# Patient Record
Sex: Male | Born: 1955 | Race: White | Hispanic: No | Marital: Married | Smoking: Former smoker
Health system: Southern US, Community
[De-identification: ages and names within clinical notes are randomized; demographics above are authoritative.]

## PROBLEM LIST (undated history)

## (undated) DIAGNOSIS — Z978 Presence of other specified devices: Secondary | ICD-10-CM

## (undated) DIAGNOSIS — G4700 Insomnia, unspecified: Secondary | ICD-10-CM

## (undated) DIAGNOSIS — K219 Gastro-esophageal reflux disease without esophagitis: Secondary | ICD-10-CM

## (undated) DIAGNOSIS — E785 Hyperlipidemia, unspecified: Secondary | ICD-10-CM

## (undated) DIAGNOSIS — N401 Enlarged prostate with lower urinary tract symptoms: Secondary | ICD-10-CM

## (undated) DIAGNOSIS — R338 Other retention of urine: Secondary | ICD-10-CM

## (undated) DIAGNOSIS — I2510 Atherosclerotic heart disease of native coronary artery without angina pectoris: Secondary | ICD-10-CM

## (undated) DIAGNOSIS — I10 Essential (primary) hypertension: Secondary | ICD-10-CM

## (undated) DIAGNOSIS — C679 Malignant neoplasm of bladder, unspecified: Secondary | ICD-10-CM

## (undated) DIAGNOSIS — Z960 Presence of urogenital implants: Secondary | ICD-10-CM

## (undated) DIAGNOSIS — E119 Type 2 diabetes mellitus without complications: Secondary | ICD-10-CM

## (undated) DIAGNOSIS — Z951 Presence of aortocoronary bypass graft: Secondary | ICD-10-CM

## (undated) HISTORY — DX: Insomnia, unspecified: G47.00

## (undated) HISTORY — PX: CORONARY ARTERY BYPASS GRAFT: SHX141

## (undated) HISTORY — DX: Atherosclerotic heart disease of native coronary artery without angina pectoris: I25.10

## (undated) HISTORY — PX: CARDIAC CATHETERIZATION: SHX172

## (undated) HISTORY — DX: Essential (primary) hypertension: I10

## (undated) HISTORY — DX: Hyperlipidemia, unspecified: E78.5

## (undated) HISTORY — DX: Gastro-esophageal reflux disease without esophagitis: K21.9

## (undated) HISTORY — PX: TONSILLECTOMY: SUR1361

---

## 2002-02-10 ENCOUNTER — Encounter: Admission: RE | Admit: 2002-02-10 | Discharge: 2002-02-10 | Payer: Self-pay | Admitting: Internal Medicine

## 2002-02-10 ENCOUNTER — Encounter: Payer: Self-pay | Admitting: Internal Medicine

## 2003-10-26 ENCOUNTER — Inpatient Hospital Stay (HOSPITAL_COMMUNITY): Admission: AD | Admit: 2003-10-26 | Discharge: 2003-11-02 | Payer: Self-pay | Admitting: Cardiology

## 2003-11-22 ENCOUNTER — Encounter
Admission: RE | Admit: 2003-11-22 | Discharge: 2003-11-22 | Payer: Self-pay | Admitting: Thoracic Surgery (Cardiothoracic Vascular Surgery)

## 2003-11-29 ENCOUNTER — Encounter (HOSPITAL_COMMUNITY): Admission: RE | Admit: 2003-11-29 | Discharge: 2004-02-27 | Payer: Self-pay | Admitting: Cardiology

## 2006-02-27 ENCOUNTER — Emergency Department (HOSPITAL_COMMUNITY): Admission: EM | Admit: 2006-02-27 | Discharge: 2006-02-27 | Payer: Self-pay | Admitting: Emergency Medicine

## 2006-08-07 ENCOUNTER — Encounter: Admission: RE | Admit: 2006-08-07 | Discharge: 2006-08-07 | Payer: Self-pay | Admitting: *Deleted

## 2006-09-10 ENCOUNTER — Encounter: Admission: RE | Admit: 2006-09-10 | Discharge: 2006-09-10 | Payer: Self-pay | Admitting: *Deleted

## 2010-08-11 ENCOUNTER — Ambulatory Visit: Payer: Self-pay | Admitting: Vascular Surgery

## 2010-08-18 ENCOUNTER — Ambulatory Visit: Admit: 2010-08-18 | Discharge: 2010-08-18 | Payer: Self-pay | Attending: Vascular Surgery | Admitting: Vascular Surgery

## 2010-08-18 ENCOUNTER — Ambulatory Visit: Admit: 2010-08-18 | Payer: Self-pay | Admitting: Vascular Surgery

## 2010-09-03 ENCOUNTER — Encounter: Payer: Self-pay | Admitting: *Deleted

## 2010-12-29 NOTE — Op Note (Signed)
NAME:  Alexander Wolf, Alexander Wolf NO.:  [id number]   MEDICAL RECORD NO.:  [id number]                   PATIENT TYPE:  INP   LOCATION:  2305                                 FACILITY:  [facility]   PHYSICIAN:  Salvatore Decent. Cornelius Moras, M.D.              DATE OF BIRTH:  1955-12-20   DATE OF PROCEDURE:  10/28/2003  DATE OF DISCHARGE:                                 OPERATIVE REPORT   PREOPERATIVE DIAGNOSIS:  Severe three-vessel coronary artery disease with  class IV angina.   POSTOPERATIVE DIAGNOSIS:  Severe three-vessel coronary artery disease with  class IV angina.   OPERATION PERFORMED:  Median sternotomy for coronary artery bypass grafting  times four (left internal mammary artery to distal left anterior descending  coronary artery, right internal mammary artery to first circumflex marginal  branch, saphenous vein graft to second circumflex marginal branch, saphenous  vein graft to right posterolateral branch, endoscopic saphenous vein harvest  from right thigh).   SURGEON:  Salvatore Decent. Cornelius Moras, M.D.   ASSISTANT:  Coral Ceo, P.A.   ANESTHESIA:  General.   INDICATIONS FOR PROCEDURE:  The patient is a 55 year old male with history  of hypertension and longstanding tobacco use as well as  hypercholesterolemia.  The patient presents with symptoms of chest pain  consistent with angina.  It occurred both with rest and with strenuous  physical activity.  The patient underwent a stress Cardiolite exam which was  markedly abnormal, prompting cardiac catheterization which was performed by  Dr. Corliss Marcus.  This demonstrates critical three-vessel coronary artery  disease with preserved left ventricular function.  A full consultation note  has been dictated previously.   OPERATIVE CONSENT:  The patient and his wife have been counseled at length  regarding the indications and potential benefits of coronary artery bypass  grafting.  Alternative treatment strategies have been  discussed.  They  understand and accept all associated risks of surgery including but not  limited to risks of death, stroke, myocardial infarction, congestive heart  failure, respiratory failure, pneumonia, bleeding requiring blood  transfusion, arrhythmia, infection, and recurrent coronary artery disease.  All of their questions have been addressed.   DESCRIPTION OF PROCEDURE:  The patient was brought to the operating room on  the above mentioned date and central monitoring was established by the  anesthesia service under the care and direction of Sheldon Silvan, M.D.  Specifically, a Swann-Ganz catheter was placed in the right internal jugular  approach.  A radial arterial line was placed.  Intravenous antibiotics were  administered.  Following induction with general endotracheal anesthesia, a  Foley catheter was placed.   Baseline transesophageal echocardiogram was performed by Dr. Ivin Booty.  This  demonstrated the presence of essentially normal left ventricular function  with very mild inferior wall hypokinesis.  Of note, the mitral valve appears  normal with only trace mitral regurgitation.  There is no evidence  for  mitral valve prolapse.   The patient's chest, abdomen, both groins and both lower extremities were  prepped and draped in the usual sterile manner.  A median sternotomy  incision was performed and the left internal mammary artery was dissected  from the chest wall and prepared for bypass grafting.  The lima was good  quality conduit.  Following this, the right internal mammary artery was also  dissected from the chest wall and prepared for bypass grafting.  This  specimen was also a good quality conduit.  Simultaneously, a saphenous vein  was obtained from the patient's right thigh using endoscopic vein harvest  technique. The saphenous vein was good quality conduit.  The patient was  heparinized systemically.   The pericardium was opened.  The ascending aorta was normal  in appearance.  The ascending aorta and right atrium were cannulated for cardiopulmonary  bypass.  Adequate heparinization was verified.  Cardiopulmonary bypass was  begun and the surface of the heart was inspected.  There was mild to  moderate left ventricular hypertrophy. There was diffuse coronary artery  disease with visible and palpable plaque throughout the majority of the  epicardial coronary arteries.  Portions of saphenous vein and the left  internal mammary artery and right internal mammary artery were all trimmed  to appropriate length.  A temperature probe was placed in the left  ventricular septum.  A cardioplegia catheter was placed in the ascending  aorta.  The patient was cooled to 32 degrees systemic temperature.  The  aortic cross-clamp was applied and cardioplegia was delivered initially in  an antegrade fashion through the aortic root.  Iced saline slush was applied  for topical hypothermia.  The initial cardioplegic arrest and myocardial  cooling were somewhat slow.  Subsequently, a retrograde cardioplegia  catheter was placed through the right atrium into the coronary sinus and  additional cardioplegia was administered retrograde.  With this  satisfactory, myocardial cooling was ascertained.  Repeat doses of  cardioplegia were administered intermittently throughout the cross-clamp  portion of the operation both through the aortic root, down subsequently  placed vein grafts, and retrograde through the coronary sinus catheter to  maintain septal temperature below 15 degrees centigrade.  The following  distal coronary anastomoses were performed:  (1)  The posterolateral branch  off the distal right coronary artery was grafted with a saphenous vein graft  in end-to-side fashion.  This coronary artery measures 1.1 mm in diameter at  the site of distal bypass and is of fair to poor quality.  Prior to  grafting, the posterior descending coronary artery was opened but  this vessel was notably diffusely diseased and essentially completely occluded  and there was no suitable site for grafting and carotid endarterectomy was  felt not to be feasible or appropriate under the circumstances.  The  posterior descending coronary artery was therefore not grafted.  (2)  The  second circumflex marginal branch was grafted with saphenous vein graft in  end-to-side fashion.  This coronary measured 1.4 mm in diameter and was of  fair quality.  (3)  The first circumflex marginal branch was grafted with  the right internal mammary artery in end-to-side fashion.  The right  internal mammary artery was utilized as an in situ graft with the pedicle  placed posterior through the aorta through the transverse sinus.  The first  circumflex marginal branch measured 1.7 mm in diameter and was of fair  quality at the site of distal bypass.  There was diffuse plaque within this  vessel.  On the patient's original cardiac catheterization films, this  vessel essentially arises from the bifurcation of the left main coronary  artery and could conceivably be termed a ramus intermediate branch.  However, there was another large vessel somewhat more medially that arises  at the same location.  Therefore, this vessel was called the first  circumflex marginal branch or the lateral most of the intermediate branches.  (4)  The distal left anterior descending coronary artery was grafted with  left internal mammary artery in end-to-side fashion.  This coronary measured  1.7 mm in diameter and was of fair quality at the site of distal bypass.  There was diffuse plaque within this vessel also.  Both proximal saphenous  vein anastomoses were performed directly to the ascending aorta prior to  removal of the aortic cross-clamp.  All air was evacuated from the aortic  root.  The septal temperature was noted to rise appropriately following  reperfusion of the internal mammary artery graft.  The aortic  cross-clamp  was removed after a total cross-clamp time of 111 minutes.  The heart began  to beat spontaneously without need for cardioversion.  All proximal and  distal anastomoses were inspected for hemostasis and appropriate graft  orientation.  Epicardial pacing wires were fixed to the right ventricular  outflow tract and to the right atrial appendage.  The patient was rewarmed  to 37 degrees centigrade temperature.  The patient was weaned from  cardiopulmonary bypass without difficulty.  The patient's rhythm at  separation from bypass is normal sinus rhythm.  Atrial pacing was employed  to increase the heart rate.  The inferior wall looked a little bit sluggish  on transesophageal echocardiogram after initial separation from bypass, and  low dose dopamine was begun.  The patient remained hemodynamically stable  throughout the remainder of the postbypass portion of the operation. Total  cardiopulmonary bypass time for the operation was 140 minutes.  Follow-up transesophageal echocardiogram confirmed essentially no significant changes  from preoperatively and left ventricular function continued to improve  throughout the remainder of the post bypass portion of the operation.   The venous and arterial cannulae were removed uneventfully.  Protamine was  administered to reverse the anticoagulation. The mediastinum and both left  and right pleural spaces were irrigated with saline solution containing  vancomycin.  Meticulous surgical hemostasis was ascertained.  The  mediastinum and both left and right pleural spaces were drained with four  chest tubes placed through separate stab incisions inferiorly.  The median  sternotomy was closed in routine fashion. The right lower extremity incision  was closed in multiple layers in routine fashion.  The soft tissues anterior  to the sternum were closed in multiple layers and all skin incisions were  closed with subcuticular skin closures.   The  patient tolerated the procedure well and was transported to the surgical  intensive care unit in stable condition.  There were no intraoperative  complications.  All sponge, needle and instrument counts were verified  correct at completion of the operation.  No blood products were  administered.                                               Salvatore Decent. Cornelius Moras, M.D.    CHO/MEDQ  D:  10/28/2003  T:  11/01/2003  Job:  [id number]   cc:   Francisca December, M.D.  [address]  [address]  [place] [address]  Fax: [phone]   Lavonda Jumbo, M.D.  Fax: [phone]

## 2010-12-29 NOTE — Op Note (Signed)
NAME:  JIBRI, SCHRIEFER NO.:  [id number]   MEDICAL RECORD NO.:  [id number]                   PATIENT TYPE:  INP   LOCATION:  2030                                 FACILITY:  [facility]   PHYSICIAN:  Sheldon Silvan, M.D.                   DATE OF BIRTH:  Dec 24, 1955   DATE OF PROCEDURE:  10/29/2003  DATE OF DISCHARGE:                                 OPERATIVE REPORT   PROCEDURE:  Interoperative transesophageal echocardiogram (TEE).   Mr. Slabach was brought to the operating room today by Dr. Cornelius Moras for coronary  artery bypass grafting.  It was felt that use of TEE interoperatively would  be appropriate for both diagnostic and therapeutic purposes.  There was some  question of mitral valve prolapse in a history context.  The patient was  brought to the operating room after appropriate invasive monitoring had been  obtained.  General anesthesia was induced and he was intubated  endotracheally.  The mouth was examined at that time and found to be free of  injury.  The [employer] TEE probe was lubricated and inserted  into a covering sheath and the outside of the sheath was also lubricated.  The scope was passed on one attempt easily through the oropharynx.  There  was no trauma noted.  The heart was seen immediately at 0 degrees in a four  chamber view in a long axis.  The mitral valve was carefully seen and  interrogated and the leaflets opposed each other normally.  There was no  prolapse noted.  There was trace mitral regurgitation noted on color flow  exam in the central area of the valve.  The left ventricle was examined at  both the basilar and mid portion and it was felt that there was moderate  hypertrophy concentrically.  The inferior wall was somewhat sluggish  compared to the other three segments.  The aortic valve was examined and  found to be tricuspid in nature.  The cusps were thin with only minimal  sclerotic edges noted.  The valve showed no  aortic regurgitation in the long  axis view on color flow exam.  The intra-atrial septum was examined and  there was no PFO noted.  The right atrium and right ventricle were examined  and felt to be normal.  The tricuspid valve was normal with trace to no  regurgitation noted.  The left atrial appendage showed no thrombus.   The patient was placed on cardiopulmonary bypass and coronary artery bypass  grafting was performed by Dr. Cornelius Moras.   On weaning the patient from the bypass machine, the heart was filled and  under TEE imaging, the fluid level in the LV cavity was monitored closely.  In addition, the sluggishness of the inferior and possibly inferolateral  walls were noted.  After several minutes and after beginning Dopamine at a  low  dose of 5 mcg per kg per minute, the contractility of the aforementioned  areas improved quite a bit and the blood pressure was stable.  The  examination of the valves was repeated and found to be unchanged.  The  patient continued to do well and was transferred to the SICU in good  condition.  The probe was removed without difficulty.                                               Sheldon Silvan, M.D.    DC/MEDQ  D:  10/29/2003  T:  11/01/2003  Job:  [id number]

## 2010-12-29 NOTE — Consult Note (Signed)
NAME:  SHEP, PORTER                              ACCOUNT NO.:  [id number]   MEDICAL RECORD NO.:  [id number]                   PATIENT TYPE:  OIB   LOCATION:  4743                                 FACILITY:  [facility]   PHYSICIAN:  Salvatore Decent. Cornelius Moras, M.D.              DATE OF BIRTH:  1956/06/16   DATE OF CONSULTATION:  10/26/2003  DATE OF DISCHARGE:                                   CONSULTATION   REFERRING PHYSICIAN:  Dr. Francisca December.   PRIMARY CARE PHYSICIAN:  Dr. Lavonda Jumbo.   REASON FOR CONSULTATION:  Severe three-vessel coronary artery disease with  class IV angina.   HISTORY OF PRESENT ILLNESS:  Mr. Angell is a 55 year old gentleman from  [place] with no previous history of coronary artery disease, but risk  factors including history of hypertension, hyperlipidemia, and longstanding  tobacco use.  The patient describes a several-year history of atypical chest  pain which has progressed in recent months.  He describes burning chest  pain, more so on the left than the right, but bilateral.  Initially, the  pain seemed to be related with strenuous activity or sexual activity, but  more recently has become more sporadic and occurs at rest and associated  with meals.  He has had one such episode awakening him from his sleep  recently.  He has had accelerating severity of episodes of chest pain with  more prolonged episodes recently as well.  The pain is not associated with  shortness of breath, nausea or diaphoresis, although when the pain gets  severe, it tends to radiate to both arms and shoulders.  He presented to Dr.  Lynelle Doctor and described these symptoms and was subsequently referred to Dr.  Amil Amen for further evaluation.  He underwent a stress Cardiolite exam as an  outpatient, and this test was severely abnormal with ST segment changes  during the stress and a large area of intra-apical ischemia as well as  smaller area of inferior ischemia documented on Cardiolite imaging.   The  resting ejection fraction was reportedly 30%.  Mr. Strohm was brought in for  elective cardiac catheterization today by Dr. Ty Hilts.  This demonstrates  severe three-vessel coronary artery disease with mild left ventricular  dysfunction.  Percutaneous coronary intervention was felt not a favorable  alternative and cardiac surgical consultation is requested to consider  coronary artery bypass grafting.   REVIEW OF SYSTEMS:  GENERAL:  The patient reports feeling well otherwise.  He has a good appetite and has not been gaining or losing weight.  CARDIAC:  Notable for atypical symptoms of chest pain as described.  His episodes of  pain occurred both at rest and with activity, but they have been  accelerating in frequency and severity.  He denies any exertional shortness  of breath, resting shortness of breath, PND, orthopnea, or lower extremity  edema.  He denies any palpitations  or syncope.  RESPIRATORY:  Notable for  the absence of shortness of breath, productive cough, hemoptysis, wheezing.  GASTROINTESTINAL:  Negative and notable for the absence of difficulty  swallowing.  The patient denies a history of hematochezia, hematemesis or  melena.  MUSCULOSKELETAL:  Negative.  The patient denies problems with  arthritis or arthralgias.  NEUROLOGIC:  Negative.  The patient denies  symptoms suggestive of previous TIA or stroke.  GENITOURINARY:  Negative.  The patient denies urinary urgency, frequency or difficulty urinating.  PSYCHIATRIC:  Negative.  The patient denies problems with anxiety or  depression, although initially he felt some of his symptoms of chest pain  might be related to anxiety.  HEENT:  Negative.  The patient did chip a  tooth recently, but otherwise he denies any loose teeth or painful teeth.  He reports that his eyesight has been slowly deteriorating over the last  several years, but there have been no sudden changes.  He denies any  episodes of transient monocular  blindness.  ENDOCRINE:  Negative, although  the patient does report a history of hyperlipidemia.  He denies symptoms  suggestive of diabetes.   PAST MEDICAL HISTORY:  1. Hypertension.  2. Longstanding tobacco use.  3. ? Mitral valve prolapse.   PAST SURGICAL HISTORY:  Tonsillectomy in the remote past.   FAMILY HISTORY:  Family history is notable for the absence of premature  coronary artery disease.   SOCIAL HISTORY:  The patient is married and lives with his wife.  They have  several children, although all of their children are from previous marriages  and none of their children live at home at present with them.  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  Her has a longstanding history of tobacco use, smoking greater than 1  pack of cigarettes per day for more than 15 years.  He denies a history of  excessive alcohol consumption.   CURRENT MEDICATIONS:  1. Lisinopril 10 mg daily.  2. Aspirin 325 mg daily.   DRUG ALLERGIES:  None known.   PHYSICAL EXAMINATION:  GENERAL:  Physical exam is notable for a well-  appearing male who appears his stated age in no acute distress.  VITAL SIGNS:  He is currently afebrile and normotensive, in sinus rhythm on  telemetry monitor.  HEENT:  Exam essentially within normal limits.  NECK:  The neck is supple.  There is no cervical nor supraclavicular  lymphadenopathy.  There is no jugular venous distention.  No carotid bruits  are noted.  Auscultation of the chest demonstrates clear and symmetrical  breath sounds bilaterally.  No wheezes or rhonchi demonstrated.  CARDIOVASCULAR:  Exam includes regular rate and rhythm.  I do not appreciate  any murmurs, rubs or extra heart sounds.  ABDOMEN:  The abdomen is soft and nontender.  The liver edge is not  palpable.  Bowel sounds are present. EXTREMITIES:  The extremities are warm and well-perfused.  There is no lower  extremity edema.  Distal pulses  are easily palpable in both lower legs at  the ankle.  There is no lower extremity edema.  There is no sign of venous  insufficiency.  RECTAL AND GU:  Exams are both deferred.  NEUROLOGICAL:  Examination is grossly nonfocal and symmetrical throughout.   DIAGNOSTIC TESTS:  Cardiac catheterization performed by Dr. Amil Amen today  has been reviewed.  This demonstrates critical three-vessel coronary artery  disease with mild  left ventricular dysfunction.  Specifically, there is 95%  stenosis of the mid left anterior descending coronary artery.  There are 2  ramus intermediate branches.  There is 60% to 70% proximal stenosis of the  large lateral ramus intermediate branch.  There is 40% to 50% proximal  stenosis of the more medial branch.  There is a 100% occlusion of the left  circumflex coronary artery with left-to-left collateral filling of the  circumflex marginal branch.  There is 90% proximal stenosis of the right  coronary artery and 100% occlusion of the mid right coronary artery.  There  is left-to-right collateral filling of the distal right coronary  circulation.  Left ventricular function is mildly reduced with ejection  fraction estimated 55% and mild inferior wall hypokinesis.  There is no sign  of mitral regurgitation.   IMPRESSION:  Severe three-vessel coronary artery disease with atypical chest  pain consistent with class IV angina.  There is mild left ventricular  dysfunction.  I believe that Mr. Cott would best be treated by elective  coronary artery bypass grafting.   PLAN:  I have outlined options at length with Mr. Ericsson and his wife.  Alternative treatment strategies have been discussed.  They understand and  accept all associated risks of surgery including, but not limited to, risks  of death, stroke, myocardial infarction, congestive heart failure,  respiratory failure, pneumonia, bleeding requiring blood transfusion,  arrhythmia, infection, and recurrent coronary  artery disease.  All of his  questions have been addressed.  We tentatively plan to proceed with surgery  on Thursday, October 28, 2003.                                               Salvatore Decent. Cornelius Moras, M.D.    CHO/MEDQ  D:  10/26/2003  T:  10/29/2003  Job:  [id number]   cc:   Francisca December, M.D.  [address]  [address]  [place] [address]  Fax: [phone]   Lavonda Jumbo, M.D.  Fax: [phone]

## 2010-12-29 NOTE — Op Note (Signed)
NAME:  KENGO, STURGES NO.:  [id number]   MEDICAL RECORD NO.:  [id number]                   PATIENT TYPE:  INP   LOCATION:  2030                                 FACILITY:  [facility]   PHYSICIAN:  Sheldon Silvan, M.D.                   DATE OF BIRTH:  Jun 01, 1956   DATE OF PROCEDURE:  DATE OF DISCHARGE:                                 OPERATIVE REPORT   Audio too short to transcribe (less than 5 seconds)                                               Sheldon Silvan, M.D.    DC/MEDQ  D:  10/29/2003  T:  10/29/2003  Job:  [id number]

## 2010-12-29 NOTE — Cardiovascular Report (Signed)
NAME:  Alexander, Wolf NO.:  [id number]   MEDICAL RECORD NO.:  [id number]                   PATIENT TYPE:  OIB   LOCATION:  4743                                 FACILITY:  [facility]   PHYSICIAN:  Francisca December, M.D.               DATE OF BIRTH:  [birth year]   DATE OF PROCEDURE:  10/26/2003  DATE OF DISCHARGE:                              CARDIAC CATHETERIZATION   PROCEDURES PERFORMED:  1. Left heart catheterization.  2. Coronary angiography.  3. Left ventriculogram.   CARDIOLOGIST:  Francisca December, M.D.   INDICATIONS:  Alexander Wolf is a 55 year old man who presents to our office  with episodes of rather typical angina both at rest and with exertion.  He  underwent an exercise treadmill test that was positive for reproduction of  his angina pectoris and ischemic electrocardiographic changes.  His  perfusion images revealed reversible inferior and anterior defects.  He is  brought now to the catheterization laboratory to identify the extent of his  disease and provide for further therapeutic options.   DESCRIPTION OF PROCEDURE:  The patient was brought to the cardiac  catheterization laboratory in the fasting state.  The right groin was  prepped and draped in the  usual sterile fashion.  Local anesthesia was  obtained with the infiltration of 1% lidocaine.  A 5 French catheter sheath  was inserted percutaneously into the right femoral artery utilizing an  anterior approach over a guiding J-wire.  A 110 cm pigtail catheter was used  to measure pressures in the ascending aorta and in the left ventricle both  prior to and following the ventriculogram.  A 30 degree RAO cine left  ventriculogram was performed utilizing a power injector.  Following the  sublingual administration of 0.4 mg of nitroglycerin, cine angiography of  each coronary artery was conducted in multiple LAO and RAO projections  utilizing 5 [place] #4 left and right Judkins catheters.  At the  completion  of the procedure, the catheter and catheter sheaths were removed.  Hemostasis was achieved by direct pressure.  The patient was transported to  the recovery area in stable condition with an intact distal pulse.   RESULTS:  HEMODYNAMICS:  The systemic arterial pressure was 112.75 with a  mean of 95 mmHg.  There was no systolic gradient across the aortic valve.  The left ventricular end-diastolic pressure was 12 mmHg preventriculogram.   ANGIOGRAPHY:  LEFT VENTRICULOGRAM:  The left ventriculogram demonstrated normal chamber  size and normal global systolic function with a visually estimated ejection  fraction of 55-60%.  There is inferobasal to mid severe hypokinesis.  There  is no significant mitral regurgitation.  There is right and left coronary  calcification seen.  There is a trileaflet aortic valve that is opening  normally during systole.   There was a right dominant coronary system present.   The main left  coronary artery was normal.   The left anterior descending artery and its branches were highly diseased;  the vessel contains a mid focal stenosis of 99%.  There is some diffuse  tubular stenosis of 50% proximal to this.  The lesion is after the origin of  three septal perforators.  The second of these is quite large and provides  extensive collaterals via the septal arcade to the posterior descending  artery.  The ongoing anterior descending artery reaches and traverses the  apex.  It is diffusely diseased but without significant obstruction.   The left circumflex coronary artery and its branches are highly diseased;  there is a ramus intermedius branch that is large and without significant  obstruction.  The first marginal branch is large and without significant  obstruction.  It is diffusely diseased.  There is a 30% stenosis at its  origin.  The ongoing circumflex is 100% occluded in the mid to distal  segment at the origin of the ongoing circumflex and a  bifurcating second  marginal.  The vessel does appear to trifurcate at this point.  There are  faint left-to-left collaterals filling these branches.   The right coronary artery and its branches are highly diseased; the vessels  are completely occluded in the mid portion after the origin of a large right  ventricular branch.  There is an 80% stenosis at the junction of the  proximal and mid segments.  This is a diffuse stenosis that reaches down to  the near the point of complete occlusion.   Collaterals vessels are seen as mentioned above to the distal left  circumflex and to the posterior descending as well as posterior lateral  segment.  There is retrograde filling into the distal portion of the right  coronary artery seen.   FINAL IMPRESSION:  1. Atherosclerotic cardiovascular disease, severe three vessel.  2. Intact left ventricular size and global systolic function.  Regional wall     motion abnormality is noted.  3. Normal left ventricular end-diastolic pressure.   PLAN/RECOMMENDATIONS:  The patient will be referred for coronary artery  bypass graft surgery.                                               Francisca December, M.D.    JHE/MEDQ  D:  10/26/2003  T:  10/27/2003  Job:  [id number]   cc:   Newton Pigg, M.D.

## 2010-12-29 NOTE — Discharge Summary (Signed)
NAME:  Alexander, Wolf NO.:  [id number]   MEDICAL RECORD NO.:  [id number]                   PATIENT TYPE:  INP   LOCATION:  2030                                 FACILITY:  [facility]   PHYSICIAN:  Salvatore Decent. Cornelius Moras, M.D.              DATE OF BIRTH:  1956/02/11   DATE OF ADMISSION:  10/26/2003  DATE OF DISCHARGE:  11/02/2003                                 DISCHARGE SUMMARY   HISTORY OF PRESENT ILLNESS:  Alexander Wolf is a 55 year old man who has a  history of recent atypical chest pain.  The symptoms were noted when he was  drinking or eating, especially anything that was noted to be cold.  He also  had episodes when he was startled, such as a phone ringing or the knock of a  door.  The patient does have a history of hypertension, but has not been on  his blood pressure medication for approximately a year.  He noted the  symptoms were worse after discontinuation of his lisinopril.  The patient  associated some of his symptoms with diaphoresis, as well as nausea and some  dyspnea.  These symptoms occurred also with activity.  The pain was in his  left chest and did not seem to radiate.  He was seen by Dr. Lynelle Doctor and  significant change to his EKG was noted and he was referred for further  cardiology evaluation by Dr. Amil Amen.   PAST MEDICAL HISTORY:  1. Hypertension.  2. Possible mitral valve prolapse.   PAST SURGICAL HISTORY:  Remote tonsillectomy.   SOCIAL HISTORY:  He is married with no children.  He is a Psychologist, occupational at [employer].  He uses one pack of cigarettes per day for approximately 15  years.  Alcohol use is rare.  Caffeine use is moderate.  Recreational drug  use:  None.   MEDICATIONS ON ADMISSION:  1. Lisinopril 10 mg daily.  2. Nitrostat 0.4 mg p.r.n. (which was not used).  3. Aspirin 325 mg daily.   ALLERGIES:  No known allergies.   REVIEW OF SYSTEMS AND PHYSICAL EXAM:  Please see the history and physical at  the time of  admission.  Of note, the patient had an exercise treadmill test  associated with Cardiolite images, for which he exercises for 9 minutes, 36  seconds on a Bruce to 86% of maximum predicted.  His index symptoms were  reproduced.  His ST segments dropped to about 1 to 1.5 mm.  His treadmill  score was calculated at -6.  Cardiolite images showed a significant large  reversible anterior apical defect, indicative of likely LAD stenosis.  There  was also a partially reversible inferior basal defect and the ejection  fraction was only 30%.  Due to these findings, the patient was felt to  require admission for cardiac catheterization and further treatment plans.  He was admitted  on October 26, 2003 by Dr. Amil Amen and taken to the lab where  he was found to have an ejection fraction of 55 to 60%, inferior basilar  akinesis.  The LAD had a 99% stenosis,  the left circumflex had a 100%  stenosis, the right coronary artery had a 100% stenosis.  The patient did  fill the left via collaterals.  Due to the significant findings on this  study, cardiac surgical consultation was obtained by Dr. Tressie Stalker, who  evaluated the patient and studies and it was his impression that with severe  three vessel coronary artery disease and class IV angina with mild left  ventricular dysfunction, elective coronary artery bypass grafting was  recommended.  The patient was scheduled for surgery.   PROCEDURE:  On October 28, 2003, the patient was taken to the operating room,  where he underwent the following procedure:  Coronary artery bypass grafting  x 4.  The following grafts were placed:  1. Left anterior mammary artery to the LAD.  2. Saphenous vein graft to the posterior lateral.  3. Saphenous vein graft to the obtuse marginal #2.  4. Right internal mammary artery to the obtuse marginal.   Cross clamp time was 111 minutes.  Pump time was 140 minutes.  Of note, the  posterior descending artery was not graftable,  secondary to his diffuse  disease.  A preoperative transesophageal echocardiogram was notable for the  absence of mitral valve prolapse.  The patient required no blood product.  He was taken to the Surgical Intensive Care Unit in stable condition.   POSTOPERATIVE HOSPITAL COURSE:  The patient has done well.  All routine  lines, monitors, and drainage devices were discontinued in the standard  fashion.  The patient has remained hemodynamically stable.  Laboratory  values have remained stable with only a mild anemia.  The patient has  maintained a normal sinus rhythm.  He has been restarted on an ACE  inhibitor, as well as a statin, aspirin and beta blocker.  His incisions are  healing well without signs of infection.  He is tolerating routine advance  in activities, commensurate for level of postoperative convalescence, using  cardiac rehabilitation phase I modalities.  His overall status is felt to be  quite stable to tentative discharge on the morning of November 02, 2003,  pending morning round re-evaluation.   CONDITION ON DISCHARGE:  Stable, improving.   FINAL DIAGNOSES:  Severe three vessel coronary artery disease, as described,  with diagnoses of hypertension, long standing tobacco use,  hypercholesterolemia.   MEDICATIONS ON DISCHARGE:  1. Tylox one or two q.4-6 h. p.r.n. as needed for pain.  2. Lipitor 20 mg daily.  3. Lisinopril 10 mg daily.  4. Lopressor 25 mg twice daily.  5. Aspirin 325 mg daily.   INSTRUCTIONS:  The patient received written instructions with regard to  medication, activity, diet, wound care and followup.  Followup will include  Dr. Cornelius Moras on November 22, 2003 at 2:00 p.m.  The patient is instructed to make  an appointment to see Dr. Amil Amen in two weeks and to obtain a chest x-ray  at that time.      Rowe Clack, P.A.-C.                    Salvatore Decent. Cornelius Moras, M.D.   Sherryll Burger  D:  11/01/2003  T:  11/03/2003  Job:  [id number]   cc:   Francisca December, M.D.   [phone]  [address]  [place] [address]  Fax: [phone]   Salvatore Decent. Cornelius Moras, M.D.  [address]  [place]  [place] [address]   Lavonda Jumbo, M.D.  Fax: [phone]

## 2011-11-23 ENCOUNTER — Other Ambulatory Visit: Payer: Self-pay | Admitting: Vascular Surgery

## 2012-08-13 DIAGNOSIS — C679 Malignant neoplasm of bladder, unspecified: Secondary | ICD-10-CM

## 2012-08-13 HISTORY — PX: TRANSURETHRAL RESECTION OF BLADDER TUMOR: SHX2575

## 2012-08-13 HISTORY — DX: Malignant neoplasm of bladder, unspecified: C67.9

## 2014-04-04 ENCOUNTER — Ambulatory Visit (INDEPENDENT_AMBULATORY_CARE_PROVIDER_SITE_OTHER): Payer: 59 | Admitting: Family Medicine

## 2014-04-04 VITALS — BP 122/78 | HR 61 | Temp 98.2°F | Resp 17 | Ht 69.5 in | Wt 230.0 lb

## 2014-04-04 DIAGNOSIS — Z8551 Personal history of malignant neoplasm of bladder: Secondary | ICD-10-CM | POA: Insufficient documentation

## 2014-04-04 DIAGNOSIS — I10 Essential (primary) hypertension: Secondary | ICD-10-CM | POA: Insufficient documentation

## 2014-04-04 DIAGNOSIS — IMO0002 Reserved for concepts with insufficient information to code with codable children: Secondary | ICD-10-CM

## 2014-04-04 DIAGNOSIS — K219 Gastro-esophageal reflux disease without esophagitis: Secondary | ICD-10-CM

## 2014-04-04 DIAGNOSIS — E1165 Type 2 diabetes mellitus with hyperglycemia: Secondary | ICD-10-CM

## 2014-04-04 DIAGNOSIS — E785 Hyperlipidemia, unspecified: Secondary | ICD-10-CM | POA: Insufficient documentation

## 2014-04-04 DIAGNOSIS — E118 Type 2 diabetes mellitus with unspecified complications: Secondary | ICD-10-CM

## 2014-04-04 DIAGNOSIS — E119 Type 2 diabetes mellitus without complications: Secondary | ICD-10-CM | POA: Insufficient documentation

## 2014-04-04 DIAGNOSIS — I25810 Atherosclerosis of coronary artery bypass graft(s) without angina pectoris: Secondary | ICD-10-CM | POA: Insufficient documentation

## 2014-04-04 LAB — LIPID PANEL
Cholesterol: 159 mg/dL (ref 0–200)
HDL: 39 mg/dL — ABNORMAL LOW (ref >39)
LDL Cholesterol: 81 mg/dL (ref 0–99)
Total CHOL/HDL Ratio: 4.1 Ratio
Triglycerides: 193 mg/dL — ABNORMAL HIGH (ref <150)
VLDL: 39 mg/dL (ref 0–40)

## 2014-04-04 LAB — POCT CBC
Granulocyte percent: 62 %G (ref 37–80)
HCT, POC: 48.6 % (ref 43.5–53.7)
Hemoglobin: 16 g/dL (ref 14.1–18.1)
Lymph, poc: 3.2 (ref 0.6–3.4)
MCH, POC: 28.8 pg (ref 27–31.2)
MCHC: 32.9 g/dL (ref 31.8–35.4)
MCV: 87.7 fL (ref 80–97)
MID (cbc): 0.2 (ref 0–0.9)
MPV: 8.9 fL (ref 0–99.8)
POC Granulocyte: 5.5 (ref 2–6.9)
POC LYMPH PERCENT: 35.5 % (ref 10–50)
POC MID %: 2.5 %M (ref 0–12)
Platelet Count, POC: 228 10*3/uL (ref 142–424)
RBC: 5.53 M/uL (ref 4.69–6.13)
RDW, POC: 13.7 %
WBC: 8.9 10*3/uL (ref 4.6–10.2)

## 2014-04-04 LAB — COMPLETE METABOLIC PANEL WITHOUT GFR
Albumin: 4.7 g/dL (ref 3.5–5.2)
CO2: 23 meq/L (ref 19–32)
Calcium: 9.5 mg/dL (ref 8.4–10.5)
Chloride: 101 meq/L (ref 96–112)
GFR, Est Non African American: >89 mL/min
Glucose, Bld: 286 mg/dL — ABNORMAL HIGH (ref 70–99)
Potassium: 4.5 meq/L (ref 3.5–5.3)
Sodium: 134 meq/L — ABNORMAL LOW (ref 135–145)
Total Protein: 7.1 g/dL (ref 6.0–8.3)

## 2014-04-04 LAB — POCT UA - MICROSCOPIC ONLY
Bacteria, U Microscopic: NEGATIVE
Casts, Ur, LPF, POC: NEGATIVE
Crystals, Ur, HPF, POC: NEGATIVE
Mucus, UA: POSITIVE
RBC, urine, microscopic: NEGATIVE
Yeast, UA: NEGATIVE

## 2014-04-04 LAB — POCT URINALYSIS DIPSTICK
Bilirubin, UA: NEGATIVE
Blood, UA: NEGATIVE
Glucose, UA: 500
Ketones, UA: NEGATIVE
Leukocytes, UA: NEGATIVE
Nitrite, UA: NEGATIVE
Protein, UA: 100
Spec Grav, UA: 1.025
Urobilinogen, UA: 0.2
pH, UA: 5.5

## 2014-04-04 LAB — TSH: TSH: 1.319 u[IU]/mL (ref 0.350–4.500)

## 2014-04-04 LAB — COMPLETE METABOLIC PANEL WITH GFR
ALT: 11 U/L (ref 0–53)
AST: 14 U/L (ref 0–37)
Alkaline Phosphatase: 104 U/L (ref 39–117)
BUN: 13 mg/dL (ref 6–23)
Creat: 0.65 mg/dL (ref 0.50–1.35)
GFR, Est African American: >89 mL/min
Total Bilirubin: 0.5 mg/dL (ref 0.2–1.2)

## 2014-04-04 LAB — POCT GLYCOSYLATED HEMOGLOBIN (HGB A1C): Hemoglobin A1C: 10.5

## 2014-04-04 MED ORDER — ZOLPIDEM TARTRATE 10 MG PO TABS: 10.0000 mg | ORAL_TABLET | Freq: Every evening | ORAL | Status: DC | PRN

## 2014-04-04 MED ORDER — ROSUVASTATIN CALCIUM 40 MG PO TABS: 40.0000 mg | ORAL_TABLET | Freq: Every day | ORAL | Status: DC

## 2014-04-04 MED ORDER — OMEPRAZOLE 40 MG PO CPDR: 40.0000 mg | DELAYED_RELEASE_CAPSULE | Freq: Every day | ORAL | Status: DC

## 2014-04-04 MED ORDER — METOPROLOL TARTRATE 50 MG PO TABS: ORAL_TABLET | ORAL | Status: DC

## 2014-04-04 MED ORDER — METFORMIN HCL 1000 MG PO TABS: 1000.0000 mg | ORAL_TABLET | Freq: Two times a day (BID) | ORAL | Status: DC

## 2014-04-04 MED ORDER — LISINOPRIL 20 MG PO TABS: 20.0000 mg | ORAL_TABLET | Freq: Every day | ORAL | Status: DC

## 2014-04-04 NOTE — Patient Instructions (Signed)
Diabetes and Standards of Medical Care Diabetes is complicated. You may find that your diabetes team includes a dietitian, nurse, diabetes educator, eye doctor, and more. To help everyone know what is going on and to help you get the care you deserve, the following schedule of care was developed to help keep you on track. Below are the tests, exams, vaccines, medicines, education, and plans you will need. HbA1c test This test shows how well you have controlled your glucose over the past 2-3 months. It is used to see if your diabetes management plan needs to be adjusted.   It is performed at least 2 times a year if you are meeting treatment goals.  It is performed 4 times a year if therapy has changed or if you are not meeting treatment goals. Blood pressure test  This test is performed at every routine medical visit. The goal is less than 140/90 mm Hg for most people, but 130/80 mm Hg in some cases. Ask your health care provider about your goal. Dental exam  Follow up with the dentist regularly. Eye exam  If you are diagnosed with type 1 diabetes as a child, get an exam upon reaching the age of 37 years or older and have had diabetes for 3-5 years. Yearly eye exams are recommended after that initial eye exam.  If you are diagnosed with type 1 diabetes as an adult, get an exam within 5 years of diagnosis and then yearly.  If you are diagnosed with type 2 diabetes, get an exam as soon as possible after the diagnosis and then yearly. Foot care exam  Visual foot exams are performed at every routine medical visit. The exams check for cuts, injuries, or other problems with the feet.  A comprehensive foot exam should be done yearly. This includes visual inspection as well as assessing foot pulses and testing for loss of sensation.  Check your feet nightly for cuts, injuries, or other problems with your feet. Tell your health care provider if anything is not healing. Kidney function test (urine  microalbumin)  This test is performed once a year.  Type 1 diabetes: The first test is performed 5 years after diagnosis.  Type 2 diabetes: The first test is performed at the time of diagnosis.  A serum creatinine and estimated glomerular filtration rate (eGFR) test is done once a year to assess the level of chronic kidney disease (CKD), if present. Lipid profile (cholesterol, HDL, LDL, triglycerides)  Performed every 5 years for most people.  The goal for LDL is less than 100 mg/dL. If you are at high risk, the goal is less than 70 mg/dL.  The goal for HDL is 40 mg/dL-50 mg/dL for men and 50 mg/dL-60 mg/dL for women. An HDL cholesterol of 60 mg/dL or higher gives some protection against heart disease.  The goal for triglycerides is less than 150 mg/dL. Influenza vaccine, pneumococcal vaccine, and hepatitis B vaccine  The influenza vaccine is recommended yearly.  It is recommended that people with diabetes who are over 24 years old get the pneumonia vaccine. In some cases, two separate shots may be given. Ask your health care provider if your pneumonia vaccination is up to date.  The hepatitis B vaccine is also recommended for adults with diabetes. Diabetes self-management education  Education is recommended at diagnosis and ongoing as needed. Treatment plan  Your treatment plan is reviewed at every medical visit. Document Released: 05/27/2009 Document Revised: 12/14/2013 Document Reviewed: 12/30/2012 [facility] Patient Information 2015 [place],  LL[employer]. This information is not intended to replace advice given to you by your health care provider. Make sure you discuss any questions you have with your health care provider.  

## 2014-04-04 NOTE — Progress Notes (Addendum)
Chief Complaint:  Chief Complaint  Patient presents with  . Establish Care    HPI: Alexander Wolf is a 58 y.o. male who is here for  Here to establish care DR Marry Guan at [facility], last saw him 2 months ago Last HbA1c was high < 10 He has had bladder cancer, dx in 06/2013, dx because he had blood in his urine, former smoking-followed by urologist, Jonette Eva MD , [facility], last saw him Jan-Sep 24, 2013 and had a procedure x 2, has not followed up with him, he is interested in seeing someone else. The catheter would have to stay in for a long time and a chemo wash. None of that happened, he states the catheter never stayed and he neer got a wash.  He quit smoking, his A1c worsened. He was diagnosed with DM about 4-5 years ago, no neuropathy, no hypoglycemia, does not measure sugars, when he did monitoring it was normal abut when he quit smoking and the trauma of the cancer was through eating, he used to eat honey ( but not anymore) He was dx with HTN when he had 7-8 years ago with 5 V CABG at age 53. He was folllowed by [facility] , he has not been seen by [facility] but has not been back.    FINAL IMPRESSION:  1. Atherosclerotic cardiovascular disease, severe three vessel.  2. Intact left ventricular size and global systolic function. Regional wall  motion abnormality is noted.  3. Normal left ventricular end-diastolic pressure.  PLAN/RECOMMENDATIONS: The patient will be referred for coronary artery  bypass graft surgery.  Past Medical History  Diagnosis Date  . Cancer   . Diabetes mellitus without complication   . GERD (gastroesophageal reflux disease)   . Hyperlipidemia   . Hypertension   . Insomnia    Past Surgical History  Procedure Laterality Date  . Coronary artery bypass graft    . Bladder surgery     History   Social History  . Marital Status: Single    Spouse Name: N/A    Number of Children: N/A  . Years of  Education: N/A   Social History Main Topics  . Smoking status: Never Smoker   . Smokeless tobacco: None  . Alcohol Use: No  . Drug Use: No  . Sexual Activity: No   Other Topics Concern  . None   Social History Narrative  . None   History reviewed. No pertinent family history. No Known Allergies Prior to Admission medications   Medication Sig Start Date End Date Taking? Authorizing Provider  aspirin 325 MG tablet Take 325 mg by mouth daily.   Yes Historical Provider, MD  lisinopril (PRINIVIL,ZESTRIL) 20 MG tablet Take 20 mg by mouth daily.   Yes Historical Provider, MD  metFORMIN (GLUCOPHAGE) 1000 MG tablet Take 1,000 mg by mouth 2 (two) times daily with a meal.   Yes Historical Provider, MD  metoprolol (LOPRESSOR) 50 MG tablet Take 50 mg by mouth 2 (two) times daily.   Yes Historical Provider, MD  Omega-3 Fatty Acids (FISH OIL) 300 MG CAPS Take 600 capsules by mouth.   Yes Historical Provider, MD  omeprazole (PRILOSEC) 40 MG capsule Take 40 mg by mouth daily.   Yes Historical Provider, MD  rosuvastatin (CRESTOR) 40 MG tablet Take 40 mg by mouth daily.   Yes Historical Provider, MD  zolpidem (AMBIEN) 5 MG tablet Take 10 mg by mouth at bedtime as needed for  sleep.   Yes Historical Provider, MD     ROS: The patient denies fevers, chills, night sweats, unintentional weight loss, chest pain, palpitations, wheezing, dyspnea on exertion, nausea, vomiting, abdominal pain, dysuria, hematuria, melena, numbness, weakness, or tingling.  All other systems have been reviewed and were otherwise negative with the exception of those mentioned in the HPI and as above.    PHYSICAL EXAM: Filed Vitals:   04/04/14 0830  BP: 122/78  Pulse: 61  Temp: 98.2 F (36.8 C)  Resp: 17   Filed Vitals:   04/04/14 0830  Height: 5' 9.5" (1.765 m)  Weight: 230 lb (104.327 kg)   Body mass index is 33.49 kg/(m^2).  General: Alert, no acute distress HEENT:  Normocephalic, atraumatic, oropharynx patent.  EOMI, PERRLA Cardiovascular:  Regular rate and rhythm, no rubs murmurs or gallops.  No Carotid bruits, radial pulse intact. No pedal edema.  Respiratory: Clear to auscultation bilaterally.  No wheezes, rales, or rhonchi.  No cyanosis, no use of accessory musculature GI: No organomegaly, abdomen is soft and non-tender, positive bowel sounds.  No masses. Skin: No rashes. Neurologic: Facial musculature symmetric. Psychiatric: Patient is appropriate throughout our interaction. Lymphatic: No cervical lymphadenopathy Musculoskeletal: Gait intact.   LABS: Results for orders placed in visit on 04/04/14  POCT URINALYSIS DIPSTICK      Result Value Ref Range   Color, UA yellow     Clarity, UA clear     Glucose, UA 500     Bilirubin, UA neg     Ketones, UA neg     Spec Grav, UA 1.025     Blood, UA neg     pH, UA 5.5     Protein, UA 100     Urobilinogen, UA 0.2     Nitrite, UA neg     Leukocytes, UA Negative    POCT UA - MICROSCOPIC ONLY      Result Value Ref Range   WBC, Ur, HPF, POC 0-1     RBC, urine, microscopic neg     Bacteria, U Microscopic neg     Mucus, UA pos     Epithelial cells, urine per micros 0-1     Crystals, Ur, HPF, POC neg     Casts, Ur, LPF, POC neg     Yeast, UA neg    POCT GLYCOSYLATED HEMOGLOBIN (HGB A1C)      Result Value Ref Range   Hemoglobin A1C 10.5    POCT CBC      Result Value Ref Range   WBC 8.9  4.6 - 10.2 K/uL   Lymph, poc 3.2  0.6 - 3.4   POC LYMPH PERCENT 35.5  10 - 50 %L   MID (cbc) 0.2  0 - 0.9   POC MID % 2.5  0 - 12 %M   POC Granulocyte 5.5  2 - 6.9   Granulocyte percent 62.0  37 - 80 %G   RBC 5.53  4.69 - 6.13 M/uL   Hemoglobin 16.0  14.1 - 18.1 g/dL   HCT, POC 48.6  43.5 - 53.7 %   MCV 87.7  80 - 97 fL   MCH, POC 28.8  27 - 31.2 pg   MCHC 32.9  31.8 - 35.4 g/dL   RDW, POC 13.7     Platelet Count, POC 228  142 - 424 K/uL   MPV 8.9  0 - 99.8 fL     EKG/XRAY:   Primary read interpreted by Dr. Marin Comment at  [facility].  ASSESSMENT/PLAN: Encounter Diagnoses  Name Primary?  . Coronary artery disease involving coronary bypass graft of native heart without angina pectoris Yes  . Essential hypertension   . Other and unspecified hyperlipidemia   . Type II or unspecified type diabetes mellitus with unspecified complication, uncontrolled   . Hx of bladder cancer   . Gastroesophageal reflux disease without esophagitis    Refilled meds Refer to [facility] for h.o bladder cancer Labs pending, f.u in 3 months or sooner pendingresults  Gross sideeffects, risk and benefits, and alternatives of medications d/w patient. Patient is aware that all medications have potential sideeffects and we are unable to predict every sideeffect or drug-drug interaction that may occur.  Ildefonso Keaney, [place], DO 04/04/2014 9:36 AM

## 2014-04-16 ENCOUNTER — Encounter: Payer: Self-pay | Admitting: Family Medicine

## 2014-09-20 ENCOUNTER — Other Ambulatory Visit: Payer: Self-pay | Admitting: Family Medicine

## 2014-09-21 ENCOUNTER — Other Ambulatory Visit: Payer: Self-pay | Admitting: Family Medicine

## 2014-09-22 NOTE — Telephone Encounter (Signed)
Please call to let him know I need to see him in the office, his ambien cannot be refilled until then. [facility] Dr Marin Comment

## 2014-09-24 ENCOUNTER — Ambulatory Visit (INDEPENDENT_AMBULATORY_CARE_PROVIDER_SITE_OTHER): Payer: 59 | Admitting: Physician Assistant

## 2014-09-24 VITALS — BP 120/80 | HR 60 | Temp 98.0°F | Resp 16 | Ht 69.25 in | Wt 228.0 lb

## 2014-09-24 DIAGNOSIS — G479 Sleep disorder, unspecified: Secondary | ICD-10-CM

## 2014-09-24 DIAGNOSIS — Z23 Encounter for immunization: Secondary | ICD-10-CM

## 2014-09-24 DIAGNOSIS — E119 Type 2 diabetes mellitus without complications: Secondary | ICD-10-CM

## 2014-09-24 LAB — CBC WITH DIFFERENTIAL/PLATELET
BASOS ABS: 0 10*3/uL (ref 0.0–0.1)
Basophils Relative: 0 % (ref 0–1)
EOS PCT: 2 % (ref 0–5)
Eosinophils Absolute: 0.2 10*3/uL (ref 0.0–0.7)
HCT: 48.2 % (ref 39.0–52.0)
HEMOGLOBIN: 16.3 g/dL (ref 13.0–17.0)
LYMPHS ABS: 3.9 10*3/uL (ref 0.7–4.0)
LYMPHS PCT: 40 % (ref 12–46)
MCH: 29.4 pg (ref 26.0–34.0)
MCHC: 33.8 g/dL (ref 30.0–36.0)
MCV: 86.8 fL (ref 78.0–100.0)
MPV: 10.7 fL (ref 8.6–12.4)
Monocytes Absolute: 0.9 10*3/uL (ref 0.1–1.0)
Monocytes Relative: 9 % (ref 3–12)
NEUTROS ABS: 4.8 10*3/uL (ref 1.7–7.7)
Neutrophils Relative %: 49 % (ref 43–77)
PLATELETS: 253 10*3/uL (ref 150–400)
RBC: 5.55 MIL/uL (ref 4.22–5.81)
RDW: 13.4 % (ref 11.5–15.5)
WBC: 9.7 10*3/uL (ref 4.0–10.5)

## 2014-09-24 LAB — COMPREHENSIVE METABOLIC PANEL
ALT: 11 U/L (ref 0–53)
AST: 15 U/L (ref 0–37)
Albumin: 4.6 g/dL (ref 3.5–5.2)
Alkaline Phosphatase: 111 U/L (ref 39–117)
BUN: 14 mg/dL (ref 6–23)
CALCIUM: 9.6 mg/dL (ref 8.4–10.5)
CO2: 29 mEq/L (ref 19–32)
CREATININE: 0.69 mg/dL (ref 0.50–1.35)
Chloride: 100 mEq/L (ref 96–112)
GLUCOSE: 276 mg/dL — AB (ref 70–99)
Potassium: 4.3 mEq/L (ref 3.5–5.3)
SODIUM: 136 meq/L (ref 135–145)
Total Bilirubin: 0.6 mg/dL (ref 0.2–1.2)
Total Protein: 7.6 g/dL (ref 6.0–8.3)

## 2014-09-24 LAB — POCT URINALYSIS DIPSTICK
BILIRUBIN UA: NEGATIVE
Ketones, UA: NEGATIVE
Leukocytes, UA: NEGATIVE
NITRITE UA: NEGATIVE
Protein, UA: 100
RBC UA: NEGATIVE
UROBILINOGEN UA: 0.2
pH, UA: 6

## 2014-09-24 LAB — POCT GLYCOSYLATED HEMOGLOBIN (HGB A1C): Hemoglobin A1C: 10.4

## 2014-09-24 LAB — POCT UA - MICROSCOPIC ONLY
BACTERIA, U MICROSCOPIC: NEGATIVE
CRYSTALS, UR, HPF, POC: NEGATIVE
Casts, Ur, LPF, POC: NEGATIVE
Mucus, UA: NEGATIVE
Yeast, UA: NEGATIVE

## 2014-09-24 LAB — MICROALBUMIN, URINE: Microalb, Ur: 35 mg/dL — ABNORMAL HIGH (ref <2.0)

## 2014-09-24 MED ORDER — GLIPIZIDE 5 MG PO TABS: 5.0000 mg | ORAL_TABLET | Freq: Every day | ORAL | Status: DC

## 2014-09-24 MED ORDER — PNEUMOCOCCAL VAC POLYVALENT 25 MCG/0.5ML IJ INJ: 0.5000 mL | INJECTION | INTRAMUSCULAR | Status: DC

## 2014-09-24 MED ORDER — ZOLPIDEM TARTRATE 10 MG PO TABS: 10.0000 mg | ORAL_TABLET | Freq: Every evening | ORAL | Status: DC | PRN

## 2014-09-24 MED ORDER — ROSUVASTATIN CALCIUM 40 MG PO TABS: 40.0000 mg | ORAL_TABLET | Freq: Every day | ORAL | Status: DC

## 2014-09-24 NOTE — Progress Notes (Signed)
09/24/2014 at 9:22 AM  Alexander Wolf / DOB: 1956/03/24 / MRN: [id number]  The patient has Coronary artery disease involving coronary bypass graft of native heart without angina pectoris; Essential hypertension; Other and unspecified hyperlipidemia; Type II or unspecified type diabetes mellitus with unspecified complication, uncontrolled; and Hx of bladder cancer on his problem list.  SUBJECTIVE  Chief compalaint: Medication Refill and Follow-up   History of present illness: Alexander Wolf is 59 y.o. well appearing male presenting for the follow up of diabetes and a refill of Ambien.    He is checking his BS twice a week when he comes home from work and it is averaging roughly 100.  He denies sock and glove parasthesia.  He endorses polyuria, and denies polydipsia.  He has not seen an eye doctor "in years."  He denies foot ulcers and reports his wife is a Marine scientist and checks his feet once weekly. He admits that his diet is poor, consisting of a [employer] burrito for breakfast, pudding for lunch, and a microwave meal for supper. He has been to diabetes education and feels that he know what to eat, but states "I'm relying on the medication" to bring his sugar down.  He is compliant with his medication regimen.  He has a history CAD S/P CABG for five arteries 8 years previous.    He would like a refill of Zolpidem today.  He denies any side effects of this medication.  When he is not using this he reports waking up "all the time" and fatigue throughout the day as a result.  He has tried Unisom and says it gives him a hangover.  He  has a past medical history of Cancer; Diabetes mellitus without complication; GERD (gastroesophageal reflux disease); Hyperlipidemia; Hypertension; and Insomnia.    He has a current medication list which includes the following prescription(s): aspirin, metformin, metoprolol, fish oil, omeprazole, rosuvastatin, glipizide, lisinopril, and zolpidem.  Mr. Doughtie has No Known Allergies.  He  reports that he has never smoked. He does not have any smokeless tobacco history on file. He reports that he does not drink alcohol or use illicit drugs. He  reports that he does not engage in sexual activity.  The patient  has past surgical history that includes Coronary artery bypass graft and Bladder surgery.  His family history is not on file.  Review of Systems  Constitutional: Negative for fever, chills and weight loss.  Eyes: Negative for blurred vision, double vision, photophobia, pain, discharge and redness.  Respiratory: Negative for cough and shortness of breath.   Cardiovascular: Negative for chest pain and palpitations.  Gastrointestinal: Negative for nausea, vomiting and abdominal pain.  Genitourinary: Positive for frequency. Negative for dysuria, urgency, hematuria and flank pain.  Musculoskeletal: Negative for myalgias.  Skin: Negative for itching and rash.  Neurological: Negative for dizziness, tingling, tremors, sensory change, focal weakness and headaches.    OBJECTIVE  His  height is 5' 9.25" (1.759 m) and weight is 228 lb (103.42 kg). His oral temperature is 98 F (36.7 C). His blood pressure is 120/80 and his pulse is 60. His respiration is 16 and oxygen saturation is 97%.  The patient's body mass index is 33.43 kg/(m^2).  Physical Exam  Constitutional: He is oriented to person, place, and time. He appears well-developed and well-nourished.  Cardiovascular: Normal rate, regular rhythm and normal heart sounds.   Respiratory: Effort normal and breath sounds normal.  GI: Soft. Bowel sounds are normal.  Musculoskeletal: Normal range of  motion.  Neurological: He is alert and oriented to person, place, and time.  Skin: Skin is warm and dry.  Psychiatric: He has a normal mood and affect.   Diabetic foot exam documented entirely elsewhere in the encounter and is negative.    Results for orders placed or performed in visit on 09/24/14 (from the past 24 hour(s))    POCT glycosylated hemoglobin (Hb A1C)     Status: None   Collection Time: 09/24/14  8:51 AM  Result Value Ref Range   Hemoglobin A1C 10.4   POCT urinalysis dipstick     Status: None   Collection Time: 09/24/14  8:51 AM  Result Value Ref Range   Color, UA yellow    Clarity, UA clear    Glucose, UA >=1000    Bilirubin, UA neg    Ketones, UA neg    Spec Grav, UA >=1.030    Blood, UA neg    pH, UA 6.0    Protein, UA 100    Urobilinogen, UA 0.2    Nitrite, UA neg    Leukocytes, UA Negative   POCT UA - Microscopic Only     Status: None   Collection Time: 09/24/14  8:51 AM  Result Value Ref Range   WBC, Ur, HPF, POC 0-3    RBC, urine, microscopic 0-1    Bacteria, U Microscopic neg    Mucus, UA neg    Epithelial cells, urine per micros 0-1    Crystals, Ur, HPF, POC neg    Casts, Ur, LPF, POC neg    Yeast, UA neg     ASSESSMENT & PLAN  Anthonie was seen today for medication refill and follow-up.  Diagnoses and all orders for this visit:  Type 2 diabetes mellitus without complication: Hemoglobin A1C remains elevated despite 1000 mg Metformin daily.  Will add a second agent at its lowest dose qam and patient agreed to avoid sweets.  Recheck in 2 months.   Orders: -     POCT glycosylated hemoglobin (Hb A1C) -     CBC with Differential/Platelet -     POCT urinalysis dipstick -     POCT UA - Microscopic Only -     Comprehensive metabolic panel -     Microalbumin, urine -     glipiZIDE (GLUCOTROL) 5 MG tablet; Take 1 tablet (5 mg total) by mouth daily with breakfast. -     rosuvastatin (CRESTOR) 40 MG tablet; Take 1 tablet (40 mg total) by mouth daily.  Sleep disturbance Orders: -     zolpidem (AMBIEN) 10 MG tablet; Take 1 tablet (10 mg total) by mouth at bedtime as needed for sleep.    The patient was advised to call or come back to clinic if he does not see an improvement in symptoms, or worsens with the above plan.   Philis Fendt, MHS, PA-C [facility] 09/24/2014 9:22 AM

## 2014-09-24 NOTE — Patient Instructions (Signed)
Take Tylenol 1000 mg twice daily for three days after receiving the flu shot.   Hypoglycemia Hypoglycemia occurs when the glucose in your blood is too low. Glucose is a type of sugar that is your body's main energy source. Hormones, such as insulin and glucagon, control the level of glucose in the blood. Insulin lowers blood glucose and glucagon increases blood glucose. Having too much insulin in your blood stream, or not eating enough food containing sugar, can result in hypoglycemia. Hypoglycemia can happen to people with or without diabetes. It can develop quickly and can be a medical emergency.  CAUSES   Missing or delaying meals.  Not eating enough carbohydrates at meals.  Taking too much diabetes medicine.  Not timing your oral diabetes medicine or insulin doses with meals, snacks, and exercise.  Nausea and vomiting.  Certain medicines.  Severe illnesses, such as hepatitis, kidney disorders, and certain eating disorders.  Increased activity or exercise without eating something extra or adjusting medicines.  Drinking too much alcohol.  A nerve disorder that affects body functions like your heart rate, blood pressure, and digestion (autonomic neuropathy).  A condition where the stomach muscles do not function properly (gastroparesis). Therefore, medicines and food may not absorb properly.  Rarely, a tumor of the pancreas can produce too much insulin. SYMPTOMS   Hunger.  Sweating (diaphoresis).  Change in body temperature.  Shakiness.  Headache.  Anxiety.  Lightheadedness.  Irritability.  Difficulty concentrating.  Dry mouth.  Tingling or numbness in the hands or feet.  Restless sleep or sleep disturbances.  Altered speech and coordination.  Change in mental status.  Seizures or prolonged convulsions.  Combativeness.  Drowsiness (lethargic).  Weakness.  Increased heart rate or palpitations.  Confusion.  Pale, gray skin color.  Blurred or  double vision.  Fainting. DIAGNOSIS  A physical exam and medical history will be performed. Your caregiver may make a diagnosis based on your symptoms. Blood tests and other lab tests may be performed to confirm a diagnosis. Once the diagnosis is made, your caregiver will see if your signs and symptoms go away once your blood glucose is raised.  TREATMENT  Usually, you can easily treat your hypoglycemia when you notice symptoms.  Check your blood glucose. If it is less than 70 mg/dl, take one of the following:   3-4 glucose tablets.    cup juice.    cup regular soda.   1 cup skim milk.   -1 tube of glucose gel.   5-6 hard candies.   Avoid high-fat drinks or food that may delay a rise in blood glucose levels.  Do not take more than the recommended amount of sugary foods, drinks, gel, or tablets. Doing so will cause your blood glucose to go too high.   Wait 10-15 minutes and recheck your blood glucose. If it is still less than 70 mg/dl or below your target range, repeat treatment.   Eat a snack if it is more than 1 hour until your next meal.  There may be a time when your blood glucose may go so low that you are unable to treat yourself at home when you start to notice symptoms. You may need someone to help you. You may even faint or be unable to swallow. If you cannot treat yourself, someone will need to bring you to the hospital.  [place]  If you have diabetes, follow your diabetes management plan by:  Taking your medicines as directed.  Following your exercise plan.  Following your meal plan. Do not skip meals. Eat on time.  Testing your blood glucose regularly. Check your blood glucose before and after exercise. If you exercise longer or different than usual, be sure to check blood glucose more frequently.  Wearing your medical alert jewelry that says you have diabetes.  Identify the cause of your hypoglycemia. Then, develop ways to prevent  the recurrence of hypoglycemia.  Do not take a hot bath or shower right after an insulin shot.  Always carry treatment with you. Glucose tablets are the easiest to carry.  If you are going to drink alcohol, drink it only with meals.  Tell friends or family members ways to keep you safe during a seizure. This may include removing hard or sharp objects from the area or turning you on your side.  Maintain a healthy weight. SEEK MEDICAL CARE IF:   You are having problems keeping your blood glucose in your target range.  You are having frequent episodes of hypoglycemia.  You feel you might be having side effects from your medicines.  You are not sure why your blood glucose is dropping so low.  You notice a change in vision or a new problem with your vision. SEEK IMMEDIATE MEDICAL CARE IF:   Confusion develops.  A change in mental status occurs.  The inability to swallow develops.  Fainting occurs. Document Released: 07/30/2005 Document Revised: 08/04/2013 Document Reviewed: 11/26/2011 [facility] Patient Information 2015 [place], [place]. This information is not intended to replace advice given to you by your health care provider. Make sure you discuss any questions you have with your health care provider.

## 2014-10-22 ENCOUNTER — Other Ambulatory Visit: Payer: Self-pay | Admitting: Family Medicine

## 2014-12-10 ENCOUNTER — Ambulatory Visit (INDEPENDENT_AMBULATORY_CARE_PROVIDER_SITE_OTHER): Payer: 59 | Admitting: Family Medicine

## 2014-12-10 ENCOUNTER — Telehealth: Payer: Self-pay | Admitting: Radiology

## 2014-12-10 VITALS — BP 130/80 | HR 71 | Temp 98.2°F | Resp 17 | Ht 70.5 in | Wt 235.0 lb

## 2014-12-10 DIAGNOSIS — IMO0002 Reserved for concepts with insufficient information to code with codable children: Secondary | ICD-10-CM

## 2014-12-10 DIAGNOSIS — I2510 Atherosclerotic heart disease of native coronary artery without angina pectoris: Secondary | ICD-10-CM

## 2014-12-10 DIAGNOSIS — E785 Hyperlipidemia, unspecified: Secondary | ICD-10-CM

## 2014-12-10 DIAGNOSIS — I10 Essential (primary) hypertension: Secondary | ICD-10-CM | POA: Diagnosis not present

## 2014-12-10 DIAGNOSIS — E119 Type 2 diabetes mellitus without complications: Secondary | ICD-10-CM

## 2014-12-10 DIAGNOSIS — E1165 Type 2 diabetes mellitus with hyperglycemia: Secondary | ICD-10-CM

## 2014-12-10 LAB — LIPID PANEL
Cholesterol: 131 mg/dL (ref 0–200)
HDL: 31 mg/dL — ABNORMAL LOW (ref >=40)
LDL Cholesterol: 67 mg/dL (ref 0–99)
Total CHOL/HDL Ratio: 4.2 Ratio
Triglycerides: 166 mg/dL — ABNORMAL HIGH (ref <150)
VLDL: 33 mg/dL (ref 0–40)

## 2014-12-10 LAB — GLUCOSE, POCT (MANUAL RESULT ENTRY): POC Glucose: 209 mg/dl — AB (ref 70–99)

## 2014-12-10 NOTE — Patient Instructions (Addendum)
You should receive a call or letter about your lab results within the next week to 10 days.  Schedule eye care appointment to look for eye changes associated with diabetes, and dentist.  Follow up in 1 month to recheck 3 month average.  Keep a record of your blood sugars outside of the office and bring them to the next office visit. We can discuss some of other recommendations with diabetes at next visit - see information below.  I will refer you to cardiologist for follow up.   Diabetes and Standards of Medical Care Diabetes is complicated. You may find that your diabetes team includes a dietitian, nurse, diabetes educator, eye doctor, and more. To help everyone know what is going on and to help you get the care you deserve, the following schedule of care was developed to help keep you on track. Below are the tests, exams, vaccines, medicines, education, and plans you will need. HbA1c test This test shows how well you have controlled your glucose over the past 2-3 months. It is used to see if your diabetes management plan needs to be adjusted.   It is performed at least 2 times a year if you are meeting treatment goals.  It is performed 4 times a year if therapy has changed or if you are not meeting treatment goals. Blood pressure test  This test is performed at every routine medical visit. The goal is less than 140/90 mm Hg for most people, but 130/80 mm Hg in some cases. Ask your health care provider about your goal. Dental exam  Follow up with the dentist regularly. Eye exam  If you are diagnosed with type 1 diabetes as a child, get an exam upon reaching the age of 54 years or older and have had diabetes for 3-5 years. Yearly eye exams are recommended after that initial eye exam.  If you are diagnosed with type 1 diabetes as an adult, get an exam within 5 years of diagnosis and then yearly.  If you are diagnosed with type 2 diabetes, get an exam as soon as possible after the diagnosis  and then yearly. Foot care exam  Visual foot exams are performed at every routine medical visit. The exams check for cuts, injuries, or other problems with the feet.  A comprehensive foot exam should be done yearly. This includes visual inspection as well as assessing foot pulses and testing for loss of sensation.  Check your feet nightly for cuts, injuries, or other problems with your feet. Tell your health care provider if anything is not healing. Kidney function test (urine microalbumin)  This test is performed once a year.  Type 1 diabetes: The first test is performed 5 years after diagnosis.  Type 2 diabetes: The first test is performed at the time of diagnosis.  A serum creatinine and estimated glomerular filtration rate (eGFR) test is done once a year to assess the level of chronic kidney disease (CKD), if present. Lipid profile (cholesterol, HDL, LDL, triglycerides)  Performed every 5 years for most people.  The goal for LDL is less than 100 mg/dL. If you are at high risk, the goal is less than 70 mg/dL.  The goal for HDL is 40 mg/dL-50 mg/dL for men and 50 mg/dL-60 mg/dL for women. An HDL cholesterol of 60 mg/dL or higher gives some protection against heart disease.  The goal for triglycerides is less than 150 mg/dL. Influenza vaccine, pneumococcal vaccine, and hepatitis B vaccine  The influenza vaccine is recommended  yearly.  It is recommended that people with diabetes who are over 16 years old get the pneumonia vaccine. In some cases, two separate shots may be given. Ask your health care provider if your pneumonia vaccination is up to date.  The hepatitis B vaccine is also recommended for adults with diabetes. Diabetes self-management education  Education is recommended at diagnosis and ongoing as needed. Treatment plan  Your treatment plan is reviewed at every medical visit. Document Released: 05/27/2009 Document Revised: 12/14/2013 Document Reviewed:  12/30/2012  [facility] Patient Information 2015 [place], [place]. This information is not intended to replace advice given to you by your health care provider. Make sure you discuss any questions you have with your health care provider.

## 2014-12-10 NOTE — Telephone Encounter (Signed)
Pt was under the impression that you were going to give him more glucotrol when he was here today. He is wondering why nothing was prescribed. Please advise.

## 2014-12-10 NOTE — Progress Notes (Signed)
Subjective:    Patient ID: Alexander Wolf, male    DOB: 11/17/1955, 59 y.o.   MRN: [id number]  HPI Alexander Wolf is a 59 y.o. male Here for follow up.   DM2 - see last note 09/24/14. elevated A1C on metformin.  Added glucotrol 45m qd. Ran out of this for past 4 days.  Continued metformin 10065mBID - denies missed doses of this.  Diet adherence discussed prior. Does not check blood sugars at home. Does have a meter, just not checking blood sugars. Has been cutting back on sugar containing foods - avoiding ice cream. No recent optho eval, last dentist a few years ago as well.  Construction work as exercise.   Lab Results  Component Value Date   HGBA1C 10.4 09/24/2014   Hyperlipidemia -on crestor 4081md. Overall controlled LDL in 03/2014. No new muscle aches or new side effects.  Fasting today.  Lab Results  Component Value Date   CHOL 159 04/04/2014   HDL 39* 04/04/2014   LDLCALC 81 04/04/2014   TRIG 193* 04/04/2014   CHOLHDL 4.1 04/04/2014   CAD  - s/p CABG. On ASA QD. Has not seen cardiologist in some time. Does not remember having a stress test since the bypass about 8 years ago. Agrees to [facility]   HTN -  Most recent creatinine in February normal at 0.69. Takes lisinopril 82m9m, metoprolol 50mg77m BID. Outside BP 118/80. No new side effects with these meds.   Patient Active Problem List   Diagnosis Date Noted  . Coronary artery disease involving coronary bypass graft of native heart without angina pectoris 04/04/2014  . Essential hypertension 04/04/2014  . Other and unspecified hyperlipidemia 04/04/2014  . Type II or unspecified type diabetes mellitus with unspecified complication, uncontrolled 04/04/2014  . Hx of bladder cancer 04/04/2014   Past Medical History  Diagnosis Date  . Cancer   . Diabetes mellitus without complication   . GERD (gastroesophageal reflux disease)   . Hyperlipidemia   . Hypertension   .  Insomnia    Past Surgical History  Procedure Laterality Date  . Coronary artery bypass graft    . Bladder surgery     No Known Allergies Prior to Admission medications   Medication Sig Start Date End Date Taking? Authorizing Provider  aspirin 325 MG tablet Take 325 mg by mouth daily.   Yes Historical Provider, MD  glipiZIDE (GLUCOTROL) 5 MG tablet Take 1 tablet (5 mg total) by mouth daily with breakfast. 09/24/14  Yes MichaTereasa Coop[facility]  lisinopril (PRINIVIL,ZESTRIL) 20 MG tablet TAKE 1 TABLET BY MOUTH DAILY 10/22/14  Yes MichaTereasa [facility]op[facility]  metFORMIN (GLUCOPHAGE) 1000 MG tablet Take 1 tablet (1,000 mg total) by mouth 2 (two) times daily with a meal. 04/04/14  Yes Thao P Le, DO  metoprolol (LOPRESSOR) 50 MG tablet Take 1/2 tab PO BID 04/04/14  Yes Thao P Le, DO  Omega-3 Fatty Acids (FISH OIL) 300 MG CAPS Take 600 capsules by mouth.   Yes Historical Provider, MD  omeprazole (PRILOSEC) 40 MG capsule Take 1 capsule (40 mg total) by mouth daily. 04/04/14  Yes Thao P Le, DO  rosuvastatin (CRESTOR) 40 MG tablet Take 1 tablet (40 mg total) by mouth daily. 09/24/14  Yes MichaTereasa [facility]opC   History   Social History  . Marital Status: Single    Spouse Name: N/A  . Number of Children:  N/A  . Years of Education: N/A   Occupational History  . Not on file.   Social History Main Topics  . Smoking status: Never Smoker   . Smokeless tobacco: Not on file  . Alcohol Use: No  . Drug Use: No  . Sexual Activity: No   Other Topics Concern  . Not on file   Social History Narrative       Review of Systems  Constitutional: Negative for fatigue and unexpected weight change.  Eyes: Negative for visual disturbance.  Respiratory: Negative for cough, chest tightness and shortness of breath.   Cardiovascular: Negative for chest pain, palpitations and leg swelling.  Gastrointestinal: Negative for abdominal pain and blood in stool.  Neurological: Negative for dizziness, light-headedness and  headaches.       Objective:   Physical Exam  Constitutional: He is oriented to person, place, and time. He appears well-developed and well-nourished.  HENT:  Head: Normocephalic and atraumatic.  Eyes: Pupils are equal, round, and reactive to light.  Cardiovascular: Normal rate, regular rhythm, normal heart sounds and intact distal pulses.   Pulmonary/Chest: Effort normal and breath sounds normal.  Abdominal: Soft. There is no tenderness.  Neurological: He is alert and oriented to person, place, and time.  Microfilament testing of feet normal bilaterally.  Skin: Skin is warm, dry and intact. No rash noted.  Psychiatric: He has a normal mood and affect. His behavior is normal.  Vitals reviewed.  Filed Vitals:   12/10/14 0812  BP: 130/80  Pulse: 71  Temp: 98.2 F (36.8 C)  TempSrc: Oral  Resp: 17  Height: 5' 10.5" (1.791 m)  Weight: 235 lb (106.595 kg)  SpO2: 96%     Results for orders placed or performed in visit on 12/10/14  POCT glucose (manual entry)  Result Value Ref Range   POC Glucose 209 (A) 70 - 99 mg/dl        Assessment & Plan:  Alexander Wolf is a 59 y.o. male Diabetes type 2, uncontrolled - Plan: POCT glucose (manual entry)  - continue same regimen for now as improved diet.   -Elevated reading in office due to missed doses of glipizide past few days as ran out.  -Check home readings and return in 1 month for recheck A1c.    -handout given on typical recommended tests/eval for diabetes.   Essential hypertension  -stable. No med changes- refilled.  Hyperlipidemia - Plan: Lipid panel  -cont Crestor 45m qd.   Coronary artery disease involving native coronary artery of native heart without angina pectoris - Plan: Ambulatory referral to Cardiology  -overdue for follow up with cardiology and possible repeat stress testing.   -referred to cardiologist.   No orders of the defined types were placed in this encounter.   Patient Instructions  You should  receive a call or letter about your lab results within the next week to 10 days.  Schedule eye care appointment to look for eye changes associated with diabetes, and dentist.  Follow up in 1 month to recheck 3 month average.  Keep a record of your blood sugars outside of the office and bring them to the next office visit. We can discuss some of other recommendations with diabetes at next visit - see information below.  I will refer you to cardiologist for follow up.   Diabetes and Standards of Medical Care Diabetes is complicated. You may find that your diabetes team includes a dietitian, nurse, diabetes educator, eye doctor, and more. To  help everyone know what is going on and to help you get the care you deserve, the following schedule of care was developed to help keep you on track. Below are the tests, exams, vaccines, medicines, education, and plans you will need. HbA1c test This test shows how well you have controlled your glucose over the past 2-3 months. It is used to see if your diabetes management plan needs to be adjusted.   It is performed at least 2 times a year if you are meeting treatment goals.  It is performed 4 times a year if therapy has changed or if you are not meeting treatment goals. Blood pressure test  This test is performed at every routine medical visit. The goal is less than 140/90 mm Hg for most people, but 130/80 mm Hg in some cases. Ask your health care provider about your goal. Dental exam  Follow up with the dentist regularly. Eye exam  If you are diagnosed with type 1 diabetes as a child, get an exam upon reaching the age of 19 years or older and have had diabetes for 3-5 years. Yearly eye exams are recommended after that initial eye exam.  If you are diagnosed with type 1 diabetes as an adult, get an exam within 5 years of diagnosis and then yearly.  If you are diagnosed with type 2 diabetes, get an exam as soon as possible after the diagnosis and then  yearly. Foot care exam  Visual foot exams are performed at every routine medical visit. The exams check for cuts, injuries, or other problems with the feet.  A comprehensive foot exam should be done yearly. This includes visual inspection as well as assessing foot pulses and testing for loss of sensation.  Check your feet nightly for cuts, injuries, or other problems with your feet. Tell your health care provider if anything is not healing. Kidney function test (urine microalbumin)  This test is performed once a year.  Type 1 diabetes: The first test is performed 5 years after diagnosis.  Type 2 diabetes: The first test is performed at the time of diagnosis.  A serum creatinine and estimated glomerular filtration rate (eGFR) test is done once a year to assess the level of chronic kidney disease (CKD), if present. Lipid profile (cholesterol, HDL, LDL, triglycerides)  Performed every 5 years for most people.  The goal for LDL is less than 100 mg/dL. If you are at high risk, the goal is less than 70 mg/dL.  The goal for HDL is 40 mg/dL-50 mg/dL for men and 50 mg/dL-60 mg/dL for women. An HDL cholesterol of 60 mg/dL or higher gives some protection against heart disease.  The goal for triglycerides is less than 150 mg/dL. Influenza vaccine, pneumococcal vaccine, and hepatitis B vaccine  The influenza vaccine is recommended yearly.  It is recommended that people with diabetes who are over 56 years old get the pneumonia vaccine. In some cases, two separate shots may be given. Ask your health care provider if your pneumonia vaccination is up to date.  The hepatitis B vaccine is also recommended for adults with diabetes. Diabetes self-management education  Education is recommended at diagnosis and ongoing as needed. Treatment plan  Your treatment plan is reviewed at every medical visit. Document Released: 05/27/2009 Document Revised: 12/14/2013 Document Reviewed: 12/30/2012 [facility]  Patient Information 2015 [place], [place]. This information is not intended to replace advice given to you by your health care provider. Make sure you discuss any questions you have with  your health care provider.       I personally performed the services described in this documentation, which was scribed in my presence. The recorded information has been reviewed and considered, and addended by me as needed.

## 2014-12-11 MED ORDER — GLIPIZIDE 5 MG PO TABS: 5.0000 mg | ORAL_TABLET | Freq: Every day | ORAL | Status: DC

## 2014-12-11 MED ORDER — METOPROLOL TARTRATE 50 MG PO TABS: ORAL_TABLET | ORAL | Status: DC

## 2014-12-11 MED ORDER — METFORMIN HCL 1000 MG PO TABS: 1000.0000 mg | ORAL_TABLET | Freq: Two times a day (BID) | ORAL | Status: DC

## 2014-12-11 MED ORDER — LISINOPRIL 20 MG PO TABS: 20.0000 mg | ORAL_TABLET | Freq: Every day | ORAL | Status: DC

## 2014-12-11 MED ORDER — ROSUVASTATIN CALCIUM 40 MG PO TABS: 40.0000 mg | ORAL_TABLET | Freq: Every day | ORAL | Status: DC

## 2014-12-11 NOTE — Telephone Encounter (Signed)
i was, but had not sent it in when he left.  I wanted to also look and see what else may need refilled. Meds have now been sent - I changed them to 90 day supply to lessen copays and trips to pharmacy.  Keep follow up in approx 1 month. PLease apologize for delay.

## 2014-12-12 NOTE — Telephone Encounter (Signed)
Thank you I have called patient to advise. Left message

## 2014-12-20 ENCOUNTER — Telehealth: Payer: Self-pay | Admitting: Family Medicine

## 2014-12-20 NOTE — Telephone Encounter (Signed)
PLease call patient and see if we can help with this. At our last visit we discussed concerns of history of CAD and hx of CABG, without recent cardiology eval and he agreed to see cardiology if I referred him.  It appears they are trying to call him to schedule this. Thanks.

## 2014-12-20 NOTE — Telephone Encounter (Signed)
New Message   Patient has been called on 3 different occassions to schedule a new patient/consult with one of our cardiologist here and patient has yet to call and schedule. We are going to take the patients name out of the work queue and if an appt is needed in the future another referral has to be put in.

## 2014-12-22 NOTE — Telephone Encounter (Signed)
lmom to give us a call back  

## 2014-12-23 NOTE — Telephone Encounter (Signed)
Spoke with patient he states that to be honest he really don't want to have a cardiology that he's been several times and its just a waste of money i advised him that ill let the Dr Carlota Raspberry Know this

## 2015-01-07 ENCOUNTER — Encounter: Payer: Self-pay | Admitting: Family Medicine

## 2015-01-07 ENCOUNTER — Ambulatory Visit (INDEPENDENT_AMBULATORY_CARE_PROVIDER_SITE_OTHER): Payer: 59 | Admitting: Family Medicine

## 2015-01-07 VITALS — BP 126/68 | HR 69 | Temp 98.3°F | Resp 16 | Ht 69.5 in | Wt 231.6 lb

## 2015-01-07 DIAGNOSIS — Z79899 Other long term (current) drug therapy: Secondary | ICD-10-CM

## 2015-01-07 DIAGNOSIS — E119 Type 2 diabetes mellitus without complications: Secondary | ICD-10-CM | POA: Diagnosis not present

## 2015-01-07 DIAGNOSIS — E785 Hyperlipidemia, unspecified: Secondary | ICD-10-CM | POA: Diagnosis not present

## 2015-01-07 DIAGNOSIS — I25810 Atherosclerosis of coronary artery bypass graft(s) without angina pectoris: Secondary | ICD-10-CM

## 2015-01-07 DIAGNOSIS — I10 Essential (primary) hypertension: Secondary | ICD-10-CM

## 2015-01-07 LAB — POCT GLYCOSYLATED HEMOGLOBIN (HGB A1C): Hemoglobin A1C: 8.1

## 2015-01-07 NOTE — Progress Notes (Addendum)
Subjective:  This chart was scribed for [name], MD by Jeanell Sparrow, ED Scribe. This patient was seen in room 25 and the patient's care was started at 4:40 PM.   Patient ID: Alexander Wolf, male    DOB: 08/01/1956, 59 y.o.   MRN: [id number]  Chief Complaint  Patient presents with  . Follow-up    diabetes    HPI HPI Comments: Alexander Wolf is a 59 y.o. male who presents to the [facility] complaining of a need for a follow up appointment.   Last seen 3 months ago. Hemoglobin A1C was 10.4. Pt has not been compliant with hx of standards of care or diabetic diet, though he does take his medication as prescribed. 3 months ago he was on metformin max dose. Was started on Glipizide 5. Ran out of Glipizide, and so restarted and told to recheck today.Urine albumin was elevated at 35. He returned 2 months later to follow up. His CBG was increased at 200 but still improved.  He reports that he has not had any episodes of low blood sugar, and one reports of mild blood sugar spike.   Pt was continued on lisinopril 20 and metoprolol his blood pressure was at goal.    Hx of chronic coronary artery disease with a CABG 8 years previously. Prior to his MI in 2007, he reports that he had sudden onset of bilateral chest pain during intercourse, and burning sensation after eating. He states that the pain persisted, so he got it evaluated and had to have a bypass done in 2007.  After he followed up with cardiology every 6 mos but felt that nothing was done other than listening to his heart and refilling his meds so he has not seen cards in several years now, he denies any stress testing since his cabg. Taking aspirin daily. Agreed to cardiology evaluation under [facility] at his last visit but then when [facility] called him he refused to sched due to expense.  Lipid panel one month previously showed LDL and total cholesterol was at goal on current crestor.   He states that he feels that his meals are  adequately portioned, but he does not have an exercise regiment.   He reports that he recently quit smoking.   Depression screen PHQ 2/9 01/07/2015  Decreased Interest 0  Down, Depressed, Hopeless 0  PHQ - 2 Score 0     Past Medical History  Diagnosis Date  . Cancer   . Diabetes mellitus without complication   . GERD (gastroesophageal reflux disease)   . Hyperlipidemia   . Hypertension   . Insomnia    No Known Allergies Current Outpatient Prescriptions on File Prior to Visit  Medication Sig Dispense Refill  . aspirin 325 MG tablet Take 325 mg by mouth daily.    Marland Kitchen glipiZIDE (GLUCOTROL) 5 MG tablet Take 1 tablet (5 mg total) by mouth daily with breakfast. 90 tablet 0  . lisinopril (PRINIVIL,ZESTRIL) 20 MG tablet Take 1 tablet (20 mg total) by mouth daily. 90 tablet 1  . metFORMIN (GLUCOPHAGE) 1000 MG tablet Take 1 tablet (1,000 mg total) by mouth 2 (two) times daily with a meal. 180 tablet 0  . metoprolol (LOPRESSOR) 50 MG tablet Take 1/2 tab PO BID 90 tablet 1  . Omega-3 Fatty Acids (FISH OIL) 300 MG CAPS Take 600 capsules by mouth.    Marland Kitchen omeprazole (PRILOSEC) 40 MG capsule Take 1 capsule (40 mg total) by mouth daily.  30 capsule 11  . rosuvastatin (CRESTOR) 40 MG tablet Take 1 tablet (40 mg total) by mouth daily. 90 tablet 1   No current facility-administered medications on file prior to visit.   Review of Systems  Constitutional: Negative for fever, diaphoresis, activity change, appetite change and unexpected weight change.  Respiratory: Negative for chest tightness, shortness of breath and wheezing.   Cardiovascular: Negative for chest pain, palpitations and leg swelling.  Gastrointestinal: Negative for nausea and vomiting.  Endocrine: Negative for polydipsia, polyphagia and polyuria.  Genitourinary: Negative for dysuria, urgency, frequency and decreased urine volume.  Skin: Negative for color change and rash.  Neurological: Negative for dizziness, syncope, facial asymmetry,  light-headedness and numbness.  Psychiatric/Behavioral: Positive for sleep disturbance.      Objective:   Physical Exam  Constitutional: He is oriented to person, place, and time. He appears well-developed and well-nourished. No distress.  HENT:  Head: Normocephalic and atraumatic.  Neck: Neck supple. No tracheal deviation present. No thyromegaly present.  Cardiovascular: Normal rate, regular rhythm, S1 normal and S2 normal.  Exam reveals no gallop and no friction rub.   No murmur heard. Pulmonary/Chest: Effort normal. No respiratory distress. He has no wheezes. He has no rales.  Musculoskeletal: Normal range of motion.  Neurological: He is alert and oriented to person, place, and time.  Skin: Skin is warm and dry.  Psychiatric: He has a normal mood and affect. His behavior is normal.  Nursing note and vitals reviewed.   Results for orders placed or performed in visit on 01/07/15  POCT glycosylated hemoglobin (Hb A1C)  Result Value Ref Range   Hemoglobin A1C 8.1    Diabetic Foot Exam - Simple   Simple Foot Form   01/07/2015  4:13 PM  Visual Inspection  Sensation Testing  Pulse Check  Comments       BP 126/68 mmHg  Pulse 69  Temp(Src) 98.3 F (36.8 C) (Oral)  Resp 16  Ht 5' 9.5" (1.765 m)  Wt 231 lb 9.6 oz (105.053 kg)  BMI 33.72 kg/m2  SpO2 96%    Assessment & Plan:   1. Type 2 diabetes mellitus not at goal - much improved down to 8.1 from 10.4.  Cont metformin 1g bid. Was out of glipizide 5 qam for sev d a week ago so a1c will hopefully cont to decrease as he increases his lifestyle changes and med compliance.  May need to increase glipizide to 10 XR or add in 2.5 mg with dinner. Alternatively, discussed w/ pt that we could try him on one of the newer meds - reviewed r/b of DPP-4 inh, GLP-1 ag, vs SGLT3 inh as may have less weight gain or even weight loss - pt will cont glipizide for now but can call or discuss at next OV if he would like.  He has made large changes in  diet/exercise so encouraged. Recheck in 3 mos  2. Essential hypertension - very well controlled on current regimen   3. Coronary artery disease involving coronary bypass graft of native heart without angina pectoris - recommend stress testing for secondary prevention - discussed in detail and pt agrees - he will call [facility] and sched to see cardiology - even if he is only willing to f/u w/ cards every other yr could still make a difference in his morbidity/mortality. Cont asa.  4. Hyperlipidemia LDL goal <70 - lipids at goal 1 mo prev so cont crestor 40  5. Polypharmacy     Orders Placed This  Encounter  Procedures  . POCT glycosylated hemoglobin (Hb A1C)     I personally performed the services described in this documentation, which was scribed in my presence. The recorded information has been reviewed and considered, and addended by me as needed.  [name], MD MPH

## 2015-03-08 ENCOUNTER — Other Ambulatory Visit: Payer: Self-pay | Admitting: Physician Assistant

## 2015-03-11 ENCOUNTER — Other Ambulatory Visit: Payer: Self-pay | Admitting: Family Medicine

## 2015-03-11 ENCOUNTER — Telehealth: Payer: Self-pay

## 2015-03-11 MED ORDER — ZOLPIDEM TARTRATE 10 MG PO TABS: 10.0000 mg | ORAL_TABLET | Freq: Every evening | ORAL | Status: DC | PRN

## 2015-03-11 NOTE — Telephone Encounter (Signed)
Pt would like a refill on his zolpidem (AMBIEN) 5 MG tablet [[id number]] script. Please advise at [phone]

## 2015-03-11 NOTE — Telephone Encounter (Signed)
Please call him to let him know I called in a 30 day supply of ambien 10 mg , that was what he was rx by Janace Litten last time, if he does not need the full amount I would recommend taking 5 mg or 1/2 tab at night as needed, he needs to talk about refills with Dr Brigitte Pulse at next appt

## 2015-03-15 NOTE — Telephone Encounter (Signed)
Left message for pt to call back  °

## 2015-03-18 ENCOUNTER — Other Ambulatory Visit: Payer: Self-pay | Admitting: Family Medicine

## 2015-04-08 ENCOUNTER — Other Ambulatory Visit: Payer: Self-pay | Admitting: Family Medicine

## 2015-04-08 NOTE — Telephone Encounter (Signed)
Dr Marin Comment, do you want this to go to [facility] instead? Please advise, and also when pt needs to f/up.

## 2015-04-09 ENCOUNTER — Telehealth: Payer: Self-pay

## 2015-04-09 NOTE — Telephone Encounter (Signed)
Pt is checking on status of his refill request  Best number [phone]

## 2015-04-10 ENCOUNTER — Telehealth: Payer: Self-pay | Admitting: Radiology

## 2015-04-10 ENCOUNTER — Other Ambulatory Visit: Payer: Self-pay | Admitting: Physician Assistant

## 2015-04-10 DIAGNOSIS — I10 Essential (primary) hypertension: Secondary | ICD-10-CM

## 2015-04-10 MED ORDER — METOPROLOL TARTRATE 50 MG PO TABS: ORAL_TABLET | ORAL | Status: DC

## 2015-04-10 MED ORDER — ZOLPIDEM TARTRATE 10 MG PO TABS: 10.0000 mg | ORAL_TABLET | Freq: Every evening | ORAL | Status: DC | PRN

## 2015-04-10 NOTE — Telephone Encounter (Signed)
Patient has called/ has ran out of Metoprolol I have sent in for him called him to advise , he is also requesting Ambien refill, have sent this for review by a provider, this is controlled substance. He has appt with Dr Tamala Julian in October, our office RS this appt I think Dr Brigitte Pulse previously gave this for him.

## 2015-04-10 NOTE — Telephone Encounter (Signed)
Refilled metoprolol and ambien to get pt to his f/u appt on 10/14 w/ Dr. Tamala Julian - please inform pt that both have been called into his pharmacy.

## 2015-04-11 NOTE — Telephone Encounter (Signed)
Advised pt Rx's sent in.

## 2015-04-22 ENCOUNTER — Other Ambulatory Visit: Payer: Self-pay | Admitting: Family Medicine

## 2015-05-04 ENCOUNTER — Other Ambulatory Visit: Payer: Self-pay | Admitting: Family Medicine

## 2015-05-05 NOTE — Telephone Encounter (Signed)
Pharm reqs 90 day RF. Pt was due for f/up end of Aug, but does have appt sch 10/14. Can we give 90 days?

## 2015-05-12 ENCOUNTER — Ambulatory Visit: Payer: 59 | Admitting: Family Medicine

## 2015-05-13 ENCOUNTER — Ambulatory Visit: Payer: 59 | Admitting: Family Medicine

## 2015-05-23 ENCOUNTER — Other Ambulatory Visit: Payer: Self-pay | Admitting: Family Medicine

## 2015-05-25 ENCOUNTER — Other Ambulatory Visit: Payer: Self-pay | Admitting: Family Medicine

## 2015-05-26 NOTE — Telephone Encounter (Signed)
Pt called in about medication glipiZIDE (GLUCOTROL) 5 MG tablet [[id number]]  He states that pharmacy has been trying to get this medication filled since 05/23/15 with no luck. Pt is a little upset that he's had problems getting this medicine refilled since Dr.[name] hasn't been able to see him anymore.

## 2015-05-27 ENCOUNTER — Ambulatory Visit (INDEPENDENT_AMBULATORY_CARE_PROVIDER_SITE_OTHER): Payer: 59 | Admitting: Family Medicine

## 2015-05-27 ENCOUNTER — Encounter: Payer: Self-pay | Admitting: Family Medicine

## 2015-05-27 VITALS — BP 118/73 | HR 55 | Temp 98.3°F | Resp 16 | Ht 70.0 in | Wt 238.2 lb

## 2015-05-27 DIAGNOSIS — Z114 Encounter for screening for human immunodeficiency virus [HIV]: Secondary | ICD-10-CM

## 2015-05-27 DIAGNOSIS — Z1159 Encounter for screening for other viral diseases: Secondary | ICD-10-CM

## 2015-05-27 DIAGNOSIS — E785 Hyperlipidemia, unspecified: Secondary | ICD-10-CM

## 2015-05-27 DIAGNOSIS — I25810 Atherosclerosis of coronary artery bypass graft(s) without angina pectoris: Secondary | ICD-10-CM | POA: Diagnosis not present

## 2015-05-27 DIAGNOSIS — E119 Type 2 diabetes mellitus without complications: Secondary | ICD-10-CM | POA: Diagnosis not present

## 2015-05-27 DIAGNOSIS — Z8551 Personal history of malignant neoplasm of bladder: Secondary | ICD-10-CM | POA: Diagnosis not present

## 2015-05-27 DIAGNOSIS — Z23 Encounter for immunization: Secondary | ICD-10-CM

## 2015-05-27 DIAGNOSIS — K219 Gastro-esophageal reflux disease without esophagitis: Secondary | ICD-10-CM | POA: Diagnosis not present

## 2015-05-27 DIAGNOSIS — I10 Essential (primary) hypertension: Secondary | ICD-10-CM

## 2015-05-27 LAB — COMPREHENSIVE METABOLIC PANEL
ALK PHOS: 82 U/L (ref 40–115)
ALT: 13 U/L (ref 9–46)
AST: 17 U/L (ref 10–35)
Albumin: 4.4 g/dL (ref 3.6–5.1)
BILIRUBIN TOTAL: 0.6 mg/dL (ref 0.2–1.2)
BUN: 17 mg/dL (ref 7–25)
CO2: 25 mmol/L (ref 20–31)
Calcium: 9.3 mg/dL (ref 8.6–10.3)
Chloride: 104 mmol/L (ref 98–110)
Creat: 0.75 mg/dL (ref 0.70–1.33)
GLUCOSE: 102 mg/dL — AB (ref 65–99)
Potassium: 4.5 mmol/L (ref 3.5–5.3)
Sodium: 138 mmol/L (ref 135–146)
TOTAL PROTEIN: 6.9 g/dL (ref 6.1–8.1)

## 2015-05-27 LAB — CBC WITH DIFFERENTIAL/PLATELET
BASOS ABS: 0.1 10*3/uL (ref 0.0–0.1)
Basophils Relative: 1 % (ref 0–1)
EOS ABS: 0.1 10*3/uL (ref 0.0–0.7)
EOS PCT: 1 % (ref 0–5)
HEMATOCRIT: 44.5 % (ref 39.0–52.0)
Hemoglobin: 15.2 g/dL (ref 13.0–17.0)
LYMPHS ABS: 3.7 10*3/uL (ref 0.7–4.0)
LYMPHS PCT: 41 % (ref 12–46)
MCH: 29.3 pg (ref 26.0–34.0)
MCHC: 34.2 g/dL (ref 30.0–36.0)
MCV: 85.9 fL (ref 78.0–100.0)
MPV: 10.6 fL (ref 8.6–12.4)
Monocytes Absolute: 0.8 10*3/uL (ref 0.1–1.0)
Monocytes Relative: 9 % (ref 3–12)
NEUTROS PCT: 48 % (ref 43–77)
Neutro Abs: 4.3 10*3/uL (ref 1.7–7.7)
PLATELETS: 250 10*3/uL (ref 150–400)
RBC: 5.18 MIL/uL (ref 4.22–5.81)
RDW: 14 % (ref 11.5–15.5)
WBC: 9 10*3/uL (ref 4.0–10.5)

## 2015-05-27 LAB — LIPID PANEL
CHOLESTEROL: 123 mg/dL — AB (ref 125–200)
HDL: 31 mg/dL — ABNORMAL LOW (ref >=40)
LDL Cholesterol: 58 mg/dL (ref <130)
Total CHOL/HDL Ratio: 4 Ratio (ref <=5.0)
Triglycerides: 170 mg/dL — ABNORMAL HIGH (ref <150)
VLDL: 34 mg/dL — ABNORMAL HIGH (ref <30)

## 2015-05-27 LAB — HEMOGLOBIN A1C: Hgb A1c MFr Bld: 8.8 % — AB (ref 4.0–6.0)

## 2015-05-27 LAB — POCT GLYCOSYLATED HEMOGLOBIN (HGB A1C): Hemoglobin A1C: 8.8

## 2015-05-27 MED ORDER — GLIPIZIDE 5 MG PO TABS: 5.0000 mg | ORAL_TABLET | Freq: Two times a day (BID) | ORAL | Status: DC

## 2015-05-27 MED ORDER — OMEPRAZOLE 40 MG PO CPDR: DELAYED_RELEASE_CAPSULE | ORAL | Status: DC

## 2015-05-27 MED ORDER — LISINOPRIL 20 MG PO TABS: 20.0000 mg | ORAL_TABLET | Freq: Every day | ORAL | Status: DC

## 2015-05-27 MED ORDER — ROSUVASTATIN CALCIUM 40 MG PO TABS: 40.0000 mg | ORAL_TABLET | Freq: Every day | ORAL | Status: DC

## 2015-05-27 MED ORDER — METFORMIN HCL 1000 MG PO TABS: 1000.0000 mg | ORAL_TABLET | Freq: Two times a day (BID) | ORAL | Status: DC

## 2015-05-27 MED ORDER — METOPROLOL TARTRATE 50 MG PO TABS: ORAL_TABLET | ORAL | Status: DC

## 2015-05-27 NOTE — Progress Notes (Signed)
Subjective:    Patient ID: Alexander Wolf, male    DOB: [birth year], 59 y.o.   MRN: [id number]  05/27/2015  Establish Care and Medication Refill   HPI This 59 y.o. male presents for five month follow-up of the following.  1. DMII: requested rx last week for Glipizide.   Metformin bid 1000mg .  Fasting sugars 90s.    2.  Hyperlipidemia: Crestor 40mg  daily. Patient reports good compliance with medication, good tolerance to medication, and good symptom control.    3.  HTN: Lisinopril daily. Metoprolol 50mg  1/2 bid. Patient reports good compliance with medication, good tolerance to medication, and good symptom control.    4.  Bladder cancer hx: Dr. Drue Stager in [facility]; last appointment years ago; no recent recent urology appointment.  5. CAD:  ASA daily; cardiology none in ages; [facility].  No benefit to seeing cardiology.  6. GERD: Prilosec 40mg  daily PRN.  6. Health maintenance:  Last physical:  Never; not recently Colonoscopy:  2011; +polyps; repeat 5 years.   TDAP:  never Pneumovax: 2016 Zostavax: never Influenza: agreeable. Eye exam:  Several years Dental exam:  Several years   Review of Systems  Constitutional: Negative for fever, chills, diaphoresis, activity change, appetite change and fatigue.  Eyes: Negative for visual disturbance.  Respiratory: Negative for cough and shortness of breath.   Cardiovascular: Negative for chest pain, palpitations and leg swelling.  Endocrine: Negative for cold intolerance, heat intolerance, polydipsia, polyphagia and polyuria.  Neurological: Negative for dizziness, tremors, seizures, syncope, facial asymmetry, speech difficulty, weakness, light-headedness, numbness and headaches.    Past Medical History  Diagnosis Date  . Diabetes mellitus without complication ([place])   . GERD (gastroesophageal reflux disease)   . Hyperlipidemia   . Hypertension   . Insomnia   . Coronary artery disease     s/p CABG  . Cancer [facility])     Bladder  cancer; [facility] in [place], [place];    Past Surgical History  Procedure Laterality Date  . Coronary artery bypass graft    . Bladder surgery    . Tonsillectomy     No Known Allergies  Social History   Social History  . Marital Status: Single    Spouse Name: N/A  . Number of Children: N/A  . Years of Education: N/A   Occupational History  . Not on file.   Social History Main Topics  . Smoking status: Never Smoker   . Smokeless tobacco: Not on file  . Alcohol Use: No  . Drug Use: No  . Sexual Activity: No   Other Topics Concern  . Not on file   Social History Narrative   Marital status: married x 12 years; second marriage.      Children: 3 children; 3 grandchildren; no gg      Lives: with wife      Employment:  Building control surveyor; Architect; 40 hours per week      Tobacco: quit in 3 years ago; smoked 45 years      Alcohol:  None      Drugs:  None      Exercise: works hard every day   Family History  Problem Relation Age of Onset  . Varicose Veins Mother        Objective:    BP 118/73 mmHg  Pulse 55  Temp(Src) 98.3 F (36.8 C) (Oral)  Resp 16  Ht 5\' 10"  (1.778 m)  Wt 238 lb 3.2 oz (108.047 kg)  BMI 34.18 kg/m2  Physical Exam  Constitutional: He is oriented to person, place, and time. He appears well-developed and well-nourished. No distress.  HENT:  Head: Normocephalic and atraumatic.  Right Ear: External ear normal.  Left Ear: External ear normal.  Nose: Nose normal.  Mouth/Throat: Oropharynx is clear and moist.  Eyes: Conjunctivae and EOM are normal. Pupils are equal, round, and reactive to light.  Neck: Normal range of motion. Neck supple. Carotid bruit is not present. No thyromegaly present.  Cardiovascular: Normal rate, regular rhythm, normal heart sounds and intact distal pulses.  Exam reveals no gallop and no friction rub.   No murmur heard. Pulmonary/Chest: Effort normal and breath sounds normal. He has no wheezes. He has no rales.  Abdominal: Soft. Bowel  sounds are normal. He exhibits no distension and no mass. There is no tenderness. There is no rebound and no guarding.  Lymphadenopathy:    He has no cervical adenopathy.  Neurological: He is alert and oriented to person, place, and time. No cranial nerve deficit.  Skin: Skin is warm and dry. No rash noted. He is not diaphoretic.  Psychiatric: He has a normal mood and affect. His behavior is normal.  Nursing note and vitals reviewed.       Assessment & Plan:   1. Type 2 diabetes mellitus not at goal [facility])   2. Hyperlipidemia LDL goal <70   3. Hx of bladder cancer   4. Essential hypertension   5. Coronary artery disease involving coronary bypass graft of native heart without angina pectoris   6. Screening for HIV (human immunodeficiency virus)   7. Need for hepatitis C screening test   8. Need for prophylactic vaccination and inoculation against influenza   9. Hyperlipidemia   10. Need for Tdap vaccination     1. DMII: uncontrolled; discussed comorbidities associated with chronic uncontrolled diabetes; pt expressed understanding and is very reluctant to add medications.  Encouraged weight loss, exercise, low-carbohydrate food choices.  Rx for Metformin 1000mg  bid and Glucotrol 5mg  bid. If remains > 7.0 at next visit, will need to add Invokana or Januvia. 2.  HTN: controlled; obtain labs; continue medications. 3.  Hyperlipidemia: stable; obtain labs; refill provided.  LDL goal < 70 due to CAD. 4.  CAD: no recent cardiology evaluation; highly encourage annual evaluation by cardiology; pt expressed understanding.   5.  History of bladder cancer: highly encourage regular follow-up with urology; referral placed. 6.  S/p TDAP and influenza vaccines. 7.  Screening Hepatitis C and HIV: obtain per CDC guidelines.   Orders Placed This Encounter  Procedures  . Flu Vaccine QUAD 36+ mos IM  . Tdap vaccine greater than or equal to 7yo IM  . CBC with Differential/Platelet  . Comprehensive  metabolic panel    Order Specific Question:  Has the patient fasted?    Answer:  Yes  . Lipid panel    Order Specific Question:  Has the patient fasted?    Answer:  Yes  . HIV antibody  . Hepatitis C antibody  . Hepatitis C RNA quantitative  . Ambulatory referral to Urology    Referral Priority:  Routine    Referral Type:  Consultation    Referral Reason:  Specialty Services Required    Requested Specialty:  Urology    Number of Visits Requested:  1  . POCT glycosylated hemoglobin (Hb A1C)   Meds ordered this encounter  Medications  . lisinopril (PRINIVIL,ZESTRIL) 20 MG tablet    Sig: Take 1 tablet (20 mg total)  by mouth daily.    Dispense:  90 tablet    Refill:  1  . metFORMIN (GLUCOPHAGE) 1000 MG tablet    Sig: Take 1 tablet (1,000 mg total) by mouth 2 (two) times daily with a meal.    Dispense:  180 tablet    Refill:  1    **Patient requests 90 days supply**  . metoprolol (LOPRESSOR) 50 MG tablet    Sig: Take 1/2 tab PO BID    Dispense:  90 tablet    Refill:  1  . omeprazole (PRILOSEC) 40 MG capsule    Sig: TAKE 1 CAPSULE BY MOUTH EVERY DAY    Dispense:  90 capsule    Refill:  3  . rosuvastatin (CRESTOR) 40 MG tablet    Sig: Take 1 tablet (40 mg total) by mouth daily.    Dispense:  90 tablet    Refill:  1  . glipiZIDE (GLUCOTROL) 5 MG tablet    Sig: Take 1 tablet (5 mg total) by mouth 2 (two) times daily before a meal.    Dispense:  180 tablet    Refill:  3    **Patient requests 90 days supply**    Return in about 3 months (around 08/27/2015) for complete physical examiniation.   Kristi Elayne Guerin, M.D. [facility] [address] [place], [place]  [address] [phone] phone [phone] fax

## 2015-05-27 NOTE — Patient Instructions (Signed)

## 2015-05-28 LAB — HIV ANTIBODY (ROUTINE TESTING W REFLEX): HIV: NONREACTIVE

## 2015-05-28 LAB — HEPATITIS C ANTIBODY: HCV AB: REACTIVE — AB

## 2015-05-31 LAB — HEPATITIS C RNA QUANTITATIVE: HCV QUANT: NOT DETECTED [IU]/mL (ref <15)

## 2015-06-06 ENCOUNTER — Other Ambulatory Visit: Payer: Self-pay | Admitting: Family Medicine

## 2015-06-06 ENCOUNTER — Encounter: Payer: Self-pay | Admitting: Family Medicine

## 2015-06-06 NOTE — Telephone Encounter (Signed)
Pt seen by Dr. Tamala Julian lasts wk - will defer to her. . .  Pt did the same thing at his visit with me - not discuss it then request a refill electronically sev wks later.

## 2015-06-07 NOTE — Telephone Encounter (Signed)
Phoned in prescription

## 2015-06-07 NOTE — Telephone Encounter (Signed)
OK to refill Ambien as approved; please call in as out of the office today.

## 2015-08-24 ENCOUNTER — Telehealth: Payer: Self-pay

## 2015-08-24 NOTE — Telephone Encounter (Signed)
Pt called to check the status of [facility] rx change req from lipitor to crestor-generic. Form received by fax in Rx-inbox.

## 2015-08-25 NOTE — Telephone Encounter (Signed)
Pt called back and did say that he would like to try the generic lipitor because it is so much less expensive. Do you want to Rx it for him? I'm not sure what strength, or I would pend it.

## 2015-08-25 NOTE — Telephone Encounter (Signed)
Confusing situation. I received a PA req for name brand Crestor which pt has been requesting since 04/04/14. This is when we started Rxing it, and have sent it in as generic, but pt has req'd brand according to pharm. I called pharm twice to clarify all of this. I also got a fax from [facility] asking for a change from generic crestor (which pt has not been getting, but co-pay would be $47) to generic lipitor. I have LMOM for pt to call back to clarify what he wants. If he wants brand crestor, I need to know what statins he tried prior to coming here and why he doesn't want the generic.

## 2015-08-28 MED ORDER — ATORVASTATIN CALCIUM 40 MG PO TABS: 40.0000 mg | ORAL_TABLET | Freq: Every day | ORAL | Status: DC

## 2015-08-28 NOTE — Telephone Encounter (Signed)
Call --- I sent in Atorvastatin/Lipitor generic 40mg  one tablet daily.  Please advise patient.

## 2015-08-30 NOTE — Telephone Encounter (Signed)
Called to make sure that pharm had notified pt of new Rx and he stated that they did and he has started it.

## 2015-09-02 ENCOUNTER — Telehealth: Payer: Self-pay | Admitting: Family Medicine

## 2015-09-02 NOTE — Telephone Encounter (Signed)
Spoke with patient about his colonoscopy and eye exam.  He would like to hold off on both of these due to financial reasons.  He is willing to talk to Dr. Tamala Julian at his appointment on 09/10/15.

## 2015-09-09 ENCOUNTER — Encounter: Payer: Self-pay | Admitting: Family Medicine

## 2015-09-09 ENCOUNTER — Ambulatory Visit (INDEPENDENT_AMBULATORY_CARE_PROVIDER_SITE_OTHER): Payer: 59 | Admitting: Family Medicine

## 2015-09-09 VITALS — BP 114/70 | HR 54 | Temp 98.1°F | Resp 16 | Ht 70.0 in | Wt 244.0 lb

## 2015-09-09 DIAGNOSIS — Z125 Encounter for screening for malignant neoplasm of prostate: Secondary | ICD-10-CM | POA: Diagnosis not present

## 2015-09-09 DIAGNOSIS — E119 Type 2 diabetes mellitus without complications: Secondary | ICD-10-CM | POA: Diagnosis not present

## 2015-09-09 DIAGNOSIS — Z0000 Encounter for general adult medical examination without abnormal findings: Secondary | ICD-10-CM

## 2015-09-09 DIAGNOSIS — E785 Hyperlipidemia, unspecified: Secondary | ICD-10-CM | POA: Diagnosis not present

## 2015-09-09 DIAGNOSIS — I10 Essential (primary) hypertension: Secondary | ICD-10-CM | POA: Diagnosis not present

## 2015-09-09 DIAGNOSIS — F430 Acute stress reaction: Secondary | ICD-10-CM

## 2015-09-09 DIAGNOSIS — Z8551 Personal history of malignant neoplasm of bladder: Secondary | ICD-10-CM

## 2015-09-09 DIAGNOSIS — I25810 Atherosclerosis of coronary artery bypass graft(s) without angina pectoris: Secondary | ICD-10-CM | POA: Diagnosis not present

## 2015-09-09 DIAGNOSIS — R768 Other specified abnormal immunological findings in serum: Secondary | ICD-10-CM

## 2015-09-09 LAB — COMPREHENSIVE METABOLIC PANEL
ALK PHOS: 93 U/L (ref 40–115)
ALT: 13 U/L (ref 9–46)
AST: 18 U/L (ref 10–35)
Albumin: 4.1 g/dL (ref 3.6–5.1)
BILIRUBIN TOTAL: 0.6 mg/dL (ref 0.2–1.2)
BUN: 17 mg/dL (ref 7–25)
CALCIUM: 8.7 mg/dL (ref 8.6–10.3)
CO2: 24 mmol/L (ref 20–31)
Chloride: 104 mmol/L (ref 98–110)
Creat: 0.7 mg/dL (ref 0.70–1.33)
GLUCOSE: 137 mg/dL — AB (ref 65–99)
Potassium: 4.2 mmol/L (ref 3.5–5.3)
Sodium: 137 mmol/L (ref 135–146)
Total Protein: 6.7 g/dL (ref 6.1–8.1)

## 2015-09-09 LAB — CBC WITH DIFFERENTIAL/PLATELET
Basophils Absolute: 0 10*3/uL (ref 0.0–0.1)
Basophils Relative: 0 % (ref 0–1)
Eosinophils Absolute: 0.1 10*3/uL (ref 0.0–0.7)
Eosinophils Relative: 1 % (ref 0–5)
HCT: 44.5 % (ref 39.0–52.0)
HEMOGLOBIN: 15.2 g/dL (ref 13.0–17.0)
LYMPHS ABS: 3.3 10*3/uL (ref 0.7–4.0)
LYMPHS PCT: 39 % (ref 12–46)
MCH: 29.2 pg (ref 26.0–34.0)
MCHC: 34.2 g/dL (ref 30.0–36.0)
MCV: 85.6 fL (ref 78.0–100.0)
MONOS PCT: 9 % (ref 3–12)
MPV: 10.6 fL (ref 8.6–12.4)
Monocytes Absolute: 0.8 10*3/uL (ref 0.1–1.0)
NEUTROS ABS: 4.3 10*3/uL (ref 1.7–7.7)
NEUTROS PCT: 51 % (ref 43–77)
PLATELETS: 268 10*3/uL (ref 150–400)
RBC: 5.2 MIL/uL (ref 4.22–5.81)
RDW: 13.8 % (ref 11.5–15.5)
WBC: 8.5 10*3/uL (ref 4.0–10.5)

## 2015-09-09 LAB — POCT URINALYSIS DIP (MANUAL ENTRY)
Bilirubin, UA: NEGATIVE
Glucose, UA: NEGATIVE
Ketones, POC UA: NEGATIVE
Leukocytes, UA: NEGATIVE
Nitrite, UA: NEGATIVE
SPEC GRAV UA: 1.01
UROBILINOGEN UA: 0.2
pH, UA: 6

## 2015-09-09 LAB — POC MICROSCOPIC URINALYSIS (UMFC): Mucus: ABSENT

## 2015-09-09 LAB — LIPID PANEL
Cholesterol: 149 mg/dL (ref 125–200)
HDL: 28 mg/dL — AB (ref >=40)
LDL CALC: 87 mg/dL (ref <130)
TRIGLYCERIDES: 168 mg/dL — AB (ref <150)
Total CHOL/HDL Ratio: 5.3 Ratio — ABNORMAL HIGH (ref <=5.0)
VLDL: 34 mg/dL — AB (ref <30)

## 2015-09-09 LAB — TSH: TSH: 1.876 u[IU]/mL (ref 0.350–4.500)

## 2015-09-09 LAB — POCT GLYCOSYLATED HEMOGLOBIN (HGB A1C): Hemoglobin A1C: 9.3

## 2015-09-09 MED ORDER — ZOLPIDEM TARTRATE 10 MG PO TABS: 10.0000 mg | ORAL_TABLET | Freq: Every evening | ORAL | Status: DC | PRN

## 2015-09-09 MED ORDER — METHOCARBAMOL 500 MG PO TABS: 500.0000 mg | ORAL_TABLET | Freq: Three times a day (TID) | ORAL | Status: DC | PRN

## 2015-09-09 MED ORDER — DULOXETINE HCL 30 MG PO CPEP: 30.0000 mg | ORAL_CAPSULE | Freq: Every day | ORAL | Status: DC

## 2015-09-09 NOTE — Progress Notes (Signed)
Subjective:    Patient ID: Alexander Wolf, male    DOB: 11-16-1955, 60 y.o.   MRN: [id number]  09/09/2015  Annual Exam   HPI This 60 y.o. male presents for Complete Physical Examination.  Last physical:  unsure Colonoscopy:  2011; unsure.  [facility].  +polyps; repeat five years. TDAP:  2016 Pneumovax:  2016 Influenza:  2016 Eye exam:  Never; no glasses; years and years ago. [place]. Dental exam:  none    Review of Systems  Constitutional: Negative for fever, chills, diaphoresis, activity change, appetite change, fatigue and unexpected weight change.  HENT: Negative for congestion, dental problem, drooling, ear discharge, ear pain, facial swelling, hearing loss, mouth sores, nosebleeds, postnasal drip, rhinorrhea, sinus pressure, sneezing, sore throat, tinnitus, trouble swallowing and voice change.   Eyes: Negative for photophobia, pain, discharge, redness, itching and visual disturbance.  Respiratory: Negative for apnea, cough, choking, chest tightness, shortness of breath, wheezing and stridor.   Cardiovascular: Negative for chest pain, palpitations and leg swelling.  Gastrointestinal: Negative for nausea, vomiting, abdominal pain, diarrhea, constipation and blood in stool.  Endocrine: Negative for cold intolerance, heat intolerance, polydipsia, polyphagia and polyuria.  Genitourinary: Negative for dysuria, urgency, frequency, hematuria, flank pain, decreased urine volume, discharge, penile swelling, scrotal swelling, enuresis, difficulty urinating, genital sores, penile pain and testicular pain.  Musculoskeletal: Negative for myalgias, back pain, joint swelling, arthralgias, gait problem, neck pain and neck stiffness.  Skin: Negative for color change, pallor, rash and wound.  Allergic/Immunologic: Negative for environmental allergies, food allergies and immunocompromised state.  Neurological: Negative for dizziness, tremors, seizures, syncope, facial asymmetry, speech difficulty,  weakness, light-headedness, numbness and headaches.  Hematological: Negative for adenopathy. Does not bruise/bleed easily.  Psychiatric/Behavioral: Negative for suicidal ideas, hallucinations, behavioral problems, confusion, sleep disturbance, self-injury, dysphoric mood, decreased concentration and agitation. The patient is not nervous/anxious and is not hyperactive.     Past Medical History  Diagnosis Date  . Diabetes mellitus without complication ([place])   . GERD (gastroesophageal reflux disease)   . Hyperlipidemia   . Hypertension   . Insomnia   . Coronary artery disease     s/p CABG  . Cancer [facility])     Bladder cancer; [facility] in [place], [place];    Past Surgical History  Procedure Laterality Date  . Coronary artery bypass graft    . Bladder surgery    . Tonsillectomy     No Known Allergies Current Outpatient Prescriptions  Medication Sig Dispense Refill  . aspirin 325 MG tablet Take 325 mg by mouth daily.    Marland Kitchen atorvastatin (LIPITOR) 40 MG tablet Take 1 tablet (40 mg total) by mouth daily. 90 tablet 3  . glipiZIDE (GLUCOTROL) 5 MG tablet Take 1 tablet (5 mg total) by mouth 2 (two) times daily before a meal. 180 tablet 3  . lisinopril (PRINIVIL,ZESTRIL) 20 MG tablet Take 1 tablet (20 mg total) by mouth daily. 90 tablet 1  . metFORMIN (GLUCOPHAGE) 1000 MG tablet Take 1 tablet (1,000 mg total) by mouth 2 (two) times daily with a meal. 180 tablet 1  . metoprolol (LOPRESSOR) 50 MG tablet Take 1/2 tab PO BID 90 tablet 1  . Omega-3 Fatty Acids (FISH OIL) 300 MG CAPS Take 600 capsules by mouth.    Marland Kitchen omeprazole (PRILOSEC) 40 MG capsule TAKE 1 CAPSULE BY MOUTH EVERY DAY 90 capsule 3  . zolpidem (AMBIEN) 10 MG tablet TAKE 1 TABLET BY MOUTH AT BEDTIME AS NEEDED FOR SLEEP 30 tablet 3  .  methocarbamol (ROBAXIN) 500 MG tablet Take 1-2 tablets (500-1,000 mg total) by mouth every 8 (eight) hours as needed for muscle spasms. 60 tablet 3   No current facility-administered medications for this visit.     Social History   Social History  . Marital Status: Single    Spouse Name: N/A  . Number of Children: 3  . Years of Education: N/A   Occupational History  . welder    Social History Main Topics  . Smoking status: Never Smoker   . Smokeless tobacco: Not on file  . Alcohol Use: No  . Drug Use: No  . Sexual Activity: Yes    Birth Control/ Protection: Abstinence   Other Topics Concern  . Not on file   Social History Narrative   Marital status: married x 12 years; second marriage.      Children: 3 children; 3 grandchildren; no gg      Lives: with wife      Employment:  Building control surveyor; Architect; 40 hours per week      Tobacco: quit in 3 years ago in 2013; smoked 45 years      Alcohol:  None      Drugs:  None      Exercise: works hard every day      Seatbelt:  100%; no texting while driving        Family History  Problem Relation Age of Onset  . Varicose Veins Mother        Objective:    BP 114/70 mmHg  Pulse 54  Temp(Src) 98.1 F (36.7 C) (Oral)  Resp 16  Ht 5\' 10"  (1.778 m)  Wt 244 lb (110.678 kg)  BMI 35.01 kg/m2  SpO2 97% Physical Exam  Constitutional: He is oriented to person, place, and time. He appears well-developed and well-nourished. No distress.  HENT:  Head: Normocephalic and atraumatic.  Right Ear: External ear normal.  Left Ear: External ear normal.  Nose: Nose normal.  Mouth/Throat: Oropharynx is clear and moist.  Eyes: Conjunctivae and EOM are normal. Pupils are equal, round, and reactive to light.  Neck: Normal range of motion. Neck supple. Carotid bruit is not present. No thyromegaly present.  Cardiovascular: Normal rate, regular rhythm, normal heart sounds and intact distal pulses.  Exam reveals no gallop and no friction rub.   No murmur heard. Pulmonary/Chest: Effort normal and breath sounds normal. He has no wheezes. He has no rales.  Abdominal: Soft. Bowel sounds are normal. He exhibits no distension and no mass. There is no tenderness.  There is no rebound and no guarding.  Musculoskeletal:       Right shoulder: Normal.       Left shoulder: Normal.       Cervical back: Normal.  Lymphadenopathy:    He has no cervical adenopathy.  Neurological: He is alert and oriented to person, place, and time. He has normal reflexes. No cranial nerve deficit. He exhibits normal muscle tone. Coordination normal.  Skin: Skin is warm and dry. No rash noted. He is not diaphoretic.  Psychiatric: He has a normal mood and affect. His behavior is normal. Judgment and thought content normal.   Results for orders placed or performed in visit on 09/09/15  POCT urinalysis dipstick  Result Value Ref Range   Color, UA yellow yellow   Clarity, UA clear clear   Glucose, UA negative negative   Bilirubin, UA negative negative   Ketones, POC UA negative negative   Spec Grav, UA 1.010  Blood, UA moderate (A) negative   pH, UA 6.0    Protein Ur, POC trace (A) negative   Urobilinogen, UA 0.2    Nitrite, UA Negative Negative   Leukocytes, UA Negative Negative  POCT Microscopic Urinalysis ([facility])  Result Value Ref Range   WBC,UR,HPF,POC Moderate (A) None WBC/hpf   RBC,UR,HPF,POC Few (A) None RBC/hpf   Bacteria Few (A) None, Too numerous to count   Mucus Absent Absent   Epithelial Cells, UR Per Microscopy Few (A) None, Too numerous to count cells/hpf  POCT glycosylated hemoglobin (Hb A1C)  Result Value Ref Range   Hemoglobin A1C 9.3        Assessment & Plan:   1. Routine physical examination   2. Hx of bladder cancer   3. Essential hypertension   4. Coronary artery disease involving coronary bypass graft of native heart without angina pectoris   5. Type 2 diabetes mellitus not at goal [facility])   6. Hyperlipidemia LDL goal <70   7. Hepatitis C antibody test positive   8. Prostate cancer screening   9. Stress reaction     Orders Placed This Encounter  Procedures  . CBC with Differential/Platelet  . Comprehensive metabolic panel    Order  Specific Question:  Has the patient fasted?    Answer:  Yes  . Lipid panel    Order Specific Question:  Has the patient fasted?    Answer:  Yes  . TSH  . HCV Viral RNA Gen3 NS5a Drug Resist  . PSA  . POCT urinalysis dipstick  . POCT Microscopic Urinalysis ([facility])  . POCT glycosylated hemoglobin (Hb A1C)  . EKG 12-Lead   Meds ordered this encounter  Medications  . methocarbamol (ROBAXIN) 500 MG tablet    Sig: Take 1-2 tablets (500-1,000 mg total) by mouth every 8 (eight) hours as needed for muscle spasms.    Dispense:  60 tablet    Refill:  3    Return in about 3 months (around 12/08/2015) for recheck.    Denita Lun Elayne Guerin, M.D. [facility] [address] [place], [place]  [address] [phone] phone [phone] fax

## 2015-09-09 NOTE — Patient Instructions (Signed)

## 2015-09-09 NOTE — Progress Notes (Signed)
   Subjective:    Patient ID: Alexander Wolf, male    DOB: June 28, 1956, 60 y.o.   MRN: [id number]  HPI    Review of Systems  Constitutional: Negative.   Eyes: Negative.   Respiratory: Negative.   Cardiovascular: Negative.   Gastrointestinal: Negative.   Endocrine: Negative.   Genitourinary: Negative.   Musculoskeletal: Negative.   Skin: Negative.   Allergic/Immunologic: Negative.   Neurological: Positive for headaches.  Hematological: Negative.   Psychiatric/Behavioral: Negative.        Objective:   Physical Exam        Assessment & Plan:

## 2015-09-10 LAB — PSA: PSA: 0.71 ng/mL (ref <=4.00)

## 2015-09-19 ENCOUNTER — Encounter: Payer: Self-pay | Admitting: Family Medicine

## 2016-03-05 ENCOUNTER — Ambulatory Visit (INDEPENDENT_AMBULATORY_CARE_PROVIDER_SITE_OTHER): Payer: 59 | Admitting: Physician Assistant

## 2016-03-05 ENCOUNTER — Other Ambulatory Visit: Payer: Self-pay | Admitting: Physician Assistant

## 2016-03-05 VITALS — BP 122/78 | HR 74 | Temp 98.0°F | Resp 16 | Ht 70.0 in | Wt 244.0 lb

## 2016-03-05 DIAGNOSIS — N3000 Acute cystitis without hematuria: Secondary | ICD-10-CM | POA: Diagnosis not present

## 2016-03-05 DIAGNOSIS — R350 Frequency of micturition: Secondary | ICD-10-CM

## 2016-03-05 DIAGNOSIS — Z8551 Personal history of malignant neoplasm of bladder: Secondary | ICD-10-CM | POA: Diagnosis not present

## 2016-03-05 DIAGNOSIS — I10 Essential (primary) hypertension: Secondary | ICD-10-CM

## 2016-03-05 DIAGNOSIS — R39198 Other difficulties with micturition: Secondary | ICD-10-CM

## 2016-03-05 DIAGNOSIS — I25810 Atherosclerosis of coronary artery bypass graft(s) without angina pectoris: Secondary | ICD-10-CM | POA: Diagnosis not present

## 2016-03-05 DIAGNOSIS — E119 Type 2 diabetes mellitus without complications: Secondary | ICD-10-CM

## 2016-03-05 LAB — LIPID PANEL
CHOL/HDL RATIO: 4 ratio (ref <=5.0)
CHOLESTEROL: 148 mg/dL (ref 125–200)
HDL: 37 mg/dL — ABNORMAL LOW (ref >=40)
LDL Cholesterol: 70 mg/dL (ref <130)
Triglycerides: 205 mg/dL — ABNORMAL HIGH (ref <150)
VLDL: 41 mg/dL — ABNORMAL HIGH (ref <30)

## 2016-03-05 LAB — POC MICROSCOPIC URINALYSIS (UMFC): MUCUS RE: ABSENT

## 2016-03-05 LAB — POCT CBC
Granulocyte percent: 59.7 %G (ref 37–80)
HCT, POC: 44.1 % (ref 43.5–53.7)
Hemoglobin: 15 g/dL (ref 14.1–18.1)
LYMPH, POC: 4.1 — AB (ref 0.6–3.4)
MCH, POC: 28.7 pg (ref 27–31.2)
MCHC: 34.1 g/dL (ref 31.8–35.4)
MCV: 84.2 fL (ref 80–97)
MID (CBC): 1.1 — AB (ref 0–0.9)
MPV: 7.9 fL (ref 0–99.8)
PLATELET COUNT, POC: 269 10*3/uL (ref 142–424)
POC Granulocyte: 7.6 — AB (ref 2–6.9)
POC LYMPH %: 32 % (ref 10–50)
POC MID %: 8.3 % (ref 0–12)
RBC: 5.23 M/uL (ref 4.69–6.13)
RDW, POC: 14.2 %
WBC: 12.7 10*3/uL — AB (ref 4.6–10.2)

## 2016-03-05 LAB — POCT URINALYSIS DIP (MANUAL ENTRY)
Bilirubin, UA: NEGATIVE
Ketones, POC UA: NEGATIVE
Leukocytes, UA: NEGATIVE
NITRITE UA: POSITIVE — AB
Spec Grav, UA: 1.01
UROBILINOGEN UA: 0.2
pH, UA: 6.5

## 2016-03-05 LAB — COMPLETE METABOLIC PANEL WITH GFR
ALBUMIN: 4.7 g/dL (ref 3.6–5.1)
ALK PHOS: 101 U/L (ref 40–115)
ALT: 8 U/L — AB (ref 9–46)
AST: 14 U/L (ref 10–35)
BILIRUBIN TOTAL: 0.8 mg/dL (ref 0.2–1.2)
BUN: 14 mg/dL (ref 7–25)
CALCIUM: 9.6 mg/dL (ref 8.6–10.3)
CO2: 28 mmol/L (ref 20–31)
CREATININE: 0.78 mg/dL (ref 0.70–1.25)
Chloride: 99 mmol/L (ref 98–110)
GFR, Est African American: >89 mL/min (ref >=60)
GFR, Est Non African American: >89 mL/min (ref >=60)
GLUCOSE: 98 mg/dL (ref 65–99)
POTASSIUM: 4.2 mmol/L (ref 3.5–5.3)
SODIUM: 139 mmol/L (ref 135–146)
TOTAL PROTEIN: 7 g/dL (ref 6.1–8.1)

## 2016-03-05 LAB — HEMOGLOBIN A1C
Hgb A1c MFr Bld: 8.3 % — ABNORMAL HIGH (ref <5.7)
Mean Plasma Glucose: 192 mg/dL

## 2016-03-05 MED ORDER — TAMSULOSIN HCL 0.4 MG PO CAPS: 0.4000 mg | ORAL_CAPSULE | Freq: Every day | ORAL | 0 refills | Status: DC

## 2016-03-05 MED ORDER — CIPROFLOXACIN HCL 500 MG PO TABS: 500.0000 mg | ORAL_TABLET | Freq: Two times a day (BID) | ORAL | 0 refills | Status: DC

## 2016-03-05 NOTE — Progress Notes (Signed)
This encounter was created in error - please disregard.

## 2016-03-05 NOTE — Patient Instructions (Signed)
     IF you received an x-ray today, you will receive an invoice from [facility]. Please contact [facility] at [phone] with questions or concerns regarding your invoice.   IF you received labwork today, you will receive an invoice from [facility]. Please contact [facility] at [phone] with questions or concerns regarding your invoice.   Our billing staff will not be able to assist you with questions regarding bills from these companies.  You will be contacted with the lab results as soon as they are available. The fastest way to get your results is to activate your My Chart account. Instructions are located on the last page of this paperwork. If you have not heard from us regarding the results in 2 weeks, please contact this office.      

## 2016-03-05 NOTE — Progress Notes (Signed)
Alexander Wolf  MRN: [id number] DOB: 1956-04-01  Subjective:  Pt presents to clinic 5 day h/o urinary problems - started with a dribbling and urethral irritation (he thinks coming from multiple bathroom trips) - he is not sleeping at night because he is up using the restroom - he is now having some dysuria but no hematuria - feels like he has bladder pressure - no N/V/D  No change in sexual partner - no concerns of STDs  Bladder cancer - diagnosed 2 years ago - urologist at [facility] but he has not been back there and does not plan - he was referred to urologist at [place] but he never went and would be ok with another referral there - up until 5 days ago his urine stream had not changed  Normal PSA in 1/17  Review of Systems  Genitourinary: Positive for frequency, penile pain and urgency. Negative for discharge and hematuria.    Patient Active Problem List   Diagnosis Date Noted  . Coronary artery disease involving coronary bypass graft of native heart without angina pectoris 04/04/2014  . Essential hypertension 04/04/2014  . Hyperlipidemia LDL goal <70 04/04/2014  . Type 2 diabetes mellitus not at goal [facility]) 04/04/2014  . Hx of bladder cancer 04/04/2014    Current Outpatient Prescriptions on File Prior to Visit  Medication Sig Dispense Refill  . aspirin 325 MG tablet Take 325 mg by mouth daily.    Marland Kitchen atorvastatin (LIPITOR) 40 MG tablet Take 1 tablet (40 mg total) by mouth daily. 90 tablet 3  . glipiZIDE (GLUCOTROL) 5 MG tablet Take 1 tablet (5 mg total) by mouth 2 (two) times daily before a meal. 180 tablet 3  . lisinopril (PRINIVIL,ZESTRIL) 20 MG tablet Take 1 tablet (20 mg total) by mouth daily. 90 tablet 1  . metFORMIN (GLUCOPHAGE) 1000 MG tablet Take 1 tablet (1,000 mg total) by mouth 2 (two) times daily with a meal. 180 tablet 1  . metoprolol (LOPRESSOR) 50 MG tablet Take 1/2 tab PO BID 90 tablet 1  . Omega-3 Fatty Acids (FISH OIL) 300 MG CAPS Take 600 capsules by mouth.      Marland Kitchen omeprazole (PRILOSEC) 40 MG capsule TAKE 1 CAPSULE BY MOUTH EVERY DAY 90 capsule 3  . zolpidem (AMBIEN) 10 MG tablet Take 1 tablet (10 mg total) by mouth at bedtime as needed. for sleep 30 tablet 5   No current facility-administered medications on file prior to visit.     No Known Allergies  Objective:  BP 122/78 (BP Location: Right Arm)   Pulse 74   Temp 98 F (36.7 C) (Oral)   Resp 16   Ht 5\' 10"  (1.778 m)   Wt 244 lb (110.7 kg)   SpO2 97%   BMI 35.01 kg/m   Physical Exam  Constitutional: He is oriented to person, place, and time and well-developed, well-nourished, and in no distress.  HENT:  Head: Normocephalic and atraumatic.  Right Ear: External ear normal.  Left Ear: External ear normal.  Eyes: Conjunctivae are normal.  Neck: Normal range of motion.  Cardiovascular: Normal rate, regular rhythm and normal heart sounds.   No murmur heard. Pulmonary/Chest: Effort normal and breath sounds normal. He has no wheezes.  Abdominal: Soft. There is tenderness (suprapubic - pt has guarding in this area). There is guarding and CVA tenderness.  Genitourinary: Testes/scrotum normal and penis normal. He exhibits no testicular tenderness and no scrotal tenderness.  Neurological: He is alert and oriented to person, place, and  time. Gait normal.  Skin: Skin is warm and dry.  Psychiatric: Mood, memory, affect and judgment normal.   Results for orders placed or performed in visit on 03/05/16  POCT Microscopic Urinalysis ([facility])  Result Value Ref Range   WBC,UR,HPF,POC Moderate (A) None WBC/hpf   RBC,UR,HPF,POC Moderate (A) None RBC/hpf   Bacteria None None, Too numerous to count   Mucus Absent Absent   Epithelial Cells, UR Per Microscopy Few (A) None, Too numerous to count cells/hpf  POCT urinalysis dipstick  Result Value Ref Range   Color, UA yellow yellow   Clarity, UA clear clear   Glucose, UA =250 (A) negative   Bilirubin, UA negative negative   Ketones, POC UA negative  negative   Spec Grav, UA 1.010    Blood, UA moderate (A) negative   pH, UA 6.5    Protein Ur, POC trace (A) negative   Urobilinogen, UA 0.2    Nitrite, UA Positive (A) Negative   Leukocytes, UA Negative Negative  POCT CBC  Result Value Ref Range   WBC 12.7 (A) 4.6 - 10.2 K/uL   Lymph, poc 4.1 (A) 0.6 - 3.4   POC LYMPH PERCENT 32.0 10 - 50 %L   MID (cbc) 1.1 (A) 0 - 0.9   POC MID % 8.3 0 - 12 %M   POC Granulocyte 7.6 (A) 2 - 6.9   Granulocyte percent 59.7 37 - 80 %G   RBC 5.23 4.69 - 6.13 M/uL   Hemoglobin 15.0 14.1 - 18.1 g/dL   HCT, POC 44.1 43.5 - 53.7 %   MCV 84.2 80 - 97 fL   MCH, POC 28.7 27 - 31.2 pg   MCHC 34.1 31.8 - 35.4 g/dL   RDW, POC 14.2 %   Platelet Count, POC 269 142 - 424 K/uL   MPV 7.9 0 - 99.8 fL     Assessment and Plan :  Coronary artery disease involving coronary bypass graft of native heart without angina pectoris - Plan: Lipid panel  Essential hypertension - Plan: COMPLETE METABOLIC PANEL WITH GFR  Abnormal urinary stream - Plan: Ambulatory referral to Urology, tamsulosin (FLOMAX) 0.4 MG CAPS capsule - start this to help with urine stream  Urinary frequency - Plan: POCT Microscopic Urinalysis ([facility]), POCT urinalysis dipstick, PSA, POCT CBC, Ambulatory referral to Urology, Urine culture  Type 2 diabetes mellitus not at goal [facility]) - Plan: Hemoglobin A1c  Hx of bladder cancer - Plan: Ambulatory referral to Urology  Acute cystitis without hematuria - Plan: ciprofloxacin (CIPRO) 500 MG tablet    Windell Hummingbird PA-C  [facility] 03/05/2016 3:10 PM

## 2016-03-06 ENCOUNTER — Encounter (HOSPITAL_COMMUNITY): Payer: Self-pay

## 2016-03-06 ENCOUNTER — Emergency Department (HOSPITAL_COMMUNITY)
Admission: EM | Admit: 2016-03-06 | Discharge: 2016-03-06 | Disposition: A | Discharge status: Home / Self Care | Payer: 59 | Attending: Emergency Medicine | Admitting: Emergency Medicine

## 2016-03-06 DIAGNOSIS — Z8719 Personal history of other diseases of the digestive system: Secondary | ICD-10-CM | POA: Diagnosis not present

## 2016-03-06 DIAGNOSIS — I2510 Atherosclerotic heart disease of native coronary artery without angina pectoris: Secondary | ICD-10-CM | POA: Diagnosis not present

## 2016-03-06 DIAGNOSIS — Z79899 Other long term (current) drug therapy: Secondary | ICD-10-CM | POA: Insufficient documentation

## 2016-03-06 DIAGNOSIS — E785 Hyperlipidemia, unspecified: Secondary | ICD-10-CM | POA: Diagnosis not present

## 2016-03-06 DIAGNOSIS — Z7982 Long term (current) use of aspirin: Secondary | ICD-10-CM | POA: Insufficient documentation

## 2016-03-06 DIAGNOSIS — R339 Retention of urine, unspecified: Secondary | ICD-10-CM | POA: Diagnosis present

## 2016-03-06 DIAGNOSIS — E119 Type 2 diabetes mellitus without complications: Secondary | ICD-10-CM | POA: Diagnosis not present

## 2016-03-06 DIAGNOSIS — Z955 Presence of coronary angioplasty implant and graft: Secondary | ICD-10-CM | POA: Diagnosis not present

## 2016-03-06 DIAGNOSIS — Z8551 Personal history of malignant neoplasm of bladder: Secondary | ICD-10-CM | POA: Insufficient documentation

## 2016-03-06 DIAGNOSIS — I10 Essential (primary) hypertension: Secondary | ICD-10-CM | POA: Diagnosis not present

## 2016-03-06 DIAGNOSIS — Z7984 Long term (current) use of oral hypoglycemic drugs: Secondary | ICD-10-CM | POA: Insufficient documentation

## 2016-03-06 DIAGNOSIS — N390 Urinary tract infection, site not specified: Secondary | ICD-10-CM | POA: Diagnosis not present

## 2016-03-06 DIAGNOSIS — Z792 Long term (current) use of antibiotics: Secondary | ICD-10-CM | POA: Diagnosis not present

## 2016-03-06 DIAGNOSIS — R338 Other retention of urine: Secondary | ICD-10-CM

## 2016-03-06 LAB — URINALYSIS, ROUTINE W REFLEX MICROSCOPIC
Bilirubin Urine: NEGATIVE
GLUCOSE, UA: 100 mg/dL — AB
Ketones, ur: NEGATIVE mg/dL
Nitrite: POSITIVE — AB
PROTEIN: 30 mg/dL — AB
SPECIFIC GRAVITY, URINE: 1.011 (ref 1.005–1.030)
pH: 5.5 (ref 5.0–8.0)

## 2016-03-06 LAB — URINE MICROSCOPIC-ADD ON

## 2016-03-06 LAB — PSA: PSA: 1.31 ng/mL (ref <=4.00)

## 2016-03-06 NOTE — Progress Notes (Signed)
This encounter was created in error - please disregard.

## 2016-03-06 NOTE — ED Triage Notes (Signed)
Pt was dx with a bladder infection yesterday by primary Dr

## 2016-03-06 NOTE — ED Triage Notes (Signed)
Pt complains of urinary retention [place] the [place]

## 2016-03-06 NOTE — ED Provider Notes (Signed)
[place] DEPT Provider Note   CSN: [id number] Arrival date & time: 03/06/16  [id number]  First Provider Contact:  First MD Initiated Contact with Patient 03/06/16 [phone]        History   Chief Complaint Chief Complaint  Patient presents with  . Urinary Retention    HPI Alexander Wolf is a 60 y.o. male with a several day history of urinary urgency, urinary frequency and suprapubic discomfort. He was seen by his PCP yesterday and diagnosed with urinary tract infection. He was placed on ciprofloxacin. Overnight his suprapubic pain worsened and he was unable to urinate. By about 4 AM his pain had become severe and he came to the emergency department where he was found to have a significantly distended bladder. Nursing staff placed a Foley catheter and he drained approximately 1400 milliliters of urine. His discomfort is significantly improved now. He has had a subjective fever, chills, nausea associated with the pain which has subsequently resolved, and diarrhea. He has an appointment scheduled with his urologist.  HPI    Past Medical History:  Diagnosis Date  . Cancer [facility])    Bladder cancer; [facility] in [place], [place];   . Coronary artery disease    s/p CABG  . Diabetes mellitus without complication ([place])   . GERD (gastroesophageal reflux disease)   . Hyperlipidemia   . Hypertension   . Insomnia     Patient Active Problem List   Diagnosis Date Noted  . Coronary artery disease involving coronary bypass graft of native heart without angina pectoris 04/04/2014  . Essential hypertension 04/04/2014  . Hyperlipidemia LDL goal <70 04/04/2014  . Type 2 diabetes mellitus not at goal [facility]) 04/04/2014  . Hx of bladder cancer 04/04/2014    Past Surgical History:  Procedure Laterality Date  . BLADDER SURGERY    . CORONARY ARTERY BYPASS GRAFT    . tonsillectomy         Home Medications    Prior to Admission medications   Medication Sig Start Date End Date Taking? Authorizing  Provider  aspirin 325 MG tablet Take 325 mg by mouth daily.   Yes Historical Provider, MD  atorvastatin (LIPITOR) 40 MG tablet Take 1 tablet (40 mg total) by mouth daily. 08/28/15  Yes Wardell Honour, MD  ciprofloxacin (CIPRO) 500 MG tablet Take 1 tablet (500 mg total) by mouth 2 (two) times daily. 03/05/16  Yes Mancel Bale, PA-C  glipiZIDE (GLUCOTROL) 5 MG tablet Take 1 tablet (5 mg total) by mouth 2 (two) times daily before a meal. 05/27/15  Yes Wardell Honour, MD  lisinopril (PRINIVIL,ZESTRIL) 20 MG tablet Take 1 tablet (20 mg total) by mouth daily. 05/27/15  Yes Wardell Honour, MD  metFORMIN (GLUCOPHAGE) 1000 MG tablet Take 1 tablet (1,000 mg total) by mouth 2 (two) times daily with a meal. 05/27/15  Yes Wardell Honour, MD  metoprolol (LOPRESSOR) 50 MG tablet Take 1/2 tab PO BID 05/27/15  Yes Wardell Honour, MD  Omega-3 Fatty Acids (FISH OIL) 300 MG CAPS Take 600 capsules by mouth.   Yes Historical Provider, MD  omeprazole (PRILOSEC) 40 MG capsule TAKE 1 CAPSULE BY MOUTH EVERY DAY 05/27/15  Yes Wardell Honour, MD  tamsulosin (FLOMAX) 0.4 MG CAPS capsule Take 1 capsule (0.4 mg total) by mouth daily. 03/05/16  Yes Mancel Bale, PA-C  zolpidem (AMBIEN) 10 MG tablet Take 1 tablet (10 mg total) by mouth at bedtime as needed. for sleep 09/09/15  Yes Steffanie Dunn  Koren Bound, MD    Family History Family History  Problem Relation Age of Onset  . Varicose Veins Mother     Social History Social History  Substance Use Topics  . Smoking status: Never Smoker  . Smokeless tobacco: Never Used  . Alcohol use No     Allergies   Review of patient's allergies indicates no known allergies.   Review of Systems Review of Systems  All other systems reviewed and are negative.   Physical Exam Updated Vital Signs BP 147/85 (BP Location: Left Arm)   Pulse 84   Temp 98.6 F (37 C) (Oral)   Resp 22   Ht 5\' 10"  (1.778 m)   Wt 244 lb (110.7 kg)   SpO2 97%   BMI 35.01 kg/m   Physical Exam General:  Well-developed, well-nourished male in no acute distress; appearance consistent with age of record HENT: normocephalic; atraumatic Eyes: pupils equal, round and reactive to light; extraocular muscles intact Neck: supple Heart: regular rate and rhythm Lungs: clear to auscultation bilaterally Abdomen: soft; nondistended; suprapubic tenderness; no masses or hepatosplenomegaly; bowel sounds present GU: Foley catheter in place draining amber urine Extremities: No deformity; full range of motion; pulses normal Neurologic: Awake, alert and oriented; motor function intact in all extremities and symmetric; no facial droop Skin: Warm and dry Psychiatric: Normal mood and affect    ED Treatments / Results   Nursing notes and vitals signs, including pulse oximetry, reviewed.  Summary of this visit's results, reviewed by myself:  Labs:  Results for orders placed or performed during the hospital encounter of 03/06/16 (from the past 24 hour(s))  Urinalysis, Routine w reflex microscopic- may I&O cath if menses     Status: Abnormal   Collection Time: 03/06/16  5:43 AM  Result Value Ref Range   Color, Urine ORANGE (A) YELLOW   APPearance CLOUDY (A) CLEAR   Specific Gravity, Urine 1.011 1.005 - 1.030   pH 5.5 5.0 - 8.0   Glucose, UA 100 (A) NEGATIVE mg/dL   Hgb urine dipstick LARGE (A) NEGATIVE   Bilirubin Urine NEGATIVE NEGATIVE   Ketones, ur NEGATIVE NEGATIVE mg/dL   Protein, ur 30 (A) NEGATIVE mg/dL   Nitrite POSITIVE (A) NEGATIVE   Leukocytes, UA TRACE (A) NEGATIVE  Urine microscopic-add on     Status: Abnormal   Collection Time: 03/06/16  5:43 AM  Result Value Ref Range   Squamous Epithelial / LPF 0-5 (A) NONE SEEN   WBC, UA 6-30 0 - 5 WBC/hpf   RBC / HPF TOO NUMEROUS TO COUNT 0 - 5 RBC/hpf   Bacteria, UA FEW (A) NONE SEEN    Procedures (including critical care time)   Final Clinical Impressions(s) / ED Diagnoses   Final diagnoses:  Acute urinary retention  UTI (lower urinary  tract infection)      Shanon Rosser, MD 03/06/16 [phone]

## 2016-03-07 LAB — URINE CULTURE: CULTURE: NO GROWTH

## 2016-03-07 NOTE — Progress Notes (Signed)
This encounter was created in error - please disregard.

## 2016-03-10 ENCOUNTER — Emergency Department (HOSPITAL_COMMUNITY)
Admission: EM | Admit: 2016-03-10 | Discharge: 2016-03-10 | Disposition: A | Discharge status: Home / Self Care | Payer: 59 | Attending: Emergency Medicine | Admitting: Emergency Medicine

## 2016-03-10 ENCOUNTER — Ambulatory Visit (INDEPENDENT_AMBULATORY_CARE_PROVIDER_SITE_OTHER): Payer: 59 | Admitting: Family Medicine

## 2016-03-10 ENCOUNTER — Encounter: Payer: Self-pay | Admitting: Family Medicine

## 2016-03-10 ENCOUNTER — Encounter (HOSPITAL_COMMUNITY): Payer: Self-pay | Admitting: *Deleted

## 2016-03-10 VITALS — BP 152/80 | HR 93 | Temp 98.1°F | Resp 18 | Ht 70.0 in | Wt 242.6 lb

## 2016-03-10 DIAGNOSIS — R338 Other retention of urine: Secondary | ICD-10-CM | POA: Diagnosis not present

## 2016-03-10 DIAGNOSIS — I10 Essential (primary) hypertension: Secondary | ICD-10-CM | POA: Insufficient documentation

## 2016-03-10 DIAGNOSIS — Z7982 Long term (current) use of aspirin: Secondary | ICD-10-CM | POA: Diagnosis not present

## 2016-03-10 DIAGNOSIS — R140 Abdominal distension (gaseous): Secondary | ICD-10-CM | POA: Diagnosis not present

## 2016-03-10 DIAGNOSIS — Z792 Long term (current) use of antibiotics: Secondary | ICD-10-CM | POA: Insufficient documentation

## 2016-03-10 DIAGNOSIS — Z8551 Personal history of malignant neoplasm of bladder: Secondary | ICD-10-CM | POA: Insufficient documentation

## 2016-03-10 DIAGNOSIS — Z7984 Long term (current) use of oral hypoglycemic drugs: Secondary | ICD-10-CM | POA: Diagnosis not present

## 2016-03-10 DIAGNOSIS — Z955 Presence of coronary angioplasty implant and graft: Secondary | ICD-10-CM | POA: Diagnosis not present

## 2016-03-10 DIAGNOSIS — I2510 Atherosclerotic heart disease of native coronary artery without angina pectoris: Secondary | ICD-10-CM | POA: Diagnosis not present

## 2016-03-10 DIAGNOSIS — R339 Retention of urine, unspecified: Secondary | ICD-10-CM | POA: Diagnosis not present

## 2016-03-10 DIAGNOSIS — R350 Frequency of micturition: Secondary | ICD-10-CM | POA: Diagnosis not present

## 2016-03-10 DIAGNOSIS — Z79899 Other long term (current) drug therapy: Secondary | ICD-10-CM | POA: Diagnosis not present

## 2016-03-10 DIAGNOSIS — Z960 Presence of urogenital implants: Secondary | ICD-10-CM | POA: Diagnosis not present

## 2016-03-10 DIAGNOSIS — Z978 Presence of other specified devices: Secondary | ICD-10-CM

## 2016-03-10 DIAGNOSIS — E119 Type 2 diabetes mellitus without complications: Secondary | ICD-10-CM | POA: Diagnosis not present

## 2016-03-10 LAB — POCT URINALYSIS DIP (MANUAL ENTRY)
Bilirubin, UA: NEGATIVE
Glucose, UA: 100 — AB
Ketones, POC UA: NEGATIVE
Nitrite, UA: NEGATIVE
PH UA: 6
SPEC GRAV UA: 1.02
UROBILINOGEN UA: 0.2

## 2016-03-10 LAB — URINALYSIS, ROUTINE W REFLEX MICROSCOPIC
Bilirubin Urine: NEGATIVE
GLUCOSE, UA: 100 mg/dL — AB
Ketones, ur: NEGATIVE mg/dL
LEUKOCYTES UA: NEGATIVE
Nitrite: NEGATIVE
PH: 6 (ref 5.0–8.0)
Protein, ur: 100 mg/dL — AB
SPECIFIC GRAVITY, URINE: 1.017 (ref 1.005–1.030)

## 2016-03-10 LAB — URINE MICROSCOPIC-ADD ON

## 2016-03-10 LAB — POC MICROSCOPIC URINALYSIS (UMFC)

## 2016-03-10 MED ORDER — DIAZEPAM 5 MG PO TABS
5.0000 mg | ORAL_TABLET | Freq: Once | ORAL | Status: AC
  Administered 2016-03-10: 5 mg via ORAL
  Filled 2016-03-10: qty 1

## 2016-03-10 MED ORDER — LIDOCAINE HCL 2 % EX GEL
1.0000 "application " | Freq: Once | CUTANEOUS | Status: DC
  Filled 2016-03-10: qty 11

## 2016-03-10 MED ORDER — OXYCODONE-ACETAMINOPHEN 5-325 MG PO TABS
1.0000 | ORAL_TABLET | Freq: Once | ORAL | Status: AC
  Administered 2016-03-10: 1 via ORAL
  Filled 2016-03-10: qty 1

## 2016-03-10 MED ORDER — OXYCODONE-ACETAMINOPHEN 5-325 MG PO TABS: 1.0000 | ORAL_TABLET | ORAL | 0 refills | Status: DC | PRN

## 2016-03-10 NOTE — ED Provider Notes (Signed)
[place] DEPT Provider Note   CSN: [id number] Arrival date & time: 03/10/16  [id number]  First Provider Contact:  First MD Initiated Contact with Patient 03/10/16 [phone]        History   Chief Complaint Chief Complaint  Patient presents with  . Urinary Retention    HPI Alexander Wolf is a 60 y.o. male.  60-year-old male presents for urinary retention. The patient states he was seen here last week for the same. He was discharged with a Foley catheter. On Friday he was told to remove the Foley catheter. Since that time he has not been able to fully void. He said he has had some dribbling urine but never any stream. He was seen at an urgent care today where they were unable to place a catheter. They directed him to the emergency department. He reports discomfort in his abdomen. No fevers or chills.      Past Medical History:  Diagnosis Date  . Cancer [facility])    Bladder cancer; [facility] in [place], [place];   . Coronary artery disease    s/p CABG  . Diabetes mellitus without complication ([place])   . GERD (gastroesophageal reflux disease)   . Hyperlipidemia   . Hypertension   . Insomnia     Patient Active Problem List   Diagnosis Date Noted  . Coronary artery disease involving coronary bypass graft of native heart without angina pectoris 04/04/2014  . Essential hypertension 04/04/2014  . Hyperlipidemia LDL goal <70 04/04/2014  . Type 2 diabetes mellitus not at goal [facility]) 04/04/2014  . Hx of bladder cancer 04/04/2014    Past Surgical History:  Procedure Laterality Date  . BLADDER SURGERY    . CORONARY ARTERY BYPASS GRAFT    . tonsillectomy         Home Medications    Prior to Admission medications   Medication Sig Start Date End Date Taking? Authorizing Provider  aspirin 325 MG tablet Take 325 mg by mouth daily.   Yes Historical Provider, MD  atorvastatin (LIPITOR) 40 MG tablet Take 1 tablet (40 mg total) by mouth daily. 08/28/15  Yes Wardell Honour, MD  ciprofloxacin  (CIPRO) 500 MG tablet Take 1 tablet (500 mg total) by mouth 2 (two) times daily. 03/05/16  Yes Mancel Bale, PA-C  glipiZIDE (GLUCOTROL) 5 MG tablet Take 1 tablet (5 mg total) by mouth 2 (two) times daily before a meal. 05/27/15  Yes Wardell Honour, MD  HYDROcodone-acetaminophen (NORCO/VICODIN) 5-325 MG tablet Take 1 tablet by mouth once.   Yes Historical Provider, MD  lisinopril (PRINIVIL,ZESTRIL) 20 MG tablet Take 1 tablet (20 mg total) by mouth daily. 05/27/15  Yes Wardell Honour, MD  metFORMIN (GLUCOPHAGE) 1000 MG tablet Take 1 tablet (1,000 mg total) by mouth 2 (two) times daily with a meal. 05/27/15  Yes Wardell Honour, MD  metoprolol (LOPRESSOR) 50 MG tablet Take 1/2 tab PO BID Patient taking differently: Take 25 mg by mouth 2 (two) times daily.  05/27/15  Yes Wardell Honour, MD  Omega-3 Fatty Acids (FISH OIL) 300 MG CAPS Take 600 capsules by mouth 2 (two) times daily.    Yes Historical Provider, MD  omeprazole (PRILOSEC) 40 MG capsule TAKE 1 CAPSULE BY MOUTH EVERY DAY Patient taking differently: Take 40 mg by mouth daily. TAKE 05/27/15  Yes Wardell Honour, MD  tamsulosin (FLOMAX) 0.4 MG CAPS capsule Take 1 capsule (0.4 mg total) by mouth daily. 03/05/16  Yes Mancel Bale, PA-C  zolpidem (AMBIEN) 10 MG tablet Take 1 tablet (10 mg total) by mouth at bedtime as needed. for sleep Patient taking differently: Take 10 mg by mouth at bedtime as needed for sleep. for sleep 09/09/15  Yes Wardell Honour, MD  oxyCODONE-acetaminophen (ROXICET) 5-325 MG tablet Take 1-2 tablets by mouth every 4 (four) hours as needed for severe pain. 03/10/16   Shawnee Knapp, MD    Family History Family History  Problem Relation Age of Onset  . Varicose Veins Mother     Social History Social History  Substance Use Topics  . Smoking status: Never Smoker  . Smokeless tobacco: Never Used  . Alcohol use No     Allergies   Review of patient's allergies indicates no known allergies.   Review of Systems Review of  Systems  Constitutional: Negative for chills, fatigue and fever.  HENT: Negative for congestion, rhinorrhea and sinus pressure.   Eyes: Negative for visual disturbance.  Respiratory: Negative for cough, chest tightness and shortness of breath.   Cardiovascular: Negative for chest pain and palpitations.  Gastrointestinal: Positive for abdominal distention and abdominal pain. Negative for blood in stool, constipation, diarrhea, nausea and vomiting.  Genitourinary: Positive for difficulty urinating, dysuria and penile pain (at the tip). Negative for discharge, penile swelling, scrotal swelling, testicular pain and urgency.  Musculoskeletal: Negative for back pain and myalgias.  Skin: Negative for rash.  Neurological: Negative for dizziness, weakness, light-headedness and numbness.  Hematological: Does not bruise/bleed easily.     Physical Exam Updated Vital Signs BP 142/71 (BP Location: Right Arm)   Pulse 76   Temp 98.1 F (36.7 C) (Oral)   Resp 15   SpO2 97%   Physical Exam  Constitutional: He is oriented to person, place, and time. He appears well-developed and well-nourished. No distress.  HENT:  Head: Normocephalic and atraumatic.  Right Ear: External ear normal.  Left Ear: External ear normal.  Mouth/Throat: Oropharynx is clear and moist. No oropharyngeal exudate.  Eyes: EOM are normal. Pupils are equal, round, and reactive to light.  Neck: Normal range of motion. Neck supple.  Cardiovascular: Normal rate, regular rhythm, normal heart sounds and intact distal pulses.   No murmur heard. Pulmonary/Chest: Effort normal. No respiratory distress. He has no wheezes. He has no rales.  Abdominal: Soft. He exhibits no distension. There is tenderness (mild tenderness and mild distension) in the suprapubic area.  Genitourinary: Testes normal.  Genitourinary Comments: Patient with some bleeding at the penile meatus  Musculoskeletal: He exhibits no edema.  Neurological: He is alert and  oriented to person, place, and time.  Skin: Skin is warm and dry. No rash noted. He is not diaphoretic.  Vitals reviewed.    ED Treatments / Results  Labs (all labs ordered are listed, but only abnormal results are displayed) Labs Reviewed  URINALYSIS, ROUTINE W REFLEX MICROSCOPIC (NOT AT [facility]) - Abnormal; Notable for the following:       Result Value   APPearance HAZY (*)    Glucose, UA 100 (*)    Hgb urine dipstick LARGE (*)    Protein, ur 100 (*)    All other components within normal limits  URINE MICROSCOPIC-ADD ON - Abnormal; Notable for the following:    Squamous Epithelial / LPF 0-5 (*)    Bacteria, UA RARE (*)    All other components within normal limits    EKG  EKG Interpretation None       Radiology No results found.  Procedures Procedures (  including critical care time)  Medications Ordered in ED Medications  lidocaine (XYLOCAINE) 2 % jelly 1 application (not administered)  diazepam (VALIUM) tablet 5 mg (5 mg Oral Given 03/10/16 1022)  oxyCODONE-acetaminophen (PERCOCET/ROXICET) 5-325 MG per tablet 1 tablet (1 tablet Oral Given 03/10/16 1022)     Initial Impression / Assessment and Plan / ED Course  I have reviewed the triage vital signs and the nursing notes.  Pertinent labs & imaging results that were available during my care of the patient were reviewed by me and considered in my medical decision making (see chart for details).  Clinical Course   Patient seen and evaluated in stable condition. Patient with urinary retention. Over 1000 mL of urine in his bladder. Foley catheter placed at bedside. He was discharged home in stable condition with instruction of follow-up with urology. Urine without sign of infection. Patient expressed understanding and agreement with plan for discharge and outpatient follow-up.  Final Clinical Impressions(s) / ED Diagnoses   Final diagnoses:  Urinary retention  Foley catheter in place    New Prescriptions New  Prescriptions   No medications on file     Harvel Quale, MD 03/10/16 1652

## 2016-03-10 NOTE — Patient Instructions (Signed)
     IF you received an x-ray today, you will receive an invoice from [facility]. Please contact [facility] at [phone] with questions or concerns regarding your invoice.   IF you received labwork today, you will receive an invoice from [facility]. Please contact [facility] at [phone] with questions or concerns regarding your invoice.   Our billing staff will not be able to assist you with questions regarding bills from these companies.  You will be contacted with the lab results as soon as they are available. The fastest way to get your results is to activate your My Chart account. Instructions are located on the last page of this paperwork. If you have not heard from us regarding the results in 2 weeks, please contact this office.      

## 2016-03-10 NOTE — ED Notes (Signed)
Bed: WA03 Expected date:  Expected time:  Means of arrival:  Comments: From UC- urinary retention

## 2016-03-10 NOTE — Progress Notes (Signed)
Subjective:    Patient ID: Alexander Wolf, male    DOB: March 11, 1956, 60 y.o.   MRN: [id number] By signing my name below, I, Alexander Wolf, attest that this documentation has been prepared under the direction and in the presence of Alexander Cheadle, MD. Electronically Signed: Judithe Wolf, ER Scribe. 03/10/2016. 8:42 AM.  Chief Complaint  Patient presents with  . Urinary Tract Infection    several days, frequent urination    Urinary Tract Infection   Associated symptoms include frequency, hematuria, nausea and urgency. Pertinent negatives include no chills, flank pain or vomiting.   HPI Comments: Alexander Wolf is a 60 y.o. male who presents to [facility] complaining of urinary retention. He had a urinary catheter placed in the ER four days ago. He removed his urinary catheter yesterday at 6pm. He has felt his urine start to build up since that time. The he has been able to pass is clear. He has been taking flomax without any relief. He denies nausea or vomiting. He has not been called by urology. His catheter was left in when he left the ER. He took his catheter out yesterday at 5pm. He has not been checking his blood sugar. He has had some intermittent diarrhea.   Seen five days ago in the at our office by Alexander Wolf with five days of urinary sx. He has a past hx of bladder cancer diagnosed five years ago by [facility]. He has not had urology follow up since then, but would be willing to be followed by [facility] urology. Five days prior he developed frequency and difficulty urinating, as well as dysuria. His UA was consistent with a UTI with pos nitrites, mod blood, and a leucocytosis of 12.1. He was started on Cipro 500 BID on flomax and referred to urology. The following day his sx worsened and he dev acute urinary retention. He went to ER and had a urinary catheter placed and 1400 ML of urine was removed. He was not sent home with any medications or additional plan of management. The urine we colleceted here  was not sent for culture, as was ordered. The day following the urine from the ER was cultured but he had already been on abx so it was negative.   Past Medical History:  Diagnosis Date  . Cancer [facility])    Bladder cancer; [facility] in [place], [place];   . Coronary artery disease    s/p CABG  . Diabetes mellitus without complication ([place])   . GERD (gastroesophageal reflux disease)   . Hyperlipidemia   . Hypertension   . Insomnia    No Known Allergies  Current Outpatient Prescriptions on File Prior to Visit  Medication Sig Dispense Refill  . aspirin 325 MG tablet Take 325 mg by mouth daily.    Marland Kitchen atorvastatin (LIPITOR) 40 MG tablet Take 1 tablet (40 mg total) by mouth daily. 90 tablet 3  . ciprofloxacin (CIPRO) 500 MG tablet Take 1 tablet (500 mg total) by mouth 2 (two) times daily. 20 tablet 0  . glipiZIDE (GLUCOTROL) 5 MG tablet Take 1 tablet (5 mg total) by mouth 2 (two) times daily before a meal. 180 tablet 3  . lisinopril (PRINIVIL,ZESTRIL) 20 MG tablet Take 1 tablet (20 mg total) by mouth daily. 90 tablet 1  . metFORMIN (GLUCOPHAGE) 1000 MG tablet Take 1 tablet (1,000 mg total) by mouth 2 (two) times daily with a meal. 180 tablet 1  . metoprolol (LOPRESSOR) 50 MG tablet Take 1/2  tab PO BID 90 tablet 1  . Omega-3 Fatty Acids (FISH OIL) 300 MG CAPS Take 600 capsules by mouth.    Marland Kitchen omeprazole (PRILOSEC) 40 MG capsule TAKE 1 CAPSULE BY MOUTH EVERY DAY 90 capsule 3  . tamsulosin (FLOMAX) 0.4 MG CAPS capsule Take 1 capsule (0.4 mg total) by mouth daily. 30 capsule 0  . zolpidem (AMBIEN) 10 MG tablet Take 1 tablet (10 mg total) by mouth at bedtime as needed. for sleep 30 tablet 5   No current facility-administered medications on file prior to visit.     Review of Systems  Constitutional: Positive for activity change, appetite change, diaphoresis and fatigue. Negative for chills and fever.  Gastrointestinal: Positive for abdominal distention, diarrhea and nausea. Negative for abdominal pain,  anal bleeding, blood in stool, constipation, rectal pain and vomiting.  Genitourinary: Positive for decreased urine volume, difficulty urinating, enuresis, frequency, hematuria and urgency. Negative for discharge, dysuria, flank pain, genital sores, penile pain, penile swelling, scrotal swelling and testicular pain.  Skin: Negative for color change and pallor.  Neurological: Negative for dizziness and syncope.  Hematological: Negative for adenopathy.  Psychiatric/Behavioral: Positive for sleep disturbance.      Objective:   Physical Exam  Constitutional: He is oriented to person, place, and time. He appears well-developed and well-nourished. No distress.  Skin is clammy, diaphoretic.  HENT:  Head: Normocephalic and atraumatic.  Eyes: Pupils are equal, round, and reactive to light.  Neck: Neck supple.  Normal thyroid.  Cardiovascular: Normal rate, regular rhythm and normal heart sounds.   Pulmonary/Chest: Effort normal and breath sounds normal. No respiratory distress. He has no wheezes.  Abdominal:  Hypoactive tympanitic bowel sounds. Abdomen with moderate distension. TTP supra pubic region with guarding.  Musculoskeletal: Normal range of motion.  Neurological: He is alert and oriented to person, place, and time. Coordination normal.  Skin: Skin is warm and dry. He is not diaphoretic.  Psychiatric: He has a normal mood and affect. His behavior is normal.  Nursing note and vitals reviewed.  BP (!) 152/80   Pulse 93   Temp 98.1 F (36.7 C) (Oral)   Resp 18   Ht 5\' 10"  (1.778 m)   Wt 242 lb 9.6 oz (110 kg)   SpO2 96%   BMI 34.81 kg/m     Verbal informed consent obtained.  Sterile procedure followed. Glans cleansed with sterile towelettes x 3, followed by betadine x 3.  Introduced 14 in [place] foley catheter into bladder without resistance, no urine drained. Removed, tip cleansed with sterile water to ensure it was not blocked, introduced again without urinary return.  Called in  second provider who attempted with same results.  Changed catheter to an in/out red rubber. Again, introduced without difficulty and advanced all the way to hub without resistance though pt had sig pain, again no urinary return even with valsalva and palpation of bladder. Assessment & Plan:   1. Frequent urination   2. Acute urinary retention   3. Abdominal distension   Catheter tried mult times w/o difficulty and introduced quite far but despite different manuvers and change in position as well as trying different catheters and providers, no urine was able to be drained. Instructed pt and his wife to go straight to [facility]. Charge nurse informed of pt's impending arrival by private vehicle and that he is in extreme discomfort and distress. Will need catheter placed under US guidance there asap.  No blood was collected. Pt is to cont on  cipro and flomax. Ok to start prn oxycodone (will not need to worry about urinary retention from pain meds as will have catheter in. Does not yet have urology appt sched - he has not been contacted by [facility] - will call on Monday to ensure he can be seen this wk. If [facility] cannot see him this wk, he is willing to go anywhere else other than [facility]. Needs abd/pelvic CT scan  Orders Placed This Encounter  Procedures  . POCT urinalysis dipstick  . POCT Microscopic Urinalysis ([facility])    Meds ordered this encounter  Medications  . oxyCODONE-acetaminophen (ROXICET) 5-325 MG tablet    Sig: Take 1-2 tablets by mouth every 4 (four) hours as needed for severe pain.    Dispense:  30 tablet    Refill:  0    I personally performed the services described in this documentation, which was scribed in my presence. The recorded information has been reviewed and considered, and addended by me as needed.   Alexander Wolf, M.D.  [facility] [address] [place], [place] [address] [phone] phone [phone] fax  03/10/16  9:25 AM

## 2016-03-10 NOTE — Discharge Instructions (Signed)
You were seen and evaluated today for your inability to urinate. Please keep your Foley catheter in place. Please follow-up outpatient with the urologist.

## 2016-03-10 NOTE — ED Triage Notes (Signed)
Pt from home with c/o urinary retention, was seen at [facility] earlier today where they attempted to insert foley cath. without success. Pt reports he was seen here on Tuesday and had a catheter placed and was told to remove it at the endo of the week. Yesterday pt's wife removed catheter and pt hasn't been able to urinate since (only dripping). Bladder scanner shows over 933ml.

## 2016-03-15 ENCOUNTER — Other Ambulatory Visit: Payer: Self-pay | Admitting: Urology

## 2016-03-22 ENCOUNTER — Encounter (HOSPITAL_BASED_OUTPATIENT_CLINIC_OR_DEPARTMENT_OTHER): Payer: Self-pay | Admitting: *Deleted

## 2016-03-22 NOTE — Progress Notes (Signed)
NPO AFTER MN.  ARRIVE AT [id number].  CURRENT LAB RESULTS AND EKG IN CHART AND EPIC.  WILL TAKE PRILOSEC AND METOPROLOL AM DOS W/ SIPS OF WATER.

## 2016-03-28 ENCOUNTER — Other Ambulatory Visit: Payer: Self-pay | Admitting: Family Medicine

## 2016-03-28 DIAGNOSIS — I25810 Atherosclerosis of coronary artery bypass graft(s) without angina pectoris: Secondary | ICD-10-CM

## 2016-03-28 DIAGNOSIS — I10 Essential (primary) hypertension: Secondary | ICD-10-CM

## 2016-03-28 NOTE — Telephone Encounter (Signed)
Patient wants the zolipdem refill today because he's having surgery tomorrow 8/17. Patient states he doesn't know if he'll get a chance to pick up the medication after surgery. Patient also wants refills for omeprazole and atorvastatin. Patient was not aware if there being a 24-72 hour turn around time. Please advise!

## 2016-03-29 ENCOUNTER — Encounter (HOSPITAL_COMMUNITY)
Admission: AD | Disposition: A | Discharge status: Home / Self Care | Payer: Self-pay | Source: Ambulatory Visit | Attending: Urology

## 2016-03-29 ENCOUNTER — Ambulatory Visit (HOSPITAL_BASED_OUTPATIENT_CLINIC_OR_DEPARTMENT_OTHER): Payer: 59 | Admitting: Anesthesiology

## 2016-03-29 ENCOUNTER — Encounter (HOSPITAL_BASED_OUTPATIENT_CLINIC_OR_DEPARTMENT_OTHER): Payer: Self-pay

## 2016-03-29 ENCOUNTER — Observation Stay (HOSPITAL_BASED_OUTPATIENT_CLINIC_OR_DEPARTMENT_OTHER)
Admission: AD | Admit: 2016-03-29 | Discharge: 2016-03-30 | Disposition: A | Discharge status: Home / Self Care | Payer: 59 | Source: Ambulatory Visit | Attending: Urology | Admitting: Urology

## 2016-03-29 DIAGNOSIS — C61 Malignant neoplasm of prostate: Secondary | ICD-10-CM | POA: Diagnosis not present

## 2016-03-29 DIAGNOSIS — E785 Hyperlipidemia, unspecified: Secondary | ICD-10-CM | POA: Insufficient documentation

## 2016-03-29 DIAGNOSIS — I10 Essential (primary) hypertension: Secondary | ICD-10-CM | POA: Diagnosis not present

## 2016-03-29 DIAGNOSIS — K219 Gastro-esophageal reflux disease without esophagitis: Secondary | ICD-10-CM | POA: Insufficient documentation

## 2016-03-29 DIAGNOSIS — N403 Nodular prostate with lower urinary tract symptoms: Secondary | ICD-10-CM | POA: Insufficient documentation

## 2016-03-29 DIAGNOSIS — Z7984 Long term (current) use of oral hypoglycemic drugs: Secondary | ICD-10-CM | POA: Diagnosis not present

## 2016-03-29 DIAGNOSIS — Z87891 Personal history of nicotine dependence: Secondary | ICD-10-CM | POA: Insufficient documentation

## 2016-03-29 DIAGNOSIS — Z79899 Other long term (current) drug therapy: Secondary | ICD-10-CM | POA: Insufficient documentation

## 2016-03-29 DIAGNOSIS — N320 Bladder-neck obstruction: Secondary | ICD-10-CM | POA: Diagnosis present

## 2016-03-29 DIAGNOSIS — E1165 Type 2 diabetes mellitus with hyperglycemia: Secondary | ICD-10-CM | POA: Insufficient documentation

## 2016-03-29 DIAGNOSIS — N401 Enlarged prostate with lower urinary tract symptoms: Secondary | ICD-10-CM | POA: Insufficient documentation

## 2016-03-29 DIAGNOSIS — G4700 Insomnia, unspecified: Secondary | ICD-10-CM | POA: Diagnosis not present

## 2016-03-29 DIAGNOSIS — Z6833 Body mass index (BMI) 33.0-33.9, adult: Secondary | ICD-10-CM | POA: Diagnosis not present

## 2016-03-29 DIAGNOSIS — N138 Other obstructive and reflux uropathy: Secondary | ICD-10-CM | POA: Diagnosis not present

## 2016-03-29 DIAGNOSIS — C675 Malignant neoplasm of bladder neck: Principal | ICD-10-CM | POA: Insufficient documentation

## 2016-03-29 DIAGNOSIS — R338 Other retention of urine: Secondary | ICD-10-CM | POA: Insufficient documentation

## 2016-03-29 DIAGNOSIS — I2510 Atherosclerotic heart disease of native coronary artery without angina pectoris: Secondary | ICD-10-CM | POA: Diagnosis not present

## 2016-03-29 DIAGNOSIS — Z951 Presence of aortocoronary bypass graft: Secondary | ICD-10-CM | POA: Diagnosis not present

## 2016-03-29 HISTORY — PX: CYSTOSCOPY: SHX5120

## 2016-03-29 HISTORY — DX: Benign prostatic hyperplasia with lower urinary tract symptoms: N40.1

## 2016-03-29 HISTORY — DX: Presence of aortocoronary bypass graft: Z95.1

## 2016-03-29 HISTORY — PX: TRANSURETHRAL RESECTION OF BLADDER TUMOR WITH GYRUS (TURBT-GYRUS): SHX6458

## 2016-03-29 HISTORY — DX: Presence of other specified devices: Z97.8

## 2016-03-29 HISTORY — DX: Other retention of urine: R33.8

## 2016-03-29 HISTORY — PX: TRANSURETHRAL RESECTION OF PROSTATE: SHX73

## 2016-03-29 HISTORY — DX: Type 2 diabetes mellitus without complications: E11.9

## 2016-03-29 HISTORY — DX: Malignant neoplasm of bladder, unspecified: C67.9

## 2016-03-29 HISTORY — DX: Presence of urogenital implants: Z96.0

## 2016-03-29 LAB — POCT I-STAT 4, (NA,K, GLUC, HGB,HCT)
Glucose, Bld: 313 mg/dL — ABNORMAL HIGH (ref 65–99)
HCT: 48 % (ref 39.0–52.0)
Hemoglobin: 16.3 g/dL (ref 13.0–17.0)
Potassium: 4.5 mmol/L (ref 3.5–5.1)
SODIUM: 136 mmol/L (ref 135–145)

## 2016-03-29 LAB — GLUCOSE, CAPILLARY
GLUCOSE-CAPILLARY: 279 mg/dL — AB (ref 65–99)
GLUCOSE-CAPILLARY: 293 mg/dL — AB (ref 65–99)
GLUCOSE-CAPILLARY: 275 mg/dL — AB (ref 65–99)
Glucose-Capillary: 302 mg/dL — ABNORMAL HIGH (ref 65–99)
Glucose-Capillary: 350 mg/dL — ABNORMAL HIGH (ref 65–99)

## 2016-03-29 SURGERY — TURP (TRANSURETHRAL RESECTION OF PROSTATE)
Anesthesia: General

## 2016-03-29 MED ORDER — HYDROMORPHONE HCL 1 MG/ML IJ SOLN
0.2500 mg | INTRAMUSCULAR | Status: DC | PRN
  Administered 2016-03-29 (×4): 0.5 mg via INTRAVENOUS
  Filled 2016-03-29: qty 1

## 2016-03-29 MED ORDER — LIDOCAINE HCL (CARDIAC) 20 MG/ML IV SOLN
INTRAVENOUS | Status: DC | PRN
  Administered 2016-03-29: 100 mg via INTRAVENOUS

## 2016-03-29 MED ORDER — CEFAZOLIN SODIUM-DEXTROSE 2-4 GM/100ML-% IV SOLN
INTRAVENOUS | Status: AC
  Filled 2016-03-29: qty 100

## 2016-03-29 MED ORDER — LACTATED RINGERS IV SOLN
INTRAVENOUS | Status: DC
  Administered 2016-03-29 (×2): via INTRAVENOUS
  Filled 2016-03-29: qty 1000

## 2016-03-29 MED ORDER — ATORVASTATIN CALCIUM 40 MG PO TABS
40.0000 mg | ORAL_TABLET | Freq: Every morning | ORAL | Status: DC
  Administered 2016-03-29: 40 mg via ORAL
  Filled 2016-03-29 (×2): qty 1

## 2016-03-29 MED ORDER — OXYCODONE HCL 5 MG PO TABS
5.0000 mg | ORAL_TABLET | ORAL | Status: DC | PRN
  Administered 2016-03-29 – 2016-03-30 (×3): 5 mg via ORAL
  Filled 2016-03-29 (×3): qty 1

## 2016-03-29 MED ORDER — MEPERIDINE HCL 25 MG/ML IJ SOLN
6.2500 mg | INTRAMUSCULAR | Status: DC | PRN
Start: 2016-03-29 — End: 2016-03-29
  Filled 2016-03-29: qty 1

## 2016-03-29 MED ORDER — ACETAMINOPHEN 325 MG PO TABS
650.0000 mg | ORAL_TABLET | ORAL | Status: DC | PRN
  Filled 2016-03-29: qty 2

## 2016-03-29 MED ORDER — SODIUM CHLORIDE 0.9 % IR SOLN
Status: DC | PRN
  Administered 2016-03-29: 3000 mL
  Administered 2016-03-29: 3000 mL via INTRAVESICAL
  Administered 2016-03-29: 500 mL
  Administered 2016-03-29 (×3): 3000 mL via INTRAVESICAL
  Administered 2016-03-29: 500 mL
  Administered 2016-03-29: 1000 mL
  Administered 2016-03-29: 500 mL

## 2016-03-29 MED ORDER — SODIUM CHLORIDE 0.45 % IV SOLN
INTRAVENOUS | Status: DC
  Administered 2016-03-29: 13:00:00 via INTRAVENOUS

## 2016-03-29 MED ORDER — HYDROMORPHONE HCL 1 MG/ML IJ SOLN
INTRAMUSCULAR | Status: AC
  Filled 2016-03-29: qty 1

## 2016-03-29 MED ORDER — SULFAMETHOXAZOLE-TRIMETHOPRIM 800-160 MG PO TABS
1.0000 | ORAL_TABLET | Freq: Two times a day (BID) | ORAL | Status: DC
  Administered 2016-03-29 – 2016-03-30 (×3): 1 via ORAL
  Filled 2016-03-29 (×3): qty 1

## 2016-03-29 MED ORDER — PROPOFOL 10 MG/ML IV BOLUS
INTRAVENOUS | Status: DC | PRN
  Administered 2016-03-29: 80 mg via INTRAVENOUS
  Administered 2016-03-29: 20 mg via INTRAVENOUS
  Administered 2016-03-29: 200 mg via INTRAVENOUS

## 2016-03-29 MED ORDER — LIDOCAINE HCL (CARDIAC) 20 MG/ML IV SOLN
INTRAVENOUS | Status: AC
Start: 2016-03-29 — End: 2016-03-29
  Filled 2016-03-29: qty 5

## 2016-03-29 MED ORDER — FENTANYL CITRATE (PF) 100 MCG/2ML IJ SOLN
INTRAMUSCULAR | Status: AC
  Filled 2016-03-29: qty 2

## 2016-03-29 MED ORDER — ONDANSETRON HCL 4 MG/2ML IJ SOLN
4.0000 mg | Freq: Once | INTRAMUSCULAR | Status: DC | PRN
  Filled 2016-03-29: qty 2

## 2016-03-29 MED ORDER — ONDANSETRON HCL 4 MG/2ML IJ SOLN: 4.0000 mg | Freq: Once | INTRAMUSCULAR | 0 refills | Status: DC | PRN

## 2016-03-29 MED ORDER — FENTANYL CITRATE (PF) 100 MCG/2ML IJ SOLN
INTRAMUSCULAR | Status: DC | PRN
  Administered 2016-03-29: 50 ug via INTRAVENOUS
  Administered 2016-03-29 (×2): 25 ug via INTRAVENOUS
  Administered 2016-03-29: 50 ug via INTRAVENOUS
  Administered 2016-03-29 (×2): 25 ug via INTRAVENOUS

## 2016-03-29 MED ORDER — PROPOFOL 10 MG/ML IV BOLUS
INTRAVENOUS | Status: AC
  Filled 2016-03-29: qty 20

## 2016-03-29 MED ORDER — ZOLPIDEM TARTRATE 5 MG PO TABS
5.0000 mg | ORAL_TABLET | Freq: Every evening | ORAL | Status: DC | PRN
  Administered 2016-03-29: 5 mg via ORAL
  Filled 2016-03-29: qty 1

## 2016-03-29 MED ORDER — DOCUSATE SODIUM 100 MG PO CAPS
100.0000 mg | ORAL_CAPSULE | Freq: Two times a day (BID) | ORAL | Status: DC
  Administered 2016-03-29 – 2016-03-30 (×2): 100 mg via ORAL
  Filled 2016-03-29 (×2): qty 1

## 2016-03-29 MED ORDER — OXYCODONE HCL 5 MG PO TABS: 5.0000 mg | ORAL_TABLET | Freq: Once | ORAL | 0 refills | Status: DC | PRN

## 2016-03-29 MED ORDER — OMEPRAZOLE 40 MG PO CPDR: DELAYED_RELEASE_CAPSULE | ORAL | 1 refills | Status: AC

## 2016-03-29 MED ORDER — ONDANSETRON HCL 4 MG/2ML IJ SOLN
INTRAMUSCULAR | Status: DC | PRN
  Administered 2016-03-29: 4 mg via INTRAVENOUS

## 2016-03-29 MED ORDER — LISINOPRIL 20 MG PO TABS
20.0000 mg | ORAL_TABLET | Freq: Every morning | ORAL | Status: DC
  Administered 2016-03-30: 20 mg via ORAL
  Filled 2016-03-29: qty 1

## 2016-03-29 MED ORDER — MIDAZOLAM HCL 5 MG/5ML IJ SOLN
INTRAMUSCULAR | Status: DC | PRN
  Administered 2016-03-29: 2 mg via INTRAVENOUS

## 2016-03-29 MED ORDER — ATORVASTATIN CALCIUM 40 MG PO TABS: 40.0000 mg | ORAL_TABLET | Freq: Every day | ORAL | 1 refills | Status: DC

## 2016-03-29 MED ORDER — METOPROLOL TARTRATE 25 MG PO TABS
25.0000 mg | ORAL_TABLET | Freq: Two times a day (BID) | ORAL | Status: DC
  Administered 2016-03-29 – 2016-03-30 (×2): 25 mg via ORAL
  Filled 2016-03-29 (×2): qty 1

## 2016-03-29 MED ORDER — ONDANSETRON HCL 4 MG/2ML IJ SOLN
INTRAMUSCULAR | Status: AC
  Filled 2016-03-29: qty 2

## 2016-03-29 MED ORDER — OXYCODONE HCL 5 MG/5ML PO SOLN
5.0000 mg | Freq: Once | ORAL | Status: DC | PRN
  Filled 2016-03-29: qty 5

## 2016-03-29 MED ORDER — BELLADONNA ALKALOIDS-OPIUM 16.2-60 MG RE SUPP
RECTAL | Status: DC | PRN
  Administered 2016-03-29: 1 via RECTAL

## 2016-03-29 MED ORDER — INSULIN ASPART 100 UNIT/ML ~~LOC~~ SOLN
0.0000 [IU] | Freq: Three times a day (TID) | SUBCUTANEOUS | Status: DC
  Administered 2016-03-29: 11 [IU] via SUBCUTANEOUS
  Administered 2016-03-30: 5 [IU] via SUBCUTANEOUS

## 2016-03-29 MED ORDER — MIDAZOLAM HCL 2 MG/2ML IJ SOLN
INTRAMUSCULAR | Status: AC
  Filled 2016-03-29: qty 2

## 2016-03-29 MED ORDER — BELLADONNA ALKALOIDS-OPIUM 16.2-60 MG RE SUPP
1.0000 | Freq: Four times a day (QID) | RECTAL | Status: DC | PRN
  Filled 2016-03-29 (×2): qty 1

## 2016-03-29 MED ORDER — BELLADONNA ALKALOIDS-OPIUM 16.2-60 MG RE SUPP
RECTAL | Status: AC
  Filled 2016-03-29: qty 1

## 2016-03-29 MED ORDER — CEFAZOLIN IN D5W 1 GM/50ML IV SOLN
1.0000 g | INTRAVENOUS | Status: DC
  Filled 2016-03-29: qty 50

## 2016-03-29 MED ORDER — HYDROMORPHONE HCL 1 MG/ML IJ SOLN
0.5000 mg | INTRAMUSCULAR | Status: DC | PRN
  Administered 2016-03-29 (×4): 0.5 mg via INTRAVENOUS
  Filled 2016-03-29: qty 1

## 2016-03-29 MED ORDER — ONDANSETRON HCL 4 MG/2ML IJ SOLN
4.0000 mg | INTRAMUSCULAR | Status: DC | PRN
  Administered 2016-03-29: 4 mg via INTRAVENOUS
  Filled 2016-03-29: qty 2

## 2016-03-29 MED ORDER — OXYCODONE HCL 5 MG PO TABS
5.0000 mg | ORAL_TABLET | Freq: Once | ORAL | Status: DC | PRN
  Filled 2016-03-29: qty 1

## 2016-03-29 MED ORDER — CEFAZOLIN SODIUM-DEXTROSE 2-4 GM/100ML-% IV SOLN
2.0000 g | INTRAVENOUS | Status: AC
  Administered 2016-03-29: 2 g via INTRAVENOUS
  Filled 2016-03-29: qty 100

## 2016-03-29 MED ORDER — DEXAMETHASONE SODIUM PHOSPHATE 10 MG/ML IJ SOLN
INTRAMUSCULAR | Status: AC
  Filled 2016-03-29: qty 1

## 2016-03-29 SURGICAL SUPPLY — 23 items
BAG DRAIN URO-CYSTO SKYTR STRL (DRAIN) ×3 IMPLANT
BAG URINE DRAINAGE (UROLOGICAL SUPPLIES) ×3 IMPLANT
CATH FOLEY 3WAY 30CC 22F (CATHETERS) ×3 IMPLANT
CLOTH BEACON ORANGE TIMEOUT ST (SAFETY) ×3 IMPLANT
EVACUATOR MICROVAS BLADDER (UROLOGICAL SUPPLIES) ×3 IMPLANT
GLOVE BIO SURGEON STRL SZ8 (GLOVE) ×3 IMPLANT
GOWN STRL REUS W/ TWL LRG LVL3 (GOWN DISPOSABLE) ×1 IMPLANT
GOWN STRL REUS W/ TWL XL LVL3 (GOWN DISPOSABLE) ×1 IMPLANT
GOWN STRL REUS W/TWL LRG LVL3 (GOWN DISPOSABLE) ×2
GOWN STRL REUS W/TWL XL LVL3 (GOWN DISPOSABLE) ×2
GOWN XL W/COTTON TOWEL STD (GOWNS) ×3 IMPLANT
HOLDER FOLEY CATH W/STRAP (MISCELLANEOUS) ×3 IMPLANT
IV NS 1000ML (IV SOLUTION) ×2
IV NS 1000ML BAXH (IV SOLUTION) ×1 IMPLANT
IV NS IRRIG 3000ML ARTHROMATIC (IV SOLUTION) ×15 IMPLANT
KIT ROOM TURNOVER WOR (KITS) ×3 IMPLANT
LOOP CUT BIPOLAR 24F LRG (ELECTROSURGICAL) ×3 IMPLANT
MANIFOLD NEPTUNE II (INSTRUMENTS) ×3 IMPLANT
NEEDLE HYPO 22GX1.5 SAFETY (NEEDLE) IMPLANT
NS IRRIG 500ML POUR BTL (IV SOLUTION) ×9 IMPLANT
PACK CYSTO (CUSTOM PROCEDURE TRAY) ×3 IMPLANT
TUBE CONNECTING 12'X1/4 (SUCTIONS) ×1
TUBE CONNECTING 12X1/4 (SUCTIONS) ×2 IMPLANT

## 2016-03-29 NOTE — Transfer of Care (Signed)
Immediate Anesthesia Transfer of Care Note  Patient: Alexander Wolf  Procedure(s) Performed: Procedure(s) (LRB): TRANSURETHRAL RESECTION OF THE PROSTATE (TURP) (N/A) TRANSURETHRAL RESECTION OF BLADDER TUMOR WITH GYRUS (TURBT-GYRUS) (N/A) CYSTOSCOPY (N/A)  Patient Location: PACU  Anesthesia Type: General  Level of Consciousness: awake, sedated, patient cooperative and responds to stimulation  Airway & Oxygen Therapy: Patient Spontanous Breathing and Patient connected to [place] oxygen  Post-op Assessment: Report given to PACU RN, Post -op Vital signs reviewed and stable and Patient moving all extremities  Post vital signs: Reviewed and stable  Complications: No apparent anesthesia complications

## 2016-03-29 NOTE — H&P (Signed)
Urology History and Physical Exam  CC: Urinary retention/bladder cancer  HPI:  60 year old male presents for cystoscopy, TURBT as well as TURP for a bladder tumor seen on CT scan as well as obstruction from possible bladder neck involvement of his bladder cancer vs.obstructive prostate. He presented recently follwing ER visit for urinary retention. Followup in the office revealed an obstructing prostate/BN, and cystoscopic examination of his bladder was impossible due to obstruction and pain. CT revealed a 1-2 cm lesion in BN area, no evidence of extravesical disease. His history dates back approximately 3 years when he underwent TURBT by Dr Nevada Crane in [place]. He had NMIBC, high grade. He did not followup as recommended and has not seen a urologist until his recent presentation.  PMH: Past Medical History:  Diagnosis Date  . Bladder cancer ([place])    [facility] in [place], [place]; /  urologist in [place]-  dr Christella App  . BPH (benign prostatic hypertrophy) with urinary retention   . Coronary artery disease    s/p CABG  (per pt hasn't seen cardiologist for several years)  . Foley catheter in place   . GERD (gastroesophageal reflux disease)   . Hyperlipidemia   . Hypertension   . Insomnia   . 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 Type 2 diabetes mellitus ([facility])     PSH: Past Surgical History:  Procedure Laterality Date  . CARDIAC CATHETERIZATION  10/26/2003  dr Leonia Reeves   ETT + ischemia/  severe 3 vessel avcad,  normal LVF, ef 55-60%  . CORONARY ARTERY BYPASS GRAFT  10-28-2003   dr Ricard Dillon   LIMA to dLAD,  RIMA to OM1,  SVG to OM2,  SVG to  right posterolateral   . TONSILLECTOMY  as child  . TRANSURETHRAL RESECTION OF BLADDER TUMOR  2014    Allergies: No Known Allergies  Medications: No prescriptions prior to admission.     Social History: Social History   Social History  . Marital status: Married    Spouse name: N/A  . Number of children: 3  . Years of education: N/A   Occupational  History  . welder    Social History Main Topics  . Smoking status: Former Smoker    Packs/day: 1.00    Years: 44.00    Types: Cigarettes    Quit date: 03/22/2012  . Smokeless tobacco: Never Used  . Alcohol use No  . Drug use: No  . Sexual activity: Yes   Other Topics Concern  . Not on file   Social History Narrative   Marital status: married x 12 years; second marriage.      Children: 3 children; 3 grandchildren; no gg      Lives: with wife      Employment:  Building control surveyor; Architect; 40 hours per week      Tobacco: quit in 3 years ago in 2013; smoked 45 years      Alcohol:  None      Drugs:  None      Exercise: works hard every day      Seatbelt:  100%; no texting while driving         Family History: Family History  Problem Relation Age of Onset  . Varicose Veins Mother     Review of Systems: Positive: Recent severe LUTS, improved w/ catheter drainage. Negative:   A further 10 point review of systems was negative except what is listed in the HPI.  Physical Exam: @VITALS2 @ General: No acute distress.  Awake. Head:  Normocephalic.  Atraumatic. ENT:  EOMI.  Mucous membranes moist Neck:  Supple.  No lymphadenopathy. CV:  S1 present. S2 present. Regular rate. Pulmonary: Equal effort bilaterally.  Clear to auscultation bilaterally. Abdomen: Soft.  Non tender to palpation. Skin:  Normal turgor.  No visible rash. Extremity: No gross deformity of bilateral upper extremities.  No gross deformity of                             lower extremities. Neurologic: Alert. Appropriate mood.   Studies:  No results for input(s): HGB, WBC, PLT in the last 72 hours.  No results for input(s): NA, K, CL, CO2, BUN, CREATININE, CALCIUM, GFRNONAA, GFRAA in the last 72 hours.  Invalid input(s): MAGNESIUM   No results for input(s): INR, APTT in the last 72 hours.  Invalid input(s): PT   Invalid input(s): ABG    Assessment: BOO w/ urinary retention. 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    Plan: TURP, TURBT

## 2016-03-29 NOTE — Op Note (Signed)
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 Jenee Spaugh, M.D.   Anesthesia: Gen.   Complications: None  Specimen(s): 1. Bladder tumor 2. Bladder tumor base 3. TURP chips 4. Left bladder wall biopsy  Drain(s): 30 French three-way Foley catheter to CBI  Indications: 60 year old male who recently presented with bladder outlet obstruction/urinary retention. The patient has a history significant for high-grade non-muscle invasive bladder cancer, resected in late 2014, with repeat resection one month later by Dr. Jonette Eva in [place], [place]. It was recommended that the patient have immune therapy with BCG. The patient never followed up as requested, and presented recently to my office, having had a catheter placed in the emergency room for bladder outlet obstruction. Catheterization was quite difficult in this man. At his initial visit, I had very little data on the patient, just his verbal history of having a bladder tumor resected years before, and his emergency room visit with difficult catheterization. I attempted cystoscopy, which was very painful for the patient and I could not guide the cystoscope passed his obstructive prostate.  Since that time, I have received data from Dr. Nevada Crane. The patient had a 5-7 cm left bladder wall cancer, non-muscle invasive, high-grade. Dr. Nevada Crane requested that the patient undergo immunotherapy, but the patient has seemingly been in denial, and did not follow up appropriately. He underwent hematuria CT revealing a 13  mm anterior bladder wall tumor, no hydronephrosis, no evidence of extravesical disease. The patient presents at this time for probable TURP, resection of his bladder tumor recurrence. Risks and complications of the procedure have been discussed with the patient and his wife. They are aware of possible prostatic involvement with this bladder cancer. They desire to proceed.  Findings: 13 mm papillary bladder tumor anteriorly in the bladder neck as expected. It is just to the right of the midline. There is posterior bladder wall erythema consistent with presence of a Foley catheter. Area of initial tumor on the left bladder wall was pale, adjacent to this was the slightly raised urothelium from what I thought was Foley catheter trauma. There was no indication in this area, other than the raised urothelium of bladder cancer recurrence. The prostatic fossa revealed lobular growth of what I thought was more than likely recurrent/progressive urothelial carcinoma. This was present in the area of the verumontanum more so than at the base of the prostate. There was an obstructive prostate. Palpably, the prostate was significantly nodular, with the left lobe more nodular than the right.  Description of procedure: The patient was properly identified in the holding area. I discussed the procedure once again with the wife and the patient. He was then taken to the operating room. Preoperative IV antibiotics were administered. He was administered general anesthesia with the LMA. He was placed in the dorsolithotomy position. Genitalia and perineum were prepped and draped. Proper timeout was performed.  The 33 French resectoscope sheath was passed with the visual obturator. The anterior urethra was normal. The prostatic urethra was significantly abnormal. Lobular growth of what I thought was more than likely recurrent urothelial carcinoma was present obscuring the verumontanum  and the distal prostatic urethra. At the base of  the prostate, there was obstruction more resembling BPH. The bladder was inspected circumferentially. Ureteral orifices were normal in configuration and location. The remainder of the bladder was inspected, described in the above findings.  The process to soak and the cutting loop were then placed. The right-sided anterior bladder wall tumor was then resected using the cutting current. This was then sent for pathology labeled "bladder tumor". The base of the bladder tumor was resected into the muscle, and was sent separately labeled "bladder tumor base". This area was then cauterized and hemostasis was adequate.  The left bladder wall, near the old resection performed by Dr. Nevada Crane was inspected. An area just adjacent to the scar, where the raised urothelium was present was then biopsied with the cutting loop. This was sent labeled "left bladder wall biopsy". This was then cauterized.  Attention was then turned to the obstructive prostate. Resection was begun at the bladder neck, circumferentially. The resected tissue was quite vascular. Resection was carried in a circumferential manner from the bladder neck distally toward the apex of the prostate. Because of the likelihood of this being invasive bladder cancer, I did not perform an aggressive resection, but resected enough tissue to provide for easy drainage of the bladder. The left lobe of the prostate was a bit more abundant than the right lobe. This was resected adequately. Attention was then turned to the apex of the prostate. This lobular tissue was carefully resected. I was again not aggressive in this area to decrease the risk of incontinence. Once there was an adequate prostatic fossa, the resected tissue was irrigated from the bladder. Inspection was carried out, and the resected tissue/prostatic bed was carefully electrocoagulated. No significant bleeding was seen at this point.  The bladder was inspected. No chips were left within the bladder.  Biopsied sites were all hemostatic. The scope was then removed. With the bladder full, there was an adequate flow of the irrigant out of the bladder with minimal suprapubic pressure. A 22 French Foley catheter, with three-way irrigation was placed. 30 mL of water was placed in the balloon. This was hooked to continuous bladder irrigation.  The patient tolerated the procedure well. He was awakened. He was then taken to the PACU in stable condition.

## 2016-03-29 NOTE — Anesthesia Procedure Notes (Signed)
Procedure Name: LMA Insertion Date/Time: 03/29/2016 8:44 AM Performed by: Justice Rocher Pre-anesthesia Checklist: Patient identified, Emergency Drugs available, Suction available and Patient being monitored Patient Re-evaluated:Patient Re-evaluated prior to inductionOxygen Delivery Method: Circle system utilized Preoxygenation: Pre-oxygenation with 100% oxygen Intubation Type: IV induction Ventilation: Mask ventilation without difficulty LMA: LMA inserted LMA Size: 4.0 Number of attempts: 1 Airway Equipment and Method: Bite block Placement Confirmation: positive ETCO2 Tube secured with: Tape Dental Injury: Teeth and Oropharynx as per pre-operative assessment

## 2016-03-29 NOTE — Anesthesia Preprocedure Evaluation (Signed)
Anesthesia Evaluation  Patient identified by MRN, date of birth, ID band Patient awake    Reviewed: Allergy & Precautions, NPO status , Patient's Chart, lab work & pertinent test results  Airway Mallampati: I  TM Distance: >3 FB Neck ROM: Full    Dental  (+) Teeth Intact, Dental Advisory Given   Pulmonary former smoker,    breath sounds clear to auscultation       Cardiovascular hypertension, Pt. on medications and Pt. on home beta blockers + CAD   Rhythm:Regular Rate:Normal     Neuro/Psych    GI/Hepatic GERD  Medicated and Controlled,  Endo/Other  diabetes, Poorly Controlled, Type 2, Oral Hypoglycemic AgentsMorbid obesity  Renal/GU      Musculoskeletal   Abdominal   Peds  Hematology   Anesthesia Other Findings   Reproductive/Obstetrics                             Anesthesia Physical Anesthesia Plan  ASA: III  Anesthesia Plan: General   Post-op Pain Management:    Induction: Intravenous  Airway Management Planned:   Additional Equipment:   Intra-op Plan:   Post-operative Plan: Extubation in OR  Informed Consent: I have reviewed the patients History and Physical, chart, labs and discussed the procedure including the risks, benefits and alternatives for the proposed anesthesia with the patient or authorized representative who has indicated his/her understanding and acceptance.   Dental advisory given  Plan Discussed with: CRNA, Anesthesiologist and Surgeon  Anesthesia Plan Comments:         Anesthesia Quick Evaluation

## 2016-03-29 NOTE — Anesthesia Postprocedure Evaluation (Signed)
Anesthesia Post Note  Patient: Alexander Wolf  Procedure(s) Performed: Procedure(s) (LRB): TRANSURETHRAL RESECTION OF THE PROSTATE (TURP) (N/A) TRANSURETHRAL RESECTION OF BLADDER TUMOR WITH GYRUS (TURBT-GYRUS) (N/A) CYSTOSCOPY (N/A)  Patient location during evaluation: PACU Anesthesia Type: General Level of consciousness: awake and alert Pain management: pain level controlled Vital Signs Assessment: post-procedure vital signs reviewed and stable Respiratory status: spontaneous breathing, nonlabored ventilation, respiratory function stable and patient connected to nasal cannula oxygen Cardiovascular status: blood pressure returned to baseline and stable Postop Assessment: no signs of nausea or vomiting Anesthetic complications: no    Last Vitals:  Vitals:   03/29/16 0658 03/29/16 0954  BP: 115/70 131/77  Pulse: 72 82  Resp: 16 13  Temp: 36.9 C     Last Pain:  Vitals:   03/29/16 0954  TempSrc:   PainSc: 10-Worst pain ever                 Halle Davlin A

## 2016-03-29 NOTE — Discharge Instructions (Signed)
Transurethral Resection of the Prostate ° °Care After ° °Refer to this sheet in the next few weeks. These discharge instructions provide you with general information on caring for yourself after you leave the hospital. Your caregiver may also give you specific instructions. Your treatment has been planned according to the most current medical practices available, but unavoidable complications sometimes occur. If you have any problems or questions after discharge, please call your caregiver. ° °HOME CARE INSTRUCTIONS  ° °Medications °· You may receive medicine for pain management. As your level of discomfort decreases, adjustments in your pain medicines may be made.  °· Take all medicines as directed.  °· You may be given a medicine (antibiotic) to kill germs following surgery. Finish all medicines. Let your caregiver know if you have any side effects or problems from the medicine.  °· If you are on aspirin, it would be best not to restart the aspirin until the blood in the urine clears °Hygiene °· You can take a shower after surgery.  °· You should not take a bath while you still have the urethral catheter. °Activity °· You will be encouraged to get out of bed as much as possible and increase your activity level as tolerated.  °· Spend the first week in and around your home. For 3 weeks, avoid the following:  °· Straining.  °· Running.  °· Strenuous work.  °· Walks longer than a few blocks.  °· Riding for extended periods.  °· Sexual relations.  °· Do not lift heavy objects (more than 20 pounds) for at least 1 month. When lifting, use your arms instead of your abdominal muscles.  °· You will be encouraged to walk as tolerated. Do not exert yourself. Increase your activity level slowly. Remember that it is important to keep moving after an operation of any type. This cuts down on the possibility of developing blood clots.  °· Your caregiver will tell you when you can resume driving and light housework. Discuss this  at your first office visit after discharge. °Diet °· No special diet is ordered after a TURP. However, if you are on a special diet for another medical problem, it should be continued.  °· Normal fluid intake is usually recommended.  °· Avoid alcohol and caffeinated drinks for 2 weeks. They irritate the bladder. Decaffeinated drinks are okay.  °· Avoid spicy foods.  °Bladder Function °· For the first 10 days, empty the bladder whenever you feel a definite desire. Do not try to hold the urine for long periods of time.  °· Urinating once or twice a night even after you are healed is not uncommon.  °· You may see some recurrence of blood in the urine after discharge from the hospital. This usually happens within 2 weeks after the procedure.If this occurs, force fluids again as you did in the hospital and reduce your activity.  °Bowel Function °· You may experience some constipation after surgery. This can be minimized by increasing fluids and fiber in your diet. Drink enough water and fluids to keep your urine clear or pale yellow.  °· A stool softener may be prescribed for use at home. Do not strain to move your bowels.  °· If you are requiring increased pain medicine, it is important that you take stool softeners to prevent constipation. This will help to promote proper healing by reducing the need to strain to move your bowels.  °Sexual Activity °· Semen movement in the opposite direction and into the bladder (  retrograde ejaculation) may occur. Since the semen passes into the bladder, cloudy urine can occur the first time you urinate after intercourse. Or, you may not have an ejaculation during erection. Ask your caregiver when you can resume sexual activity. Retrograde ejaculation and reduced semen discharge should not reduce one's pleasure of intercourse.  °Postoperative Visit °· Arrange the date and time of your after surgery visit with your caregiver.  °Return to Work °· After your recovery is complete, you will  be able to return to work and resume all activities. Your caregiver will inform you when you can return to work.  °Foley Catheter Care °A soft, flexible tube (Foley catheter) may have been placed in your bladder to drain urine and fluid. Follow these instructions: °Taking Care of the Catheter °· Keep the area where the catheter leaves your body clean.  °· Attach the catheter to the leg so there is no tension on the catheter.  °· Keep the drainage bag below the level of the bladder, but keep it OFF the floor.  °· Do not take long soaking baths. Your caregiver will give instructions about showering.  °· Wash your hands before touching ANYTHING related to the catheter or bag.  °· Using mild soap and warm water on a washcloth:  °· Clean the area closest to the catheter insertion site using a circular motion around the catheter.  °· Clean the catheter itself by wiping AWAY from the insertion site for several inches down the tube.  °· NEVER wipe upward as this could sweep bacteria up into the urethra (tube in your body that normally drains the bladder) and cause infection.  °· Place a small amount of sterile lubricant at the tip of the penis where the catheter is entering.  °Taking Care of the Drainage Bags °· Two drainage bags may be taken home: a large overnight drainage bag, and a smaller leg bag which fits underneath clothing.  °· It is okay to wear the overnight bag at any time, but NEVER wear the smaller leg bag at night.  °· Keep the drainage bag well below the level of your bladder. This prevents backflow of urine into the bladder and allows the urine to drain freely.  °· Anchor the tubing to your leg to prevent pulling or tension on the catheter. Use tape or a leg strap provided by the hospital.  °· Empty the drainage bag when it is 1/2 to 3/4 full. Wash your hands before and after touching the bag.  °· Periodically check the tubing for kinks to make sure there is no pressure on the tubing which could restrict  the flow of urine.  °Changing the Drainage Bags °· Cleanse both ends of the clean bag with alcohol before changing.  °· Pinch off the rubber catheter to avoid urine spillage during the disconnection.  °· Disconnect the dirty bag and connect the clean one.  °· Empty the dirty bag carefully to avoid a urine spill.  °· Attach the new bag to the leg with tape or a leg strap.  °Cleaning the Drainage Bags °· Whenever a drainage bag is disconnected, it must be cleaned quickly so it is ready for the next use.  °· Wash the bag in warm, soapy water.  °· Rinse the bag thoroughly with warm water.  °· Soak the bag for 30 minutes in a solution of white vinegar and water (1 cup vinegar to 1 quart warm water).  °· Rinse with warm water.  °SEEK MEDICAL   CARE IF:  °· You have chills or night sweats.  °· You are leaking around your catheter or have problems with your catheter. It is not uncommon to have sporadic leakage around your catheter as a result of bladder spasms. If the leakage stops, there is not much need for concern. If you are uncertain, call your caregiver.  °· You develop side effects that you think are coming from your medicines.  °SEEK IMMEDIATE MEDICAL CARE IF:  °· You are suddenly unable to urinate. Check to see if there are any kinks in the drainage tubing that may cause this. If you cannot find any kinks, call your caregiver immediately. This is an emergency.  °· You develop shortness of breath or chest pains.  °· Bleeding persists or clots develop in your urine.  °· You have a fever.  °· You develop pain in your back or over your lower belly (abdomen).  °· You develop pain or swelling in your legs.  °· Any problems you are having get worse rather than better.  °MAKE SURE YOU:  °· Understand these instructions.  °· Will watch your condition.  °· Will get help right away if you are not doing well or get worse.  °Document Released: 07/30/2005 Document Revised: 04/11/2011 Document Reviewed: 03/23/2009 °[employer]®  Patient Information ©2012 [employer].Transurethral Resection of the Prostate °Care After °Refer to this sheet in the next few weeks. These discharge instructions provide you with general information on caring for yourself after you leave the hospital. Your caregiver may also give you specific instructions. Your treatment has been planned according to the most current medical practices available, but unavoidable complications sometimes occur. If you have any problems or questions after discharge, please call your caregiver. °

## 2016-03-29 NOTE — Telephone Encounter (Signed)
Moved remaining RFs of atorvastatin and omeprazole to new pharm location. Dr Tamala Julian, please review zolpidem Rx. Pt last seen by you for chronic conditions in Jan.

## 2016-03-30 ENCOUNTER — Encounter (HOSPITAL_BASED_OUTPATIENT_CLINIC_OR_DEPARTMENT_OTHER): Payer: Self-pay | Admitting: Urology

## 2016-03-30 DIAGNOSIS — N403 Nodular prostate with lower urinary tract symptoms: Secondary | ICD-10-CM

## 2016-03-30 DIAGNOSIS — N138 Other obstructive and reflux uropathy: Secondary | ICD-10-CM | POA: Diagnosis present

## 2016-03-30 DIAGNOSIS — C675 Malignant neoplasm of bladder neck: Secondary | ICD-10-CM | POA: Diagnosis not present

## 2016-03-30 LAB — GLUCOSE, CAPILLARY: Glucose-Capillary: 233 mg/dL — ABNORMAL HIGH (ref 65–99)

## 2016-03-30 MED ORDER — OXYBUTYNIN CHLORIDE 5 MG PO TABS: 5.0000 mg | ORAL_TABLET | Freq: Three times a day (TID) | ORAL | 1 refills | Status: DC | PRN

## 2016-03-30 MED ORDER — DOCUSATE SODIUM 100 MG PO CAPS: 100.0000 mg | ORAL_CAPSULE | Freq: Two times a day (BID) | ORAL | 0 refills | Status: DC

## 2016-03-30 MED ORDER — SULFAMETHOXAZOLE-TRIMETHOPRIM 800-160 MG PO TABS: 1.0000 | ORAL_TABLET | Freq: Two times a day (BID) | ORAL | 0 refills | Status: DC

## 2016-03-30 NOTE — Telephone Encounter (Addendum)
Called in RF per pharm request.

## 2016-03-30 NOTE — Telephone Encounter (Signed)
Refill of Ambien approved.

## 2016-03-30 NOTE — Discharge Summary (Signed)
Patient ID: Alexander Wolf MRN: [id number] DOB/AGE: 60/23/1957 60 y.o.  Admit date: 03/29/2016 Discharge date: 03/30/2016  Primary Care Physician:  Reginia Forts, MD  Discharge Diagnoses:   Present on Admission: . BPH (benign prostatic hypertrophy) with urinary retention . Nodular prostate with urinary obstruction Bladder cancer  Consults: None   Discharge Medications:   Medication List    STOP taking these medications   aspirin 325 MG tablet     TAKE these medications   atorvastatin 40 MG tablet Commonly known as:  LIPITOR Take 1 tablet (40 mg total) by mouth daily. What changed:  when to take this   docusate sodium 100 MG capsule Commonly known as:  COLACE Take 1 capsule (100 mg total) by mouth 2 (two) times daily.   FISH OIL 300 MG Caps Take 600 capsules by mouth 2 (two) times daily.   glipiZIDE 5 MG tablet Commonly known as:  GLUCOTROL Take 1 tablet (5 mg total) by mouth 2 (two) times daily before a meal.   lisinopril 20 MG tablet Commonly known as:  PRINIVIL,ZESTRIL Take 1 tablet (20 mg total) by mouth daily. What changed:  when to take this   metFORMIN 1000 MG tablet Commonly known as:  GLUCOPHAGE Take 1 tablet (1,000 mg total) by mouth 2 (two) times daily with a meal.   metoprolol 50 MG tablet Commonly known as:  LOPRESSOR Take 1/2 tab PO BID What changed:  how much to take  how to take this  when to take this  additional instructions   omeprazole 40 MG capsule Commonly known as:  PRILOSEC TAKE 1 CAPSULE BY MOUTH EVERY DAY What changed:  how much to take  how to take this  when to take this  additional instructions   ondansetron 4 MG/2ML Soln injection Commonly known as:  ZOFRAN Inject 2 mLs (4 mg total) into the vein once as needed for nausea.   oxybutynin 5 MG tablet Commonly known as:  DITROPAN Take 1 tablet (5 mg total) by mouth every 8 (eight) hours as needed for bladder spasms.   oxyCODONE 5 MG immediate release  tablet Commonly known as:  Oxy IR/ROXICODONE Take 1 tablet (5 mg total) by mouth once as needed (for pain score of 1-4).   oxyCODONE-acetaminophen 5-325 MG tablet Commonly known as:  ROXICET Take 1-2 tablets by mouth every 4 (four) hours as needed for severe pain.   sulfamethoxazole-trimethoprim 800-160 MG tablet Commonly known as:  BACTRIM DS,SEPTRA DS Take 1 tablet by mouth every 12 (twelve) hours.   zolpidem 10 MG tablet Commonly known as:  AMBIEN TAKE 1 TABLET BY MOUTH EVERY NIGHT AT BEDTIME AS NEEDED FOR SLEEP What changed:  See the new instructions.        Significant Diagnostic Studies:  No results found.  Brief H and P: For complete details please refer to admission H and P, but in brief the pt was admitted for urinary obstruction possibly from BPH as well as invasive TCCa of the bladder.  Hospital Course:  Active Problems:   BPH (benign prostatic hypertrophy) with urinary retention   Nodular prostate with urinary obstruction The pt underwent TURBT and TURP on the day of admission. He had an uncomplicated postop course.  Day of Discharge BP 133/67 (BP Location: Right Arm)   Pulse 69   Temp 97.7 F (36.5 C) (Oral)   Resp 20   Ht 5\' 10"  (1.778 m)   Wt 104.8 kg (231 lb)   SpO2 98%   BMI 33.15  kg/m   Results for orders placed or performed during the hospital encounter of 03/29/16 (from the past 24 hour(s))  I-STAT 4, (NA,K, GLUC, HGB,HCT)     Status: Abnormal   Collection Time: 03/29/16  8:01 AM  Result Value Ref Range   Sodium 136 135 - 145 mmol/L   Potassium 4.5 3.5 - 5.1 mmol/L   Glucose, Bld 313 (H) 65 - 99 mg/dL   HCT 48.0 39.0 - 52.0 %   Hemoglobin 16.3 13.0 - 17.0 g/dL  Glucose, capillary     Status: Abnormal   Collection Time: 03/29/16 10:55 AM  Result Value Ref Range   Glucose-Capillary 279 (H) 65 - 99 mg/dL  Glucose, capillary     Status: Abnormal   Collection Time: 03/29/16 11:56 AM  Result Value Ref Range   Glucose-Capillary 293 (H) 65 - 99  mg/dL   Comment 1 Notify RN   Glucose, capillary     Status: Abnormal   Collection Time: 03/29/16  2:46 PM  Result Value Ref Range   Glucose-Capillary 302 (H) 65 - 99 mg/dL  Glucose, capillary     Status: Abnormal   Collection Time: 03/29/16  5:36 PM  Result Value Ref Range   Glucose-Capillary 350 (H) 65 - 99 mg/dL  Glucose, capillary     Status: Abnormal   Collection Time: 03/29/16 10:25 PM  Result Value Ref Range   Glucose-Capillary 275 (H) 65 - 99 mg/dL  Glucose, capillary     Status: Abnormal   Collection Time: 03/30/16  7:39 AM  Result Value Ref Range   Glucose-Capillary 233 (H) 65 - 99 mg/dL    Physical Exam: General: Alert and awake oriented x3 not in any acute distress. HEENT: anicteric sclera, pupils reactive to light and accommodation CVS: S1-S2 clear no murmur rubs or gallops Chest: clear to auscultation bilaterally, no wheezing rales or rhonchi Abdomen: soft nontender, nondistended, normal bowel sounds, no organomegaly Extremities: no cyanosis, clubbing or edema noted bilaterally Neuro: Cranial nerves II-XII intact, no focal neurological deficits  Disposition:  Home  Diet:  No restrictions  Activity:  Gradually increase   Disposition and Follow-up:    Will arrange f/u  TESTS THAT NEED FOLLOW-UP  Path review  DISCHARGE FOLLOW-UP   Time spent on Discharge:  15 mins  Signed: Jorja Loa 03/30/2016, 7:55 AM

## 2016-03-31 ENCOUNTER — Other Ambulatory Visit: Payer: Self-pay | Admitting: Family Medicine

## 2016-03-31 ENCOUNTER — Telehealth: Payer: Self-pay

## 2016-03-31 DIAGNOSIS — I10 Essential (primary) hypertension: Secondary | ICD-10-CM

## 2016-03-31 NOTE — Telephone Encounter (Signed)
Pt  Is needing a pain medication he states he had prostate surgery yesterday and pt states that dr Brigitte Pulse had prescribed him pain meds before for this same problem  Best number [phone]

## 2016-04-02 NOTE — Telephone Encounter (Signed)
Should pt get medication from Dr. Teresa Pelton?

## 2016-04-03 NOTE — Telephone Encounter (Signed)
Yes, dr. Diona Fanti prescribed him oxycodone 5mg  on 8/17. He should follow-up with urology for pain control from surgery - if Dr. Diona Fanti prefer that his PCP do this than have his office call and leave [place] a message.

## 2016-04-04 MED ORDER — LISINOPRIL 20 MG PO TABS: 20.0000 mg | ORAL_TABLET | Freq: Every day | ORAL | 1 refills | Status: DC

## 2016-04-04 NOTE — Telephone Encounter (Signed)
Pt did not need Hydrocodone he needed Lisinopril. Rx sent to [facility].

## 2016-04-07 ENCOUNTER — Ambulatory Visit (INDEPENDENT_AMBULATORY_CARE_PROVIDER_SITE_OTHER): Payer: 59 | Admitting: Family Medicine

## 2016-04-07 VITALS — BP 140/82 | HR 69 | Temp 97.5°F | Resp 18 | Ht 70.0 in | Wt 232.0 lb

## 2016-04-07 DIAGNOSIS — R319 Hematuria, unspecified: Secondary | ICD-10-CM

## 2016-04-07 LAB — POCT URINALYSIS DIP (MANUAL ENTRY)
BILIRUBIN UA: NEGATIVE
Bilirubin, UA: NEGATIVE
Glucose, UA: >=1000 — AB
Nitrite, UA: NEGATIVE
PH UA: 5.5
SPEC GRAV UA: 1.01
Urobilinogen, UA: 0.2

## 2016-04-07 LAB — POC MICROSCOPIC URINALYSIS (UMFC): MUCUS RE: ABSENT

## 2016-04-07 MED ORDER — DOCUSATE SODIUM 100 MG PO CAPS: 100.0000 mg | ORAL_CAPSULE | Freq: Two times a day (BID) | ORAL | 0 refills | Status: DC

## 2016-04-07 MED ORDER — OXYCODONE-ACETAMINOPHEN 5-325 MG PO TABS: 1.0000 | ORAL_TABLET | ORAL | 0 refills | Status: DC | PRN

## 2016-04-07 MED ORDER — DOCUSATE SODIUM 100 MG PO CAPS: 100.0000 mg | ORAL_CAPSULE | Freq: Two times a day (BID) | ORAL | 0 refills | Status: DC | PRN

## 2016-04-07 NOTE — Progress Notes (Signed)
By signing my name below I, Tereasa Coop, attest that this documentation has been prepared under the direction and in the presence of [name], MD. Electonically Signed. Tereasa Coop, Scribe 04/07/2016 at 2:52 PM  Subjective:    Patient ID: Alexander Wolf, male    DOB: 31-Mar-1956, 60 y.o.   MRN: [id number]  Chief Complaint  Patient presents with  . Post-op Problem    prostate pain/ Patient had surgery this thursday    HPI Alexander Wolf is a 61 y.o. male who presents to the [facility] complaining of prostate pain that has been constant since surgery on 03/29/16 and worsened severely today. Pt was seen and evaluated by Dr Brigitte Pulse at [facility] on 03/10/16 for urinary retention. Pt could not be catheterized at that time and was sent to the ED. Pt had a transurethral prostate resection on 03/29/16. Pt declined pain medication after the surgery, then the pain worsened after he got home. Pt has been urinating well since the surgery until today. Pt notes his urine has been pink in color and was becoming clear, then this morning he felt some mild urinary retention and strained to push the urine out and saw some bright pink urine.   Pt has been taking laxatives and having watery bowel movements regularly. Pt reports having a small firm BM this morning. Pt took another laxative after his last BM. Pt's prostate pain is often relieved by BM.   Pt had bladder cancer in the past and was being treated several years ago. 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 Pt is scheduled to see his oncologist within the next week.   Pt did not fill database reviewed, only narcotic in past 6 months was 30 oxycodone one month prior.    Patient Active Problem List   Diagnosis Date Noted  . Nodular prostate with urinary obstruction 03/30/2016  . BPH (benign prostatic hypertrophy) with urinary retention 03/29/2016  . 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 Essential hypertension 04/04/2014  . Hyperlipidemia LDL goal <70 04/04/2014  . Type 2 diabetes mellitus not at goal [facility]) 04/04/2014  . Hx of bladder cancer 04/04/2014    Current Outpatient Prescriptions on File Prior to Visit  Medication Sig Dispense Refill  . atorvastatin (LIPITOR) 40 MG tablet Take 1 tablet (40 mg total) by mouth daily. 90 tablet 1  . glipiZIDE (GLUCOTROL) 5 MG tablet Take 1 tablet (5 mg total) by mouth 2 (two) times daily before a meal. 180 tablet 3  . lisinopril (PRINIVIL,ZESTRIL) 20 MG tablet Take 1 tablet (20 mg total) by mouth daily. 90 tablet 1  . metFORMIN (GLUCOPHAGE) 1000 MG tablet Take 1 tablet (1,000 mg total) by mouth 2 (two) times daily with a meal. 180 tablet 1  . metoprolol (LOPRESSOR) 50 MG tablet Take 1/2 tab PO BID (Patient taking differently: Take 25 mg by mouth 2 (two) times daily. ) 90 tablet 1  . Omega-3 Fatty Acids (FISH OIL) 300 MG CAPS Take 600 capsules by mouth 2 (two) times daily.     Marland Kitchen omeprazole (PRILOSEC) 40 MG capsule TAKE 1 CAPSULE BY MOUTH EVERY DAY 90 capsule 1  . ondansetron (ZOFRAN) 4 MG/2ML SOLN injection Inject 2 mLs (4 mg total) into the vein once as needed for nausea. 2 mL 0  . oxybutynin (DITROPAN) 5 MG tablet Take 1 tablet (5 mg total) by  mouth every 8 (eight) hours as needed for bladder spasms. 30 tablet 1  . zolpidem (AMBIEN) 10 MG tablet TAKE 1 TABLET BY MOUTH EVERY NIGHT AT BEDTIME AS NEEDED FOR SLEEP 30 tablet 0   No current facility-administered medications on file prior to visit.     No Known Allergies  Depression screen [facility] 2/9 04/07/2016 03/10/2016 03/05/2016 05/27/2015 01/07/2015  Decreased Interest 0 0 0 0 0  Down, Depressed, Hopeless 0 0 3 0 0  PHQ - 2 Score 0 0 3 0 0  Altered sleeping - - 3 - -  Tired, decreased energy - - 3 - -  Change in appetite - - 3 - -  Feeling bad or failure about yourself  - - 0 - -  Trouble concentrating - - 0 - -  Moving slowly  or fidgety/restless - - 0 - -  Suicidal thoughts - - 0 - -  PHQ-9 Score - - 12 - -  Difficult doing work/chores - - Somewhat difficult - -       Review of Systems  Constitutional: Negative for fever.  HENT: Negative for sore throat.   Eyes: Negative for visual disturbance.  Respiratory: Negative for choking and shortness of breath.   Cardiovascular: Negative for chest pain.  Gastrointestinal: Positive for diarrhea (secondary to colace) and rectal pain (from prostate). Negative for abdominal pain.  Musculoskeletal: Negative for back pain.  Skin: Negative for rash.  Neurological: Negative for headaches.  Psychiatric/Behavioral: Negative for agitation.       Objective:   Physical Exam  Constitutional: He is oriented to person, place, and time. He appears well-developed and well-nourished.  Pt appears to be in pain and anxious, and cannot sit down.  HENT:  Head: Normocephalic and atraumatic.  Eyes: Conjunctivae are normal. Pupils are equal, round, and reactive to light.  Neck: Neck supple.  Cardiovascular: Normal rate.   Pulmonary/Chest: Effort normal.  Musculoskeletal: Normal range of motion.  Neurological: He is alert and oriented to person, place, and time.  Skin: Skin is warm and dry.  Psychiatric: His behavior is normal. His mood appears anxious.  Nursing note and vitals reviewed.    BP 140/82 (BP Location: Right Arm, Patient Position: Sitting, Cuff Size: Small)   Pulse 69   Temp 97.5 F (36.4 C) (Oral)   Resp 18   Ht 5\' 10"  (1.778 m)   Wt 232 lb (105.2 kg)   SpO2 98%   BMI 33.29 kg/m    Results for orders placed or performed in visit on 04/07/16  POCT urinalysis dipstick  Result Value Ref Range   Color, UA yellow yellow   Clarity, UA hazy (A) clear   Glucose, UA >=1,000 (A) negative   Bilirubin, UA negative negative   Ketones, POC UA negative negative   Spec Grav, UA 1.010    Blood, UA large (A) negative   pH, UA 5.5    Protein Ur, POC =100 (A)  negative   Urobilinogen, UA 0.2    Nitrite, UA Negative Negative   Leukocytes, UA Trace (A) Negative  POCT Microscopic Urinalysis ([facility])  Result Value Ref Range   WBC,UR,HPF,POC Few (A) None WBC/hpf   RBC,UR,HPF,POC Too numerous to count  (A) None RBC/hpf   Bacteria None None, Too numerous to count   Mucus Absent Absent   Epithelial Cells, UR Per Microscopy Few (A) None, Too numerous to count cells/hpf       Assessment & Plan:   1. Hematuria    ADVISED  PT THAT OK TO CALL IN IF HE NEEDS AN ADJUSTMENT IN PAIN MEDICATION   - HE JUST UNDERWENT SURGERY AND NOW IS PREPPING FOR POSS CHEMO FOLLOWED BY POTENTIAL ADDITIONAL SURGERY to treat the bladder cancer that has returned and now metastasized to his prostate.  He has been doing well mentally and physically with this info but now that his pain is spiking he is feeling a little anxious about his oncology apt in 3d and what that might entail.  Reviewed [facility] and pt has not had any opiate rxs other than the one I gave him 1 mo prior for the same.   UA returned after pt had left office - glucose is to high. In office pt states he has been taking both his metformin and glipizide as prescribed but per our [facility] he should have run out of metformin in April so not sure. . .  Will ask lab to call pt and check on his med compliance and have pt start checking his cbgs - call or RTC if to high - if elev, consider starting DPP4 inh  Orders Placed This Encounter  Procedures  . Urine culture  . POCT urinalysis dipstick  . POCT Microscopic Urinalysis ([facility])    Meds ordered this encounter  Medications  . oxyCODONE-acetaminophen (ROXICET) 5-325 MG tablet    Sig: Take 1-2 tablets by mouth every 4 (four) hours as needed for severe pain.    Dispense:  30 tablet    Refill:  0  . DISCONTD: docusate sodium (COLACE) 100 MG capsule    Sig: Take 1 capsule (100 mg total) by mouth 2 (two) times daily.    Dispense:  10 capsule    Refill:  0  . docusate sodium  (COLACE) 100 MG capsule    Sig: Take 1 capsule (100 mg total) by mouth 2 (two) times daily as needed for mild constipation.    Dispense:  60 capsule    Refill:  0    I personally performed the services described in this documentation, which was scribed in my presence. The recorded information has been reviewed and considered, and addended by me as needed.   [name], M.D.  [facility] [address] [place], [place] [address] [phone] phone [phone] fax  04/07/16 5:37 PM

## 2016-04-07 NOTE — Patient Instructions (Signed)
     IF you received an x-ray today, you will receive an invoice from [facility]. Please contact [facility] at [phone] with questions or concerns regarding your invoice.   IF you received labwork today, you will receive an invoice from [facility]. Please contact [facility] at [phone] with questions or concerns regarding your invoice.   Our billing staff will not be able to assist you with questions regarding bills from these companies.  You will be contacted with the lab results as soon as they are available. The fastest way to get your results is to activate your My Chart account. Instructions are located on the last page of this paperwork. If you have not heard from us regarding the results in 2 weeks, please contact this office.      

## 2016-04-08 ENCOUNTER — Other Ambulatory Visit: Payer: Self-pay | Admitting: Physician Assistant

## 2016-04-08 LAB — URINE CULTURE: ORGANISM ID, BACTERIA: NO GROWTH

## 2016-04-09 ENCOUNTER — Telehealth: Payer: Self-pay | Admitting: Emergency Medicine

## 2016-04-09 NOTE — Telephone Encounter (Signed)
-----   Message from Shawnee Knapp, MD sent at 04/07/2016  3:09 PM EDT ----- Please let Mr. Gerstenberger know that I am concerned his blood sugar is out of control which could make him prone to bladder and prostate irritation and infection - make sure he is taking both his metformin and glucotrol both twice a day. Start checking sugars and come into clinic if they are high.

## 2016-04-09 NOTE — Telephone Encounter (Signed)
Left message for patient to call back  

## 2016-04-09 NOTE — Progress Notes (Signed)
LMTRC

## 2016-04-10 ENCOUNTER — Telehealth: Payer: Self-pay | Admitting: Oncology

## 2016-04-10 ENCOUNTER — Ambulatory Visit (HOSPITAL_BASED_OUTPATIENT_CLINIC_OR_DEPARTMENT_OTHER): Payer: 59 | Admitting: Oncology

## 2016-04-10 VITALS — BP 124/76 | HR 63 | Temp 98.3°F | Resp 18 | Ht 70.0 in | Wt 236.6 lb

## 2016-04-10 DIAGNOSIS — C675 Malignant neoplasm of bladder neck: Secondary | ICD-10-CM

## 2016-04-10 DIAGNOSIS — E119 Type 2 diabetes mellitus without complications: Secondary | ICD-10-CM | POA: Diagnosis not present

## 2016-04-10 DIAGNOSIS — E785 Hyperlipidemia, unspecified: Secondary | ICD-10-CM | POA: Diagnosis not present

## 2016-04-10 DIAGNOSIS — I10 Essential (primary) hypertension: Secondary | ICD-10-CM | POA: Diagnosis not present

## 2016-04-10 DIAGNOSIS — C679 Malignant neoplasm of bladder, unspecified: Secondary | ICD-10-CM

## 2016-04-10 DIAGNOSIS — C674 Malignant neoplasm of posterior wall of bladder: Secondary | ICD-10-CM | POA: Insufficient documentation

## 2016-04-10 MED ORDER — LIDOCAINE-PRILOCAINE 2.5-2.5 % EX CREA: TOPICAL_CREAM | CUTANEOUS | 0 refills | Status: AC

## 2016-04-10 MED ORDER — PROCHLORPERAZINE MALEATE 10 MG PO TABS: 10.0000 mg | ORAL_TABLET | Freq: Four times a day (QID) | ORAL | 0 refills | Status: DC | PRN

## 2016-04-10 NOTE — Telephone Encounter (Signed)
AVS REPORT AND APPT SCHD GIVEN PER 08/29 LOS. °

## 2016-04-10 NOTE — Progress Notes (Signed)
Reason for Referral: Bladder cancer.   HPI: 60 year old gentleman native of [place] and currently resides in [place]. He is a gentleman with history of hypertension, hyperlipidemia and diabetes. He was diagnosed with bladder cancer 3 years ago and was treated by a urologist at [facility]. He had recurrent TURBT but no local treatment. He was recently evaluated by Dr. Diona Fanti for urinary retention and a CT scan of the abdomen and pelvis in August 2017 showed a 1.5 cm lesion in the bladder neck area. There is no evidence of disease outside of the bladder. He was initially seen in the emergency department and subsequently cystoscopic examination in the office was impossible due to obstruction and pain. He underwent TURBT on 03/30/2016 as well as TURP and the left bladder wall biopsy. He tolerated the procedure well and felt better since that time. The pathology from the procedure showed high-grade papillary urothelial carcinoma with tumor invades into the muscularis propria. There is also invasion with high-grade urothelial carcinoma involving the prostatic tissue. No other tumors were identified. Clinically, he is urinating freely at this time and denied hematuria or dysuria. He reports he is moving his bowel reasonably well although he feels a sense of incomplete emptying. He denied any musca skeletal complaints or respiratory symptoms.  He does not report any headaches, blurry vision, syncope or seizures. He does not report any fevers, chills, sweats. He does not report any chest pain, palpitation, orthopnea or leg edema. He does not report any cough, wheezing or hemoptysis. He does not report any nausea, vomiting, abdominal pain, gross satiety, constipation or diarrhea. He does not report any frequency, urgency or hesitancy. He denied dysuria or hematuria. He does not report any lymphadenopathy or petechiae. He does not report any skeletal complaints. Remaining review of systems  unremarkable.    Past Medical History:  Diagnosis Date  . Bladder cancer ([place])    [facility] in [place]; /  urologist in [place]-  dr dahlstedt  . BPH (benign prostatic hypertrophy) with urinary retention   . Coronary artery disease    s/p CABG  (per pt hasn't seen cardiologist for several years)  . Foley catheter in place   . GERD (gastroesophageal reflux disease)   . Hyperlipidemia   . Hypertension   . Insomnia   . 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 Type 2 diabetes mellitus ([place])   :  Past Surgical History:  Procedure Laterality Date  . CARDIAC CATHETERIZATION  10/26/2003  dr Leonia Reeves   ETT + ischemia/  severe 3 vessel avcad,  normal LVF, ef 55-60%  . CORONARY ARTERY BYPASS GRAFT  10-28-2003   dr Ricard Dillon   LIMA to dLAD,  RIMA to OM1,  SVG to OM2,  SVG to  right posterolateral   . CYSTOSCOPY N/A 03/29/2016   Procedure: CYSTOSCOPY;  Surgeon: Franchot Gallo, MD;  Location: [facility];  Service: Urology;  Laterality: N/A;  . TONSILLECTOMY  as child  . TRANSURETHRAL RESECTION OF BLADDER TUMOR  2014  . TRANSURETHRAL RESECTION OF BLADDER TUMOR WITH GYRUS (TURBT-GYRUS) N/A 03/29/2016   Procedure: TRANSURETHRAL RESECTION OF BLADDER TUMOR WITH GYRUS (TURBT-GYRUS);  Surgeon: Franchot Gallo, MD;  Location: [facility];  Service: Urology;  Laterality: N/A;  . TRANSURETHRAL RESECTION OF PROSTATE N/A 03/29/2016   Procedure: TRANSURETHRAL RESECTION OF THE PROSTATE (TURP);  Surgeon: Franchot Gallo, MD;  Location: [facility];  Service: Urology;  Laterality: N/A;  :   Current Outpatient Prescriptions:  .  atorvastatin (LIPITOR) 40 MG tablet, Take 1 tablet (40 mg total) by mouth daily., Disp: 90 tablet, Rfl: 1 .  docusate sodium (COLACE) 100 MG capsule, Take 1 capsule (100 mg total) by mouth 2 (two) times daily as needed for mild constipation., Disp: 60 capsule, Rfl: 0 .  glipiZIDE (GLUCOTROL) 5 MG tablet, Take 1 tablet (5 mg total) by mouth 2  (two) times daily before a meal., Disp: 180 tablet, Rfl: 3 .  lidocaine-prilocaine (EMLA) cream, Apply to port before chemotherapy., Disp: 30 g, Rfl: 0 .  lisinopril (PRINIVIL,ZESTRIL) 20 MG tablet, Take 1 tablet (20 mg total) by mouth daily., Disp: 90 tablet, Rfl: 1 .  metFORMIN (GLUCOPHAGE) 1000 MG tablet, Take 1 tablet (1,000 mg total) by mouth 2 (two) times daily with a meal., Disp: 180 tablet, Rfl: 1 .  metoprolol (LOPRESSOR) 50 MG tablet, Take 1/2 tab PO BID (Patient taking differently: Take 25 mg by mouth 2 (two) times daily. ), Disp: 90 tablet, Rfl: 1 .  Omega-3 Fatty Acids (FISH OIL) 300 MG CAPS, Take 600 capsules by mouth 2 (two) times daily. , Disp: , Rfl:  .  omeprazole (PRILOSEC) 40 MG capsule, TAKE 1 CAPSULE BY MOUTH EVERY DAY, Disp: 90 capsule, Rfl: 1 .  ondansetron (ZOFRAN) 4 MG/2ML SOLN injection, Inject 2 mLs (4 mg total) into the vein once as needed for nausea., Disp: 2 mL, Rfl: 0 .  oxybutynin (DITROPAN) 5 MG tablet, Take 1 tablet (5 mg total) by mouth every 8 (eight) hours as needed for bladder spasms., Disp: 30 tablet, Rfl: 1 .  oxyCODONE-acetaminophen (ROXICET) 5-325 MG tablet, Take 1-2 tablets by mouth every 4 (four) hours as needed for severe pain., Disp: 30 tablet, Rfl: 0 .  prochlorperazine (COMPAZINE) 10 MG tablet, Take 1 tablet (10 mg total) by mouth every 6 (six) hours as needed for nausea or vomiting., Disp: 30 tablet, Rfl: 0 .  zolpidem (AMBIEN) 10 MG tablet, TAKE 1 TABLET BY MOUTH EVERY NIGHT AT BEDTIME AS NEEDED FOR SLEEP, Disp: 30 tablet, Rfl: 0:  No Known Allergies:  Family History  Problem Relation Age of Onset  . Varicose Veins Mother   :  Social History   Social History  . Marital status: Married    Spouse name: N/A  . Number of children: 3  . Years of education: N/A   Occupational History  . welder    Social History Main Topics  . Smoking status: Former Smoker    Packs/day: 1.00    Years: 44.00    Types: Cigarettes    Quit date: 03/22/2012   . Smokeless tobacco: Never Used  . Alcohol use No  . Drug use: No  . Sexual activity: Yes   Other Topics Concern  . Not on file   Social History Narrative   Marital status: married x 12 years; second marriage.      Children: 3 children; 3 grandchildren; no gg      Lives: with wife      Employment:  Building control surveyor; Architect; 40 hours per week      Tobacco: quit in 3 years ago in 2013; smoked 45 years      Alcohol:  None      Drugs:  None      Exercise: works hard every day      Seatbelt:  100%; no texting while driving       :  Pertinent items are noted in HPI.  Exam: Blood pressure 124/76, pulse 63, temperature 98.3 F (  36.8 C), temperature source Oral, resp. rate 18, height 5\' 10"  (1.778 m), weight 236 lb 9.6 oz (107.3 kg), SpO2 98 %. General appearance: alert and cooperative Head: Normocephalic, without obvious abnormality Neck: no adenopathy Back: negative Resp: clear to auscultation bilaterally Chest wall: no tenderness Cardio: regular rate and rhythm, S1, S2 normal, no murmur, click, rub or gallop GI: soft, non-tender; bowel sounds normal; no masses,  no organomegaly Extremities: extremities normal, atraumatic, no cyanosis or edema Pulses: 2+ and symmetric  CBC    Component Value Date/Time   WBC 12.7 (A) 03/05/2016 1538   WBC 8.5 09/09/2015 1001   RBC 5.23 03/05/2016 1538   RBC 5.20 09/09/2015 1001   HGB 16.3 03/29/2016 0801   HCT 48.0 03/29/2016 0801   PLT 268 09/09/2015 1001   MCV 84.2 03/05/2016 1538   MCH 28.7 03/05/2016 1538   MCH 29.2 09/09/2015 1001   MCHC 34.1 03/05/2016 1538   MCHC 34.2 09/09/2015 1001   RDW 13.8 09/09/2015 1001   LYMPHSABS 3.3 09/09/2015 1001   MONOABS 0.8 09/09/2015 1001   EOSABS 0.1 09/09/2015 1001   BASOSABS 0.0 09/09/2015 1001      Chemistry      Component Value Date/Time   NA 136 03/29/2016 0801   K 4.5 03/29/2016 0801   CL 99 03/05/2016 1450   CO2 28 03/05/2016 1450   BUN 14 03/05/2016 1450   CREATININE 0.78  03/05/2016 1450      Component Value Date/Time   CALCIUM 9.6 03/05/2016 1450   ALKPHOS 101 03/05/2016 1450   AST 14 03/05/2016 1450   ALT 8 (L) 03/05/2016 1450   BILITOT 0.8 03/05/2016 1450       Assessment and Plan:   60 year old gentleman with the following issues:  1. Urothelial carcinoma arising from the bladder neck presented with a tumor measuring 1.5 cm with a previous history of superficial bladder tumors. His tumor invades into the prostatic stroma without any evidence of local lymphadenopathy or distantl metastasis. His pathological staging was T4a, NX.  The natural course of this disease was discussed with the patient and his wife. Curative options at this time were reviewed which include radical cystoprostatectomy after neoadjuvant systemic chemotherapy. The rationale for using systemic chemotherapy in this setting was reviewed and improvement in overall survival as well as progression free survival have been documented with platinum-based regimen in this setting.  The logistics of administration of cisplatin and gemcitabine were reviewed. Complications include nausea, vomiting, myelosuppression, neutropenia, neutropenic sepsis were also discussed. Complications with cisplatin that include nephrotoxicity, autotoxicity as well as deep vein thrombosis and rarely severe morbidity and death. The duration of therapy is anticipated 3-4 cycles given in on day 1 and day 8 of 21 day cycle.  He is agreeable to proceed after chemotherapy education class.  2. IV access: Risks and benefits of a Port-A-Cath insertion were reviewed. He is agreeable to have that done prior to chemotherapy. I referred to interventional radiology is placed. EMLA cream will be prescribed with instructions how to use it.  3. Antiemetics: Prescription for Compazine made available to the patient.  4. Renal function surveillance: The importance of avoiding nephrotoxic aeration were reviewed including NSAIDs among  other drugs. Adequate hydration was also encouraged.  5. DVT prophylaxis: He has increased risk of developing thrombosis and discussed recommendations to decrease his risk which include ambulation and sciatic with stretching.  6. Follow-up: Will be in 2 weeks to start systemic chemotherapy which I anticipate will last close to  12 weeks.

## 2016-04-11 ENCOUNTER — Other Ambulatory Visit: Payer: Self-pay | Admitting: *Deleted

## 2016-04-11 DIAGNOSIS — E119 Type 2 diabetes mellitus without complications: Secondary | ICD-10-CM

## 2016-04-11 MED ORDER — METFORMIN HCL 1000 MG PO TABS: 1000.0000 mg | ORAL_TABLET | Freq: Two times a day (BID) | ORAL | 0 refills | Status: DC

## 2016-04-11 NOTE — Telephone Encounter (Signed)
Patient called said blood sugars are high.  He was out of Metformin called prescription in, but advised patient he needs to come in and see Dr. Brigitte Pulse.  Patient states he is going to call Friday to get an appointment for Saturday.

## 2016-04-12 ENCOUNTER — Other Ambulatory Visit: Payer: 59

## 2016-04-12 ENCOUNTER — Telehealth: Payer: Self-pay | Admitting: *Deleted

## 2016-04-14 ENCOUNTER — Other Ambulatory Visit: Payer: Self-pay | Admitting: Family Medicine

## 2016-04-14 ENCOUNTER — Ambulatory Visit (INDEPENDENT_AMBULATORY_CARE_PROVIDER_SITE_OTHER): Payer: 59 | Admitting: Family Medicine

## 2016-04-14 VITALS — BP 112/78 | HR 86 | Temp 98.6°F | Resp 16 | Ht 70.0 in | Wt 235.0 lb

## 2016-04-14 DIAGNOSIS — N4281 Prostatodynia syndrome: Secondary | ICD-10-CM

## 2016-04-14 DIAGNOSIS — E119 Type 2 diabetes mellitus without complications: Secondary | ICD-10-CM | POA: Diagnosis not present

## 2016-04-14 DIAGNOSIS — C7989 Secondary malignant neoplasm of other specified sites: Secondary | ICD-10-CM

## 2016-04-14 DIAGNOSIS — Z9079 Acquired absence of other genital organ(s): Secondary | ICD-10-CM | POA: Diagnosis not present

## 2016-04-14 DIAGNOSIS — C679 Malignant neoplasm of bladder, unspecified: Secondary | ICD-10-CM

## 2016-04-14 MED ORDER — AMITRIPTYLINE HCL 25 MG PO TABS: 25.0000 mg | ORAL_TABLET | Freq: Every day | ORAL | 1 refills | Status: DC

## 2016-04-14 MED ORDER — ZOLPIDEM TARTRATE ER 12.5 MG PO TBCR: 12.5000 mg | EXTENDED_RELEASE_TABLET | Freq: Every evening | ORAL | 0 refills | Status: DC | PRN

## 2016-04-14 MED ORDER — OXYCODONE HCL ER 10 MG PO T12A: 10.0000 mg | EXTENDED_RELEASE_TABLET | Freq: Two times a day (BID) | ORAL | 0 refills | Status: DC

## 2016-04-14 MED ORDER — POLYETHYLENE GLYCOL 3350 17 GM/SCOOP PO POWD: 17.0000 g | Freq: Four times a day (QID) | ORAL | 11 refills | Status: DC | PRN

## 2016-04-14 MED ORDER — OXYCODONE-ACETAMINOPHEN 5-325 MG PO TABS: 1.0000 | ORAL_TABLET | ORAL | 0 refills | Status: DC | PRN

## 2016-04-14 NOTE — Patient Instructions (Addendum)
IF you received an x-ray today, you will receive an invoice from [facility]. Please contact [facility] at [phone] with questions or concerns regarding your invoice.   IF you received labwork today, you will receive an invoice from [employer]. Please contact [facility] at [phone] with questions or concerns regarding your invoice.   Our billing staff will not be able to assist you with questions regarding bills from these companies.  You will be contacted with the lab results as soon as they are available. The fastest way to get your results is to activate your My Chart account. Instructions are located on the last page of this paperwork. If you have not heard from [place] regarding the results in 2 weeks, please contact this office.    Preventing Constipation After Surgery Constipation is when a person has fewer than 3 bowel movements a week; has difficulty having a bowel movement; or has stools that are dry, hard, or larger than normal. Many things can make constipation likely after surgery. They include:  Medicines, especially numbing medicines (anesthetics) and very strong pain medicines called narcotics.  Feeling stressed because of the surgery.  Eating different foods than normal.  Being less active. Symptoms of constipation include:  Having fewer than 3 bowel movements a week.  Straining to have a bowel movement.  Having hard, dry, or larger-than-normal stools.  Feeling full or bloated.  Having pain in the lower abdomen.  Not feeling relief after having a bowel movement. HOME CARE INSTRUCTIONS  Diet  Eat foods that have a lot of fiber. These include fruits, vegetables, whole grains, and beans. Limit foods high in fat and processed sugars. These include french fries, hamburgers, cookies, and candy.  Take a fiber supplement as directed. If you are not taking a fiber supplement and think that you are not getting enough  fiber from foods, talk to your health care provider about adding a fiber supplement to your diet.  Drink clear fluids, especially water. Avoid drinking alcohol, caffeine, and soda. These can make constipation worse.  Drink enough fluids to keep your urine clear or pale yellow. Activity   After surgery, return to your normal activities slowly or when your health care provider says it is okay.  Start walking as soon as you can. Try to go a little farther each day.  Once your health care provider approves, do some sort of regular exercise. This helps prevent constipation. Bowel Movements  Go to the restroom when you have the urge to go. Do not hold it in.  Try drinking something hot to get a bowel movement started.  Keep track of how often you use the restroom. If you miss 2-3 bowel movements, talk to your health care provider about medicines that prevent constipation. Your health care provider may suggest a stool softener, laxative, or fiber supplement.  Only take over-the-counter or prescription medicines as directed by your health care provider.  Do not take other medicines without talking to your health care provider first. If you become constipated and take a medicine to make you have a bowel movement, the problem may get worse. Other kinds of medicine can also make the problem worse. SEEK MEDICAL CARE IF:  You used stool softeners or laxatives and still have not had a bowel movement within 24-48 hours after using them.  You have not had a bowel movement in 3 days. SEEK IMMEDIATE MEDICAL CARE IF:   Your constipation lasts for more than 4 days  or gets worse.  You have bright red blood in your stool.  You have abdominal or rectal pain.  You have very bad cramping.  You have thin, pencil-like stools.  You have unexplained weight loss.  You have a fever or persistent symptoms for more than 2-3 days.  You have a fever and your symptoms suddenly get worse.   This information  is not intended to replace advice given to you by your health care provider. Make sure you discuss any questions you have with your health care provider.   Document Released: 11/24/2012 Document Revised: 08/20/2014 Document Reviewed: 11/24/2012 Elsevier Interactive Patient Education [employer].

## 2016-04-14 NOTE — Progress Notes (Addendum)
Subjective:    Patient ID: Alexander Wolf, male    DOB: 15-Jul-1956, 60 y.o.   MRN: [id number] By signing my name below, I, [name], attest that this documentation has been prepared under the direction and in the presence of [name], MD. Electronically Signed: [name], ER Scribe. 04/14/2016. 2:15 PM.  Chief Complaint  Patient presents with  . Other    DM check   . Abdominal Pain    States he is having pain from prostate  . Insomnia    HPI HPI Comments: Alexander Wolf is a 60 y.o. male who presents to [facility] reporting for a medication refill and pain management. He denies nausea. He takes omeprazole daily. He switched to crestor two months ago from Lipitor. He takes fish oil. He takes glipizide daily. He denies any side effects due to his DM medications. He states he is not sleeping well. The Lorrin Mais puts him to sleep but he wakes up several hours later due to the pain and the need to urinate. He is taking significant laxities because his is very afraid of being constipated because normal BM are very painful. He also states he believes he is having urinary retention.   He used 30 oxycodone in one week.  Alexander Wolf hx of bladder cancer, diagnosed and treated several years prior. He did not follow up or follow care advice. Recently he had recurrence into his prostate and underwent a TERP two weeks prior. I saw him one week ago for pain medication, four days ago he had his initial visit with oncology. He has a high greade papularly carcinoma with invasion into prostate. He was recommended to have a radicle cystoprostatectomy after neoadjuvant chemotherapy with cisplatin and gemcitabine. He agreed to proceed as advised, and will have a portacath placed and follow up in three weeks to start chemo which will likely last six weeks followed by surgery. He has a chronic disease, HTN DM type II, HLD. Six weeks prior A1C had improved from 9.3 to 8.3, with normal AFTs and creatinine of 0.8.  microalbumin done 02/16. Lipid panel with LDL of 70 and non HDL of 111 six weeks prior.    Past Medical History:  Diagnosis Date  . Bladder cancer ([place])    [facility] in [place], [place]; /  urologist in [place]-  dr dahlstedt  . BPH (benign prostatic hypertrophy) with urinary retention   . Coronary artery disease    s/p CABG  (per pt hasn't seen cardiologist for several years)  . Foley catheter in place   . GERD (gastroesophageal reflux disease)   . Hyperlipidemia   . Hypertension   . Insomnia   . 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 Type 2 diabetes mellitus ([facility])    No Known Allergies  Current Outpatient Prescriptions on File Prior to Visit  Medication Sig Dispense Refill  . atorvastatin (LIPITOR) 40 MG tablet Take 1 tablet (40 mg total) by mouth daily. 90 tablet 1  . glipiZIDE (GLUCOTROL) 5 MG tablet Take 1 tablet (5 mg total) by mouth 2 (two) times daily before a meal. 180 tablet 3  . lidocaine-prilocaine (EMLA) cream Apply to port before chemotherapy. 30 g 0  . lisinopril (PRINIVIL,ZESTRIL) 20 MG tablet Take 1 tablet (20 mg total) by mouth daily. 90 tablet 1  . metFORMIN (GLUCOPHAGE) 1000 MG tablet Take 1 tablet (1,000 mg total) by mouth 2 (two) times daily with a meal. 180 tablet 0  .  metoprolol (LOPRESSOR) 50 MG tablet Take 1/2 tab PO BID (Patient taking differently: Take 25 mg by mouth 2 (two) times daily. ) 90 tablet 1  . Omega-3 Fatty Acids (FISH OIL) 300 MG CAPS Take 600 capsules by mouth 2 (two) times daily.     Marland Kitchen omeprazole (PRILOSEC) 40 MG capsule TAKE 1 CAPSULE BY MOUTH EVERY DAY 90 capsule 1  . oxybutynin (DITROPAN) 5 MG tablet Take 1 tablet (5 mg total) by mouth every 8 (eight) hours as needed for bladder spasms. 30 tablet 1  . oxyCODONE-acetaminophen (ROXICET) 5-325 MG tablet Take 1-2 tablets by mouth every 4 (four) hours as needed for severe pain. 30 tablet 0  . prochlorperazine (COMPAZINE) 10 MG tablet Take 1 tablet (10 mg total) by mouth every 6 (six) hours as needed for  nausea or vomiting. 30 tablet 0  . [DISCONTINUED] zolpidem (AMBIEN) 10 MG tablet TAKE 1 TABLET BY MOUTH EVERY NIGHT AT BEDTIME AS NEEDED FOR SLEEP 30 tablet 0  . docusate sodium (COLACE) 100 MG capsule Take 1 capsule (100 mg total) by mouth 2 (two) times daily as needed for mild constipation. (Patient not taking: Reported on 04/14/2016) 60 capsule 0   No current facility-administered medications on file prior to visit.      Review of Systems 10 point ROS reviewed, and all sx negative other than were listed above.     Objective:   Physical Exam  Constitutional: He is oriented to person, place, and time. He appears well-developed and well-nourished. No distress.  HENT:  Head: Normocephalic and atraumatic.  Eyes: Pupils are equal, round, and reactive to light.  Neck: Neck supple.  Cardiovascular: Normal rate.   Pulmonary/Chest: Effort normal. No respiratory distress.  Musculoskeletal: Normal range of motion.  Neurological: He is alert and oriented to person, place, and time. Coordination normal.  Skin: Skin is warm and dry. He is not diaphoretic.  Psychiatric: He has a normal mood and affect. His behavior is normal.  Nursing note and vitals reviewed.  BP 112/78   Pulse 86   Temp 98.6 F (37 C) (Oral)   Resp 16   Ht 5\' 10"  (1.778 m)   Wt 235 lb (106.6 kg)   SpO2 94%   BMI 33.72 kg/m   Results for orders placed or performed in visit on 04/07/16  Urine culture  Result Value Ref Range   Organism ID, Bacteria NO GROWTH   POCT urinalysis dipstick  Result Value Ref Range   Color, UA yellow yellow   Clarity, UA hazy (A) clear   Glucose, UA >=1,000 (A) negative   Bilirubin, UA negative negative   Ketones, POC UA negative negative   Spec Grav, UA 1.010    Blood, UA large (A) negative   pH, UA 5.5    Protein Ur, POC =100 (A) negative   Urobilinogen, UA 0.2    Nitrite, UA Negative Negative   Leukocytes, UA Trace (A) Negative  POCT Microscopic Urinalysis ([facility])  Result Value  Ref Range   WBC,UR,HPF,POC Few (A) None WBC/hpf   RBC,UR,HPF,POC Too numerous to count  (A) None RBC/hpf   Bacteria None None, Too numerous to count   Mucus Absent Absent   Epithelial Cells, UR Per Microscopy Few (A) None, Too numerous to count cells/hpf       Assessment & Plan:  Type 2 diabetes mellitus not at goal [facility])  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  He is having a hard time sitting and dreads urination and defecation due to the pain.  He is standing most of the time - sitting is agony.  Sxs minimally controlled on oxycodone 5mg  q4 hrs (using about 4x/d).  As pt's pain is continuous and exacerbated by freq activities such as sitting and defecation, I think it would be best to change him to an extended release regimen using the current med dose he is taking, then will cover with IR oxycodone for acute exacerbations.   Advised to keep stools VERY soft by starting miralax and use bid - titrate as needed to keep stools at desired consistency. Pt on zolpidem 10mg  qhs for long time and waking after 3-4 hrs then spending most of night pacing floor due to pain and insomnia.  Will change to CR zolpidem in hopes of keeping asleep. Also start elavil to help with pain and sleep - will likely need to titrate up.  This and narcotics may exac urinary retention - monitor closely.  Meds ordered this encounter  Medications  . oxyCODONE-acetaminophen (ROXICET) 5-325 MG tablet    Sig: Take 1-2 tablets by mouth every 4 (four) hours as needed for severe pain.    Dispense:  120 tablet    Refill:  0  . zolpidem (AMBIEN CR) 12.5 MG CR tablet    Sig: Take 1 tablet (12.5 mg total) by mouth at bedtime as needed for sleep.    Dispense:  30 tablet    Refill:  0  . oxyCODONE (OXYCONTIN) 10 mg 12 hr tablet    Sig: Take 1 tablet (10 mg total) by mouth  every 12 (twelve) hours.    Dispense:  60 tablet    Refill:  0  . amitriptyline (ELAVIL) 25 MG tablet    Sig: Take 1 tablet (25 mg total) by mouth at bedtime.    Dispense:  30 tablet    Refill:  1  . polyethylene glycol powder (GLYCOLAX/MIRALAX) powder    Sig: Take 17 g by mouth 4 (four) times daily as needed.    Dispense:  500 g    Refill:  11   Over 25 min spent in face-to-face evaluation of and consultation with patient and coordination of care.  Over 50% of this time was spent counseling this patient.   I personally performed the services described in this documentation, which was scribed in my presence. The recorded information has been reviewed and considered, and addended by me as needed.   [name], M.D.  [facility] [address] [place], [place] [address] [phone] phone [phone] fax  04/19/16 11:15 PM

## 2016-04-17 ENCOUNTER — Other Ambulatory Visit: Payer: Self-pay | Admitting: Radiology

## 2016-04-17 ENCOUNTER — Telehealth: Payer: Self-pay

## 2016-04-17 NOTE — Telephone Encounter (Signed)
Patient called stating that he needs approval in order for prescriptions to be covered via insurance. Medications are Oxycodone 10 mg 12 hr tablet and Zolpidem 12.5MG  cr tablet. Please advise. [phone]

## 2016-04-18 ENCOUNTER — Other Ambulatory Visit: Payer: Self-pay | Admitting: Radiology

## 2016-04-18 ENCOUNTER — Encounter: Payer: Self-pay | Admitting: *Deleted

## 2016-04-18 ENCOUNTER — Other Ambulatory Visit: Payer: 59

## 2016-04-19 ENCOUNTER — Encounter (HOSPITAL_COMMUNITY): Payer: Self-pay

## 2016-04-19 ENCOUNTER — Other Ambulatory Visit: Payer: Self-pay | Admitting: Oncology

## 2016-04-19 ENCOUNTER — Ambulatory Visit (HOSPITAL_COMMUNITY)
Admission: RE | Admit: 2016-04-19 | Discharge: 2016-04-19 | Disposition: A | Discharge status: Home / Self Care | Payer: 59 | Source: Ambulatory Visit | Attending: Oncology | Admitting: Oncology

## 2016-04-19 DIAGNOSIS — I10 Essential (primary) hypertension: Secondary | ICD-10-CM | POA: Insufficient documentation

## 2016-04-19 DIAGNOSIS — E119 Type 2 diabetes mellitus without complications: Secondary | ICD-10-CM | POA: Insufficient documentation

## 2016-04-19 DIAGNOSIS — R338 Other retention of urine: Secondary | ICD-10-CM | POA: Insufficient documentation

## 2016-04-19 DIAGNOSIS — Z951 Presence of aortocoronary bypass graft: Secondary | ICD-10-CM | POA: Insufficient documentation

## 2016-04-19 DIAGNOSIS — Z7984 Long term (current) use of oral hypoglycemic drugs: Secondary | ICD-10-CM | POA: Insufficient documentation

## 2016-04-19 DIAGNOSIS — E785 Hyperlipidemia, unspecified: Secondary | ICD-10-CM | POA: Diagnosis not present

## 2016-04-19 DIAGNOSIS — I2510 Atherosclerotic heart disease of native coronary artery without angina pectoris: Secondary | ICD-10-CM | POA: Diagnosis not present

## 2016-04-19 DIAGNOSIS — N401 Enlarged prostate with lower urinary tract symptoms: Secondary | ICD-10-CM | POA: Diagnosis not present

## 2016-04-19 DIAGNOSIS — G4700 Insomnia, unspecified: Secondary | ICD-10-CM | POA: Diagnosis not present

## 2016-04-19 DIAGNOSIS — C679 Malignant neoplasm of bladder, unspecified: Secondary | ICD-10-CM

## 2016-04-19 DIAGNOSIS — K219 Gastro-esophageal reflux disease without esophagitis: Secondary | ICD-10-CM | POA: Insufficient documentation

## 2016-04-19 DIAGNOSIS — Z9221 Personal history of antineoplastic chemotherapy: Secondary | ICD-10-CM | POA: Insufficient documentation

## 2016-04-19 DIAGNOSIS — Z87891 Personal history of nicotine dependence: Secondary | ICD-10-CM | POA: Insufficient documentation

## 2016-04-19 HISTORY — PX: IR GENERIC HISTORICAL: IMG1180011

## 2016-04-19 LAB — BASIC METABOLIC PANEL
Anion gap: 10 (ref 5–15)
BUN: 11 mg/dL (ref 6–20)
CALCIUM: 9.4 mg/dL (ref 8.9–10.3)
CO2: 26 mmol/L (ref 22–32)
Chloride: 101 mmol/L (ref 101–111)
Creatinine, Ser: 0.73 mg/dL (ref 0.61–1.24)
GFR calc Af Amer: >60 mL/min (ref >60)
Glucose, Bld: 111 mg/dL — ABNORMAL HIGH (ref 65–99)
POTASSIUM: 4.5 mmol/L (ref 3.5–5.1)
SODIUM: 137 mmol/L (ref 135–145)

## 2016-04-19 LAB — CBC WITH DIFFERENTIAL/PLATELET
BASOS ABS: 0 10*3/uL (ref 0.0–0.1)
BASOS PCT: 0 %
EOS ABS: 0.1 10*3/uL (ref 0.0–0.7)
EOS PCT: 2 %
HCT: 46 % (ref 39.0–52.0)
Hemoglobin: 15.2 g/dL (ref 13.0–17.0)
LYMPHS PCT: 36 %
Lymphs Abs: 3.3 10*3/uL (ref 0.7–4.0)
MCH: 28.4 pg (ref 26.0–34.0)
MCHC: 33 g/dL (ref 30.0–36.0)
MCV: 86 fL (ref 78.0–100.0)
Monocytes Absolute: 1 10*3/uL (ref 0.1–1.0)
Monocytes Relative: 10 %
Neutro Abs: 4.7 10*3/uL (ref 1.7–7.7)
Neutrophils Relative %: 52 %
PLATELETS: 269 10*3/uL (ref 150–400)
RBC: 5.35 MIL/uL (ref 4.22–5.81)
RDW: 13.9 % (ref 11.5–15.5)
WBC: 9.1 10*3/uL (ref 4.0–10.5)

## 2016-04-19 LAB — GLUCOSE, CAPILLARY: GLUCOSE-CAPILLARY: 109 mg/dL — AB (ref 65–99)

## 2016-04-19 LAB — PROTIME-INR
INR: 0.98
PROTHROMBIN TIME: 13 s (ref 11.4–15.2)

## 2016-04-19 MED ORDER — MIDAZOLAM HCL 2 MG/2ML IJ SOLN
INTRAMUSCULAR | Status: AC | PRN
  Administered 2016-04-19 (×4): 1 mg via INTRAVENOUS
  Administered 2016-04-19: 2 mg via INTRAVENOUS

## 2016-04-19 MED ORDER — LIDOCAINE-EPINEPHRINE (PF) 2 %-1:200000 IJ SOLN
INTRAMUSCULAR | Status: AC
  Filled 2016-04-19: qty 20

## 2016-04-19 MED ORDER — FENTANYL CITRATE (PF) 100 MCG/2ML IJ SOLN
INTRAMUSCULAR | Status: AC | PRN
  Administered 2016-04-19: 50 ug via INTRAVENOUS
  Administered 2016-04-19 (×3): 25 ug via INTRAVENOUS

## 2016-04-19 MED ORDER — MIDAZOLAM HCL 2 MG/2ML IJ SOLN
INTRAMUSCULAR | Status: AC
  Filled 2016-04-19: qty 8

## 2016-04-19 MED ORDER — SODIUM CHLORIDE 0.9 % IV SOLN: INTRAVENOUS | Status: DC

## 2016-04-19 MED ORDER — LIDOCAINE HCL 1 % IJ SOLN
INTRAMUSCULAR | Status: AC | PRN
  Administered 2016-04-19: 20 mL

## 2016-04-19 MED ORDER — HEPARIN SOD (PORK) LOCK FLUSH 100 UNIT/ML IV SOLN
INTRAVENOUS | Status: AC | PRN
  Administered 2016-04-19: 500 [IU]

## 2016-04-19 MED ORDER — CEFAZOLIN SODIUM-DEXTROSE 2-4 GM/100ML-% IV SOLN
2.0000 g | INTRAVENOUS | Status: AC
  Administered 2016-04-19: 2 g via INTRAVENOUS
  Filled 2016-04-19: qty 100

## 2016-04-19 MED ORDER — FENTANYL CITRATE (PF) 100 MCG/2ML IJ SOLN
INTRAMUSCULAR | Status: DC | PRN
  Administered 2016-04-19: 25 ug via INTRAVENOUS

## 2016-04-19 MED ORDER — HEPARIN SOD (PORK) LOCK FLUSH 100 UNIT/ML IV SOLN
INTRAVENOUS | Status: AC
  Filled 2016-04-19: qty 5

## 2016-04-19 MED ORDER — FENTANYL CITRATE (PF) 100 MCG/2ML IJ SOLN
INTRAMUSCULAR | Status: AC
  Filled 2016-04-19: qty 4

## 2016-04-19 NOTE — Discharge Instructions (Signed)
Implanted Port Home Guide °An implanted port is a type of central line that is placed under the skin. Central lines are used to provide IV access when treatment or nutrition needs to be given through a person's veins. Implanted ports are used for long-term IV access. An implanted port may be placed because:  °· You need IV medicine that would be irritating to the small veins in your hands or arms.   °· You need long-term IV medicines, such as antibiotics.   °· You need IV nutrition for a long period.   °· You need frequent blood draws for lab tests.   °· You need dialysis.   °Implanted ports are usually placed in the chest area, but they can also be placed in the upper arm, the abdomen, or the leg. An implanted port has two main parts:  °· Reservoir. The reservoir is round and will appear as a small, raised area under your skin. The reservoir is the part where a needle is inserted to give medicines or draw blood.   °· Catheter. The catheter is a thin, flexible tube that extends from the reservoir. The catheter is placed into a large vein. Medicine that is inserted into the reservoir goes into the catheter and then into the vein.   °HOW WILL I CARE FOR MY INCISION SITE? °Do not get the incision site wet. Bathe or shower as directed by your health care provider.  °HOW IS MY PORT ACCESSED? °Special steps must be taken to access the port:  °· Before the port is accessed, a numbing cream can be placed on the skin. This helps numb the skin over the port site.   °· Your health care provider uses a sterile technique to access the port. °· Your health care provider must put on a mask and sterile gloves. °· The skin over your port is cleaned carefully with an antiseptic and allowed to dry. °· The port is gently pinched between sterile gloves, and a needle is inserted into the port. °· Only "non-coring" port needles should be used to access the port. Once the port is accessed, a blood return should be checked. This helps  ensure that the port is in the vein and is not clogged.   °· If your port needs to remain accessed for a constant infusion, a clear (transparent) bandage will be placed over the needle site. The bandage and needle will need to be changed every week, or as directed by your health care provider.   °· Keep the bandage covering the needle clean and dry. Do not get it wet. Follow your health care provider's instructions on how to take a shower or bath while the port is accessed.   °· If your port does not need to stay accessed, no bandage is needed over the port.   °WHAT IS FLUSHING? °Flushing helps keep the port from getting clogged. Follow your health care provider's instructions on how and when to flush the port. Ports are usually flushed with saline solution or a medicine called heparin. The need for flushing will depend on how the port is used.  °· If the port is used for intermittent medicines or blood draws, the port will need to be flushed:   °· After medicines have been given.   °· After blood has been drawn.   °· As part of routine maintenance.   °· If a constant infusion is running, the port may not need to be flushed.   °HOW LONG WILL MY PORT STAY IMPLANTED? °The port can stay in for as long as your health care   provider thinks it is needed. When it is time for the port to come out, surgery will be done to remove it. The procedure is similar to the one performed when the port was put in.  WHEN SHOULD I SEEK IMMEDIATE MEDICAL CARE? When you have an implanted port, you should seek immediate medical care if:   You notice a bad smell coming from the incision site.   You have swelling, redness, or drainage at the incision site.   You have more swelling or pain at the port site or the surrounding area.   You have a fever that is not controlled with medicine.   This information is not intended to replace advice given to you by your health care provider. Make sure you discuss any questions you have with  your health care provider.   Document Released: 07/30/2005 Document Revised: 05/20/2013 Document Reviewed: 04/06/2013 Elsevier Interactive Patient Education 2016 [place] Insertion, Care After Refer to this sheet in the next few weeks. These instructions provide you with information on caring for yourself after your procedure. Your health care provider may also give you more specific instructions. Your treatment has been planned according to current medical practices, but problems sometimes occur. Call your health care provider if you have any problems or questions after your procedure. WHAT TO EXPECT AFTER THE PROCEDURE After your procedure, it is typical to have the following:   Discomfort at the port insertion site. Ice packs to the area will help.  Bruising on the skin over the port. This will subside in 3-4 days.  May remove dressing and shower in 24 to 48 hours.  No Emla cream for 1 week. HOME CARE INSTRUCTIONS  After your port is placed, you will get a manufacturer's information card. The card has information about your port. Keep this card with you at all times.   Know what kind of port you have. There are many types of ports available.   Wear a medical alert bracelet in case of an emergency. This can help alert health care workers that you have a port.   The port can stay in for as long as your health care provider believes it is necessary.   A home health care nurse may give medicines and take care of the port.   You or a family member can get special training and directions for giving medicine and taking care of the port at home.  SEEK MEDICAL CARE IF:   Your port does not flush or you are unable to get a blood return.   You have a fever or chills. SEEK IMMEDIATE MEDICAL CARE IF:  You have new fluid or pus coming from your incision.   You notice a bad smell coming from your incision site.   You have swelling, pain, or more redness at the  incision or port site.   You have chest pain or shortness of breath.   This information is not intended to replace advice given to you by your health care provider. Make sure you discuss any questions you have with your health care provider.   Document Released: 05/20/2013 Document Revised: 08/04/2013 Document Reviewed: 05/20/2013 Elsevier Interactive Patient Education 2016 [employer]  Moderate Conscious Sedation, Adult, Care After Refer to this sheet in the next few weeks. These instructions provide you with information on caring for yourself after your procedure. Your health care provider may also give you more specific instructions. Your treatment has been planned according to current  medical practices, but problems sometimes occur. Call your health care provider if you have any problems or questions after your procedure. WHAT TO EXPECT AFTER THE PROCEDURE  After your procedure:  You may feel sleepy, clumsy, and have poor balance for several hours.  Vomiting may occur if you eat too soon after the procedure. HOME CARE INSTRUCTIONS  Do not participate in any activities where you could become injured for at least 24 hours. Do not:  Drive.  Swim.  Ride a bicycle.  Operate heavy machinery.  Cook.  Use power tools.  Climb ladders.  Work from a high place.  Do not make important decisions or sign legal documents until you are improved.  If you vomit, drink water, juice, or soup when you can drink without vomiting. Make sure you have little or no nausea before eating solid foods.  Only take over-the-counter or prescription medicines for pain, discomfort, or fever as directed by your health care provider.  Make sure you and your family fully understand everything about the medicines given to you, including what side effects may occur.  You should not drink alcohol, take sleeping pills, or take medicines that cause drowsiness for at least 24 hours.  If you smoke, do not  smoke without supervision.  If you are feeling better, you may resume normal activities 24 hours after you were sedated.  Keep all appointments with your health care provider. SEEK MEDICAL CARE IF:  Your skin is pale or bluish in color.  You continue to feel nauseous or vomit.  Your pain is getting worse and is not helped by medicine.  You have bleeding or swelling.  You are still sleepy or feeling clumsy after 24 hours. SEEK IMMEDIATE MEDICAL CARE IF:  You develop a rash.  You have difficulty breathing.  You develop any type of allergic problem.  You have a fever. MAKE SURE YOU:  Understand these instructions.  Will watch your condition.  Will get help right away if you are not doing well or get worse.   This information is not intended to replace advice given to you by your health care provider. Make sure you discuss any questions you have with your health care provider.   Document Released: 05/20/2013 Document Revised: 08/20/2014 Document Reviewed: 05/20/2013 Elsevier Interactive Patient Education [employer].

## 2016-04-19 NOTE — Consult Note (Signed)
Chief Complaint: Patient was seen in consultation today for port a cath placement  Referring Physician(s): Wyatt Portela  Supervising Physician: Corrie Mckusick  Patient Status: Outpatient  History of Present Illness: Alexander Wolf is a 60 y.o. male with history of recurrent bladder cancer who presents today for Port-A-Cath placement for chemotherapy.  Past Medical History:  Diagnosis Date  . Bladder cancer ([place])    [facility] in [place], [place]; /  urologist in [place]-  dr dahlstedt  . BPH (benign prostatic hypertrophy) with urinary retention   . Coronary artery disease    s/p CABG  (per pt hasn't seen cardiologist for several years)  . Foley catheter in place   . GERD (gastroesophageal reflux disease)   . Hyperlipidemia   . Hypertension   . Insomnia   . 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 Type 2 diabetes mellitus ([place])     Past Surgical History:  Procedure Laterality Date  . CARDIAC CATHETERIZATION  10/26/2003  dr Leonia Reeves   ETT + ischemia/  severe 3 vessel avcad,  normal LVF, ef 55-60%  . CORONARY ARTERY BYPASS GRAFT  10-28-2003   dr Ricard Dillon   LIMA to dLAD,  RIMA to OM1,  SVG to OM2,  SVG to  right posterolateral   . CYSTOSCOPY N/A 03/29/2016   Procedure: CYSTOSCOPY;  Surgeon: Franchot Gallo, MD;  Location: [facility];  Service: Urology;  Laterality: N/A;  . TONSILLECTOMY  as child  . TRANSURETHRAL RESECTION OF BLADDER TUMOR  2014  . TRANSURETHRAL RESECTION OF BLADDER TUMOR WITH GYRUS (TURBT-GYRUS) N/A 03/29/2016   Procedure: TRANSURETHRAL RESECTION OF BLADDER TUMOR WITH GYRUS (TURBT-GYRUS);  Surgeon: Franchot Gallo, MD;  Location[facility];  Service: Urology;  Laterality: N/A;  . TRANSURETHRAL RESECTION OF PROSTATE N/A 03/29/2016   Procedure: TRANSURETHRAL RESECTION OF THE PROSTATE (TURP);  Surgeon: Franchot Gallo, MD;  Location[facility];  Service: Urology;  Laterality: N/A;    Allergies: Review of patient's  allergies indicates no known allergies.  Medications: Prior to Admission medications   Medication Sig Start Date End Date Taking? Authorizing Provider  amitriptyline (ELAVIL) 25 MG tablet Take 1 tablet (25 mg total) by mouth at bedtime. 04/14/16  Yes Shawnee Knapp, MD  atorvastatin (LIPITOR) 40 MG tablet Take 1 tablet (40 mg total) by mouth daily. 03/29/16  Yes Wardell Honour, MD  docusate sodium (COLACE) 100 MG capsule Take 1 capsule (100 mg total) by mouth 2 (two) times daily as needed for mild constipation. 04/07/16  Yes Shawnee Knapp, MD  glipiZIDE (GLUCOTROL) 5 MG tablet Take 1 tablet (5 mg total) by mouth 2 (two) times daily before a meal. 05/27/15  Yes Wardell Honour, MD  lisinopril (PRINIVIL,ZESTRIL) 20 MG tablet Take 1 tablet (20 mg total) by mouth daily. 04/04/16  Yes Wardell Honour, MD  metFORMIN (GLUCOPHAGE) 1000 MG tablet Take 1 tablet (1,000 mg total) by mouth 2 (two) times daily with a meal. 04/11/16  Yes Tereasa Coop, PA-C  metoprolol (LOPRESSOR) 50 MG tablet Take 1/2 tab PO BID Patient taking differently: Take 25 mg by mouth 2 (two) times daily.  05/27/15  Yes Wardell Honour, MD  Omega-3 Fatty Acids (FISH OIL) 300 MG CAPS Take 600 capsules by mouth 2 (two) times daily.    Yes Historical Provider, MD  omeprazole (PRILOSEC) 40 MG capsule TAKE 1 CAPSULE BY MOUTH EVERY DAY 03/29/16  Yes Wardell Honour, MD  oxybutynin (DITROPAN) 5 MG tablet Take 1 tablet (5 mg total) by mouth every 8 (eight) hours as needed for bladder spasms. 03/30/16  Yes Franchot Gallo, MD  oxyCODONE-acetaminophen (ROXICET) 5-325 MG tablet Take 1-2 tablets by mouth every 4 (four) hours as needed for severe pain. 04/14/16  Yes Shawnee Knapp, MD  polyethylene glycol powder (GLYCOLAX/MIRALAX) powder Take 17 g by mouth 4 (four) times daily as needed. 04/14/16  Yes Shawnee Knapp, MD  zolpidem (AMBIEN CR) 12.5 MG CR tablet Take 1 tablet (12.5 mg total) by mouth at bedtime as needed for sleep. 04/14/16  Yes Shawnee Knapp, MD    lidocaine-prilocaine (EMLA) cream Apply to port before chemotherapy. 04/10/16   Wyatt Portela, MD  oxyCODONE (OXYCONTIN) 10 mg 12 hr tablet Take 1 tablet (10 mg total) by mouth every 12 (twelve) hours. 04/14/16   Shawnee Knapp, MD  prochlorperazine (COMPAZINE) 10 MG tablet Take 1 tablet (10 mg total) by mouth every 6 (six) hours as needed for nausea or vomiting. 04/10/16   Wyatt Portela, MD     Family History  Problem Relation Age of Onset  . Varicose Veins Mother     Social History   Social History  . Marital status: Married    Spouse name: N/A  . Number of children: 3  . Years of education: N/A   Occupational History  . welder    Social History Main Topics  . Smoking status: Former Smoker    Packs/day: 1.00    Years: 44.00    Types: Cigarettes    Quit date: 03/22/2012  . Smokeless tobacco: Never Used  . Alcohol use No  . Drug use: No  . Sexual activity: Yes   Other Topics Concern  . None   Social History Narrative   Marital status: married x 12 years; second marriage.      Children: 3 children; 3 grandchildren; no gg      Lives: with wife      Employment:  Building control surveyor; Architect; 40 hours per week      Tobacco: quit in 3 years ago in 2013; smoked 45 years      Alcohol:  None      Drugs:  None      Exercise: works hard every day      Seatbelt:  100%; no texting while driving          Review of Systems currently denies fever, chest pain, dyspnea, cough, back pain, nausea, vomiting; does have occasional headaches, left lower quadrant/inguinal discomfort, occasional blood tinged urine  Vital Signs: BP (!) 130/91 (BP Location: Left Arm)   Pulse 61   Temp 97.9 F (36.6 C) (Oral)   Resp 18   SpO2 100%   Physical Exam patient awake, alert. Chest clear to auscultation bilaterally; heart with regular rate and rhythm; abdomen soft, obese, positive bowel sounds, mildly tender left lower quadrant; lower extremities with no edema  Mallampati Score:     Imaging: No  results found.  Labs:  CBC:  Recent Labs  05/27/15 1122 09/09/15 1001 03/05/16 1538 03/29/16 0801 04/19/16 1245  WBC 9.0 8.5 12.7*  --  9.1  HGB 15.2 15.2 15.0 16.3 15.2  HCT 44.5 44.5 44.1 48.0 46.0  PLT 250 268  --   --  269    COAGS:  Recent Labs  04/19/16 1245  INR 0.98    BMP:  Recent Labs  05/27/15 1122 09/09/15 1001 03/05/16 1450 03/29/16 0801 04/19/16 1245  NA 138 137 139 136 137  K 4.5 4.2 4.2 4.5 4.5  CL 104 104 99  --  101  CO2 25 24 28   --  26  GLUCOSE 102* 137* 98 313* 111*  BUN 17 17 14   --  11  CALCIUM 9.3 8.7 9.6  --  9.4  CREATININE 0.75 0.70 0.78  --  0.73  GFRNONAA  --   --  >89  --  >60  GFRAA  --   --  >89  --  >60    LIVER FUNCTION TESTS:  Recent Labs  05/27/15 1122 09/09/15 1001 03/05/16 1450  BILITOT 0.6 0.6 0.8  AST 17 18 14   ALT 13 13 8*  ALKPHOS 82 93 101  PROT 6.9 6.7 7.0  ALBUMIN 4.4 4.1 4.7    TUMOR MARKERS: No results for input(s): AFPTM, CEA, CA199, CHROMGRNA in the last 8760 hours.  Assessment and Plan: 60 y.o. male with history of recurrent bladder cancer who presents today for Port-A-Cath placement for chemotherapy.Risks and benefits discussed with the patient/wife including, but not limited to bleeding, infection, pneumothorax, or fibrin sheath development and need for additional procedures.All of the patient's questions were answered, patient is agreeable to proceed.Consent signed and in chart.     Thank you for this interesting consult.  I greatly enjoyed meeting Alexander Wolf and look forward to participating in their care.  A copy of this report was sent to the requesting provider on this date.  Electronically Signed: D. Rowe Robert 04/19/2016, 2:41 PM   I spent a total of 25 minutes  in face to face in clinical consultation, greater than 50% of which was counseling/coordinating care for Port-A-Cath placement

## 2016-04-19 NOTE — Progress Notes (Signed)
Interventional Radiology Procedure Note  Procedure: Placement of a right IJ approach single lumen PowerPort.  Tip is positioned at the superior cavoatrial junction and catheter is ready for immediate use.  Complications: No immediate Recommendations:  - Ok to shower tomorrow - Do not submerge for 7 days - Routine line care   Signed,  Magdelena Kinsella S. Ira Dougher, DO    

## 2016-04-20 ENCOUNTER — Encounter: Payer: Self-pay | Admitting: Family Medicine

## 2016-04-20 NOTE — Telephone Encounter (Signed)
Dr Brigitte Pulse, I am trying to do the PA for these medications, and your notes are not completed yet. Can you please tell me what Oxycontin is being Rxd for and why the immed release is not working for him? Also, why does pt need the controlled release zolpidem? Thanks!

## 2016-04-23 NOTE — Telephone Encounter (Signed)
Sorry - note completed answering these questions. So sorry - thanks for all you do.

## 2016-04-24 NOTE — Telephone Encounter (Signed)
PAs completed for both meds and forms faxed to [facility]. Pending.

## 2016-04-25 ENCOUNTER — Ambulatory Visit (HOSPITAL_BASED_OUTPATIENT_CLINIC_OR_DEPARTMENT_OTHER): Payer: 59

## 2016-04-25 ENCOUNTER — Other Ambulatory Visit (HOSPITAL_BASED_OUTPATIENT_CLINIC_OR_DEPARTMENT_OTHER): Payer: 59

## 2016-04-25 VITALS — BP 146/81 | HR 70 | Temp 98.6°F | Resp 20

## 2016-04-25 DIAGNOSIS — Z8551 Personal history of malignant neoplasm of bladder: Secondary | ICD-10-CM

## 2016-04-25 DIAGNOSIS — C675 Malignant neoplasm of bladder neck: Secondary | ICD-10-CM

## 2016-04-25 DIAGNOSIS — C679 Malignant neoplasm of bladder, unspecified: Secondary | ICD-10-CM

## 2016-04-25 DIAGNOSIS — Z5111 Encounter for antineoplastic chemotherapy: Secondary | ICD-10-CM

## 2016-04-25 DIAGNOSIS — Z23 Encounter for immunization: Secondary | ICD-10-CM | POA: Diagnosis not present

## 2016-04-25 LAB — CBC WITH DIFFERENTIAL/PLATELET
BASO%: 0.3 % (ref 0.0–2.0)
Basophils Absolute: 0 10*3/uL (ref 0.0–0.1)
EOS ABS: 0.2 10*3/uL (ref 0.0–0.5)
EOS%: 1.7 % (ref 0.0–7.0)
HCT: 42.4 % (ref 38.4–49.9)
HEMOGLOBIN: 14.2 g/dL (ref 13.0–17.1)
LYMPH%: 28.3 % (ref 14.0–49.0)
MCH: 28.6 pg (ref 27.2–33.4)
MCHC: 33.5 g/dL (ref 32.0–36.0)
MCV: 85.5 fL (ref 79.3–98.0)
MONO#: 0.9 10*3/uL (ref 0.1–0.9)
MONO%: 9.5 % (ref 0.0–14.0)
NEUT%: 60.2 % (ref 39.0–75.0)
NEUTROS ABS: 6 10*3/uL (ref 1.5–6.5)
PLATELETS: 236 10*3/uL (ref 140–400)
RBC: 4.96 10*6/uL (ref 4.20–5.82)
RDW: 13.5 % (ref 11.0–14.6)
WBC: 9.9 10*3/uL (ref 4.0–10.3)
lymph#: 2.8 10*3/uL (ref 0.9–3.3)

## 2016-04-25 LAB — COMPREHENSIVE METABOLIC PANEL
ALBUMIN: 3.7 g/dL (ref 3.5–5.0)
ALK PHOS: 120 U/L (ref 40–150)
ALT: 12 U/L (ref 0–55)
ANION GAP: 11 meq/L (ref 3–11)
AST: 17 U/L (ref 5–34)
BILIRUBIN TOTAL: 0.57 mg/dL (ref 0.20–1.20)
BUN: 16 mg/dL (ref 7.0–26.0)
CO2: 21 mEq/L — ABNORMAL LOW (ref 22–29)
CREATININE: 0.9 mg/dL (ref 0.7–1.3)
Calcium: 9.3 mg/dL (ref 8.4–10.4)
Chloride: 102 mEq/L (ref 98–109)
GLUCOSE: 214 mg/dL — AB (ref 70–140)
Potassium: 4.2 mEq/L (ref 3.5–5.1)
SODIUM: 134 meq/L — AB (ref 136–145)
TOTAL PROTEIN: 7.4 g/dL (ref 6.4–8.3)

## 2016-04-25 MED ORDER — SODIUM CHLORIDE 0.9 % IV SOLN
1000.0000 mg/m2 | Freq: Once | INTRAVENOUS | Status: AC
  Administered 2016-04-25: 2318 mg via INTRAVENOUS
  Filled 2016-04-25: qty 60.96

## 2016-04-25 MED ORDER — POTASSIUM CHLORIDE 2 MEQ/ML IV SOLN
Freq: Once | INTRAVENOUS | Status: AC
  Administered 2016-04-25: 09:00:00 via INTRAVENOUS
  Filled 2016-04-25: qty 10

## 2016-04-25 MED ORDER — MORPHINE SULFATE 4 MG/ML IJ SOLN
2.0000 mg | Freq: Once | INTRAMUSCULAR | Status: AC
  Administered 2016-04-25: 2 mg via INTRAVENOUS
  Filled 2016-04-25: qty 1

## 2016-04-25 MED ORDER — FOSAPREPITANT DIMEGLUMINE INJECTION 150 MG
Freq: Once | INTRAVENOUS | Status: AC
  Administered 2016-04-25: 12:00:00 via INTRAVENOUS
  Filled 2016-04-25: qty 5

## 2016-04-25 MED ORDER — PALONOSETRON HCL INJECTION 0.25 MG/5ML
0.2500 mg | Freq: Once | INTRAVENOUS | Status: AC
  Administered 2016-04-25: 0.25 mg via INTRAVENOUS

## 2016-04-25 MED ORDER — SODIUM CHLORIDE 0.9% FLUSH
10.0000 mL | INTRAVENOUS | Status: DC | PRN
Start: 2016-04-25 — End: 2016-04-25
  Administered 2016-04-25: 10 mL
  Filled 2016-04-25: qty 10

## 2016-04-25 MED ORDER — MORPHINE SULFATE (PF) 4 MG/ML IV SOLN
INTRAVENOUS | Status: AC
  Filled 2016-04-25: qty 1

## 2016-04-25 MED ORDER — HEPARIN SOD (PORK) LOCK FLUSH 100 UNIT/ML IV SOLN
500.0000 [IU] | Freq: Once | INTRAVENOUS | Status: AC | PRN
  Administered 2016-04-25: 500 [IU]
  Filled 2016-04-25: qty 5

## 2016-04-25 MED ORDER — PALONOSETRON HCL INJECTION 0.25 MG/5ML
INTRAVENOUS | Status: AC
  Filled 2016-04-25: qty 5

## 2016-04-25 MED ORDER — SODIUM CHLORIDE 0.9 % IV SOLN
Freq: Once | INTRAVENOUS | Status: AC
Start: 2016-04-25 — End: 2016-04-25
  Administered 2016-04-25: 12:00:00 via INTRAVENOUS

## 2016-04-25 MED ORDER — INFLUENZA VAC SPLIT QUAD 0.5 ML IM SUSY
0.5000 mL | PREFILLED_SYRINGE | Freq: Once | INTRAMUSCULAR | Status: AC
  Administered 2016-04-25: 0.5 mL via INTRAMUSCULAR
  Filled 2016-04-25: qty 0.5

## 2016-04-25 MED ORDER — SODIUM CHLORIDE 0.9 % IV SOLN
65.0000 mg/m2 | Freq: Once | INTRAVENOUS | Status: AC
  Administered 2016-04-25: 150 mg via INTRAVENOUS
  Filled 2016-04-25: qty 150

## 2016-04-25 NOTE — Patient Instructions (Signed)
[place] Discharge Instructions for Patients Receiving Chemotherapy  Today you received the following chemotherapy agents:  Gemzar (gemcitabine), Cisplatin (platinol)  To help prevent nausea and vomiting after your treatment, we encourage you to take your nausea medication as prescribed.   If you develop nausea and vomiting that is not controlled by your nausea medication, call the clinic.   BELOW ARE SYMPTOMS THAT SHOULD BE REPORTED IMMEDIATELY:  *FEVER GREATER THAN 100.5 F  *CHILLS WITH OR WITHOUT FEVER  NAUSEA AND VOMITING THAT IS NOT CONTROLLED WITH YOUR NAUSEA MEDICATION  *UNUSUAL SHORTNESS OF BREATH  *UNUSUAL BRUISING OR BLEEDING  TENDERNESS IN MOUTH AND THROAT WITH OR WITHOUT PRESENCE OF ULCERS  *URINARY PROBLEMS  *BOWEL PROBLEMS  UNUSUAL RASH Items with * indicate a potential emergency and should be followed up as soon as possible.  Feel free to call the clinic you have any questions or concerns. The clinic phone number is [phone].  Please show the [place] at check-in to the Emergency Department and triage nurse.  Aprepitant capsules What is this medicine? APREPITANT (ap RE pi tant) is used with other medicines to prevent nausea and vomiting caused by cancer treatment (chemotherapy). It is also used alone to prevent nausea and vomiting caused by anesthesia used during surgery. This medicine may be used for other purposes; ask your health care provider or pharmacist if you have questions. What should I tell my health care provider before I take this medicine? They need to know if you have any of these conditions: -liver disease -an unusual or allergic reaction to aprepitant, fosaprepitant, other medicines, foods, dyes, or preservatives -pregnant or trying to get pregnant -breast-feeding How should I use this medicine? Take this medicine by mouth with a glass of water. Follow the directions on the prescription label. Usually, you  will take your first dose one hour before your chemotherapy begins, and then once daily in the morning for the next 2 days after your chemotherapy treatment. This medicine may be taken with or without food. Do not take more often than directed. Talk to your pediatrician regarding the use of this medicine in children. While this drug may be prescribed for children as young as 12 years for selected conditions, precautions do apply. Overdosage: If you think you have taken too much of this medicine contact a poison control center or emergency room at once. NOTE: This medicine is only for you. Do not share this medicine with others. What if I miss a dose? If you miss a dose, take it as soon as you can. If it is almost time for your next dose, take only that dose. Do not take double or extra doses. What may interact with this medicine? Do not take this medicine with any of these medicines: -cisapride -flibanserin -lomitapide -pimozide This medicine may also interact with the following medications: -diltiazem -male hormones, like estrogens or progestins and birth control pills -medicines for fungal infections like ketoconazole and itraconazole -medicines for HIV -medicines for seizures or to control epilepsy like carbamazepine or phenytoin -medicines used for sleep or anxiety disorders like alprazolam, diazepam, or midazolam -nefazodone -paroxetine -ranolazine -rifampin -some chemotherapy medications like etoposide, ifosfamide, vinblastine, vincristine -some antibiotics like clarithromycin, erythromycin, troleandomycin -steroid medicines like dexamethasone or methylprednisolone -tolbutamide -warfarin This list may not describe all possible interactions. Give your health care provider a list of all the medicines, herbs, non-prescription drugs, or dietary supplements you use. Also tell them if you smoke, drink alcohol, or use illegal  drugs. Some items may interact with your medicine. What  should I watch for while using this medicine? Do not take this medicine if you already have nausea and vomiting. Ask your health care provider what to do if you already have nausea. Birth control pills and other methods of hormonal contraception (for example, IUD or patch) may not work properly while you are taking this medicine. Use an extra method of birth control during treatment and for 1 month after your last dose of aprepitant. This medicine should not be used continuously for a long time. Visit your doctor or health care professional for regular check-ups. This medicine may change your liver function blood test results. What side effects may I notice from receiving this medicine? Side effects that you should report to your doctor or health care professional as soon as possible: -allergic reactions like skin rash, itching or hives, swelling of the face, lips, or tongue -breathing problems -changes in heart rhythm -high or low blood pressure -rectal bleeding -serious dizziness or disorientation, confusion -sharp or severe stomach pain -sharp pain in your leg Side effects that usually do not require medical attention (report to your doctor or health care professional if they continue or are bothersome): -constipation or diarrhea -hair loss -headache -hiccups -loss of appetite -nausea -upset stomach -tiredness This list may not describe all possible side effects. Call your doctor for medical advice about side effects. You may report side effects to FDA at 1-800-FDA-1088. Where should I keep my medicine? Keep out of the reach of children. Store at room temperature between 20 and 25 degrees C (68 and 77 degrees F). Throw away any unused medicine after the expiration date. NOTE: This sheet is a summary. It may not cover all possible information. If you have questions about this medicine, talk to your doctor, pharmacist, or health care provider.    2016, Elsevier/Gold Standard.  (2014-09-15 09:59:34) Palonosetron Injection What is this medicine? PALONOSETRON (pal oh NOE se tron) is used to prevent nausea and vomiting caused by chemotherapy. It also helps prevent delayed nausea and vomiting that may occur a few days after your treatment. This medicine may be used for other purposes; ask your health care provider or pharmacist if you have questions. What should I tell my health care provider before I take this medicine? They need to know if you have any of these conditions: -an unusual or allergic reaction to palonosetron, dolasetron, granisetron, ondansetron, other medicines, foods, dyes, or preservatives -pregnant or trying to get pregnant -breast-feeding How should I use this medicine? This medicine is for infusion into a vein. It is given by a health care professional in a hospital or clinic setting. Talk to your pediatrician regarding the use of this medicine in children. While this drug may be prescribed for children as young as 1 month for selected conditions, precautions do apply. Overdosage: If you think you have taken too much of this medicine contact a poison control center or emergency room at once. NOTE: This medicine is only for you. Do not share this medicine with others. What if I miss a dose? This does not apply. What may interact with this medicine? -certain medicines for depression, anxiety, or psychotic disturbances -fentanyl -linezolid -MAOIs like Carbex, Eldepryl, Marplan, Nardil, and Parnate -methylene blue (injected into a vein) -tramadol This list may not describe all possible interactions. Give your health care provider a list of all the medicines, herbs, non-prescription drugs, or dietary supplements you use. Also tell them if you smoke,  drink alcohol, or use illegal drugs. Some items may interact with your medicine. What should I watch for while using this medicine? Your condition will be monitored carefully while you are receiving this  medicine. What side effects may I notice from receiving this medicine? Side effects that you should report to your doctor or health care professional as soon as possible: -allergic reactions like skin rash, itching or hives, swelling of the face, lips, or tongue -breathing problems -confusion -dizziness -fast, irregular heartbeat -fever and chills -loss of balance or coordination -seizures -sweating -swelling of the hands and feet -tremors -unusually weak or tired Side effects that usually do not require medical attention (report to your doctor or health care professional if they continue or are bothersome): -constipation or diarrhea -headache This list may not describe all possible side effects. Call your doctor for medical advice about side effects. You may report side effects to FDA at 1-800-FDA-1088. Where should I keep my medicine? This drug is given in a hospital or clinic and will not be stored at home. NOTE: This sheet is a summary. It may not cover all possible information. If you have questions about this medicine, talk to your doctor, pharmacist, or health care provider.    2016, Elsevier/Gold Standard. (2013-06-05 10:38:36) Cisplatin injection What is this medicine? CISPLATIN (SIS pla tin) is a chemotherapy drug. It targets fast dividing cells, like cancer cells, and causes these cells to die. This medicine is used to treat many types of cancer like bladder, ovarian, and testicular cancers. This medicine may be used for other purposes; ask your health care provider or pharmacist if you have questions. What should I tell my health care provider before I take this medicine? They need to know if you have any of these conditions: -blood disorders -hearing problems -kidney disease -recent or ongoing radiation therapy -an unusual or allergic reaction to cisplatin, carboplatin, other chemotherapy, other medicines, foods, dyes, or preservatives -pregnant or trying to get  pregnant -breast-feeding How should I use this medicine? This drug is given as an infusion into a vein. It is administered in a hospital or clinic by a specially trained health care professional. Talk to your pediatrician regarding the use of this medicine in children. Special care may be needed. Overdosage: If you think you have taken too much of this medicine contact a poison control center or emergency room at once. NOTE: This medicine is only for you. Do not share this medicine with others. What if I miss a dose? It is important not to miss a dose. Call your doctor or health care professional if you are unable to keep an appointment. What may interact with this medicine? -dofetilide -foscarnet -medicines for seizures -medicines to increase blood counts like filgrastim, pegfilgrastim, sargramostim -probenecid -pyridoxine used with altretamine -rituximab -some antibiotics like amikacin, gentamicin, neomycin, polymyxin B, streptomycin, tobramycin -sulfinpyrazone -vaccines -zalcitabine Talk to your doctor or health care professional before taking any of these medicines: -acetaminophen -aspirin -ibuprofen -ketoprofen -naproxen This list may not describe all possible interactions. Give your health care provider a list of all the medicines, herbs, non-prescription drugs, or dietary supplements you use. Also tell them if you smoke, drink alcohol, or use illegal drugs. Some items may interact with your medicine. What should I watch for while using this medicine? Your condition will be monitored carefully while you are receiving this medicine. You will need important blood work done while you are taking this medicine. This drug may make you feel generally unwell. This  is not uncommon, as chemotherapy can affect healthy cells as well as cancer cells. Report any side effects. Continue your course of treatment even though you feel ill unless your doctor tells you to stop. In some cases, you may  be given additional medicines to help with side effects. Follow all directions for their use. Call your doctor or health care professional for advice if you get a fever, chills or sore throat, or other symptoms of a cold or flu. Do not treat yourself. This drug decreases your body's ability to fight infections. Try to avoid being around people who are sick. This medicine may increase your risk to bruise or bleed. Call your doctor or health care professional if you notice any unusual bleeding. Be careful brushing and flossing your teeth or using a toothpick because you may get an infection or bleed more easily. If you have any dental work done, tell your dentist you are receiving this medicine. Avoid taking products that contain aspirin, acetaminophen, ibuprofen, naproxen, or ketoprofen unless instructed by your doctor. These medicines may hide a fever. Do not become pregnant while taking this medicine. Women should inform their doctor if they wish to become pregnant or think they might be pregnant. There is a potential for serious side effects to an unborn child. Talk to your health care professional or pharmacist for more information. Do not breast-feed an infant while taking this medicine. Drink fluids as directed while you are taking this medicine. This will help protect your kidneys. Call your doctor or health care professional if you get diarrhea. Do not treat yourself. What side effects may I notice from receiving this medicine? Side effects that you should report to your doctor or health care professional as soon as possible: -allergic reactions like skin rash, itching or hives, swelling of the face, lips, or tongue -signs of infection - fever or chills, cough, sore throat, pain or difficulty passing urine -signs of decreased platelets or bleeding - bruising, pinpoint red spots on the skin, black, tarry stools, nosebleeds -signs of decreased red blood cells - unusually weak or tired, fainting  spells, lightheadedness -breathing problems -changes in hearing -gout pain -low blood counts - This drug may decrease the number of white blood cells, red blood cells and platelets. You may be at increased risk for infections and bleeding. -nausea and vomiting -pain, swelling, redness or irritation at the injection site -pain, tingling, numbness in the hands or feet -problems with balance, movement -trouble passing urine or change in the amount of urine Side effects that usually do not require medical attention (report to your doctor or health care professional if they continue or are bothersome): -changes in vision -loss of appetite -metallic taste in the mouth or changes in taste This list may not describe all possible side effects. Call your doctor for medical advice about side effects. You may report side effects to FDA at 1-800-FDA-1088. Where should I keep my medicine? This drug is given in a hospital or clinic and will not be stored at home. NOTE: This sheet is a summary. It may not cover all possible information. If you have questions about this medicine, talk to your doctor, pharmacist, or health care provider.    2016, Elsevier/Gold Standard. (2007-11-04 14:40:54) Gemcitabine injection What is this medicine? GEMCITABINE (jem SIT a been) is a chemotherapy drug. This medicine is used to treat many types of cancer like breast cancer, lung cancer, pancreatic cancer, and ovarian cancer. This medicine may be used for other purposes;  ask your health care provider or pharmacist if you have questions. What should I tell my health care provider before I take this medicine? They need to know if you have any of these conditions: -blood disorders -infection -kidney disease -liver disease -recent or ongoing radiation therapy -an unusual or allergic reaction to gemcitabine, other chemotherapy, other medicines, foods, dyes, or preservatives -pregnant or trying to get  pregnant -breast-feeding How should I use this medicine? This drug is given as an infusion into a vein. It is administered in a hospital or clinic by a specially trained health care professional. Talk to your pediatrician regarding the use of this medicine in children. Special care may be needed. Overdosage: If you think you have taken too much of this medicine contact a poison control center or emergency room at once. NOTE: This medicine is only for you. Do not share this medicine with others. What if I miss a dose? It is important not to miss your dose. Call your doctor or health care professional if you are unable to keep an appointment. What may interact with this medicine? -medicines to increase blood counts like filgrastim, pegfilgrastim, sargramostim -some other chemotherapy drugs like cisplatin -vaccines Talk to your doctor or health care professional before taking any of these medicines: -acetaminophen -aspirin -ibuprofen -ketoprofen -naproxen This list may not describe all possible interactions. Give your health care provider a list of all the medicines, herbs, non-prescription drugs, or dietary supplements you use. Also tell them if you smoke, drink alcohol, or use illegal drugs. Some items may interact with your medicine. What should I watch for while using this medicine? Visit your doctor for checks on your progress. This drug may make you feel generally unwell. This is not uncommon, as chemotherapy can affect healthy cells as well as cancer cells. Report any side effects. Continue your course of treatment even though you feel ill unless your doctor tells you to stop. In some cases, you may be given additional medicines to help with side effects. Follow all directions for their use. Call your doctor or health care professional for advice if you get a fever, chills or sore throat, or other symptoms of a cold or flu. Do not treat yourself. This drug decreases your body's ability to  fight infections. Try to avoid being around people who are sick. This medicine may increase your risk to bruise or bleed. Call your doctor or health care professional if you notice any unusual bleeding. Be careful brushing and flossing your teeth or using a toothpick because you may get an infection or bleed more easily. If you have any dental work done, tell your dentist you are receiving this medicine. Avoid taking products that contain aspirin, acetaminophen, ibuprofen, naproxen, or ketoprofen unless instructed by your doctor. These medicines may hide a fever. Women should inform their doctor if they wish to become pregnant or think they might be pregnant. There is a potential for serious side effects to an unborn child. Talk to your health care professional or pharmacist for more information. Do not breast-feed an infant while taking this medicine. What side effects may I notice from receiving this medicine? Side effects that you should report to your doctor or health care professional as soon as possible: -allergic reactions like skin rash, itching or hives, swelling of the face, lips, or tongue -low blood counts - this medicine may decrease the number of white blood cells, red blood cells and platelets. You may be at increased risk for infections and  bleeding. -signs of infection - fever or chills, cough, sore throat, pain or difficulty passing urine -signs of decreased platelets or bleeding - bruising, pinpoint red spots on the skin, black, tarry stools, blood in the urine -signs of decreased red blood cells - unusually weak or tired, fainting spells, lightheadedness -breathing problems -chest pain -mouth sores -nausea and vomiting -pain, swelling, redness at site where injected -pain, tingling, numbness in the hands or feet -stomach pain -swelling of ankles, feet, hands -unusual bleeding Side effects that usually do not require medical attention (report to your doctor or health care  professional if they continue or are bothersome): -constipation -diarrhea -hair loss -loss of appetite -stomach upset This list may not describe all possible side effects. Call your doctor for medical advice about side effects. You may report side effects to FDA at 1-800-FDA-1088. Where should I keep my medicine? This drug is given in a hospital or clinic and will not be stored at home. NOTE: This sheet is a summary. It may not cover all possible information. If you have questions about this medicine, talk to your doctor, pharmacist, or health care provider.    2016, Elsevier/Gold Standard. (2007-12-09 18:45:54)

## 2016-04-26 ENCOUNTER — Telehealth: Payer: Self-pay | Admitting: *Deleted

## 2016-04-26 NOTE — Telephone Encounter (Signed)
First time chemotherapy follow up phone call. Patient stated,"I did excellent over night. No problems with sickness, pain, sleep. I'm eating and drinking fine. No nausea." Instructed patient to call [facility] if he had any questions or concerns.

## 2016-04-26 NOTE — Telephone Encounter (Addendum)
PA for zolpidem ER 12.5 was denied because pt has to have tried and failed (with documented dates) ALL of the following: zaleplon and eszopiclone. I sent info that he failed IR zolpidem. Do you want to Rx one of these to try?  PA for Oxycontin was also denied because pt must have done both of following: Hx of failure to [facility] ER (duration and date must be supplies) AND must have failure to one of the following:Nucynta ER, Morphine sulfate CR (generic MS Contin) or Fentanyl transdermal. Dr Brigitte Pulse, do you want to try any of these?  Notified pharm of both denials.

## 2016-04-26 NOTE — Telephone Encounter (Signed)
-----   Message from Paulla Dolly, RN sent at 04/25/2016  4:33 PM EDT ----- Regarding: first chemo Dr Orlean [id number] First chemo.  Dr Hazeline Junker pt

## 2016-04-27 MED ORDER — ESZOPICLONE 3 MG PO TABS: 3.0000 mg | ORAL_TABLET | Freq: Every day | ORAL | 0 refills | Status: DC

## 2016-04-27 MED ORDER — MORPHINE SULFATE ER 15 MG PO TBCR: 15.0000 mg | EXTENDED_RELEASE_TABLET | Freq: Two times a day (BID) | ORAL | 0 refills | Status: DC

## 2016-04-27 NOTE — Telephone Encounter (Signed)
Thanks for the info.  I have printed pt out rxs for Lunesta for sleep and MS Contin (which is safer than oxycontin) twice a day for pain control.

## 2016-04-27 NOTE — Telephone Encounter (Signed)
Spoke with patient regarding his prescriptions are ready for pickup.

## 2016-05-01 ENCOUNTER — Ambulatory Visit (HOSPITAL_BASED_OUTPATIENT_CLINIC_OR_DEPARTMENT_OTHER): Payer: 59 | Admitting: Oncology

## 2016-05-01 ENCOUNTER — Other Ambulatory Visit (HOSPITAL_BASED_OUTPATIENT_CLINIC_OR_DEPARTMENT_OTHER): Payer: 59

## 2016-05-01 ENCOUNTER — Ambulatory Visit (HOSPITAL_BASED_OUTPATIENT_CLINIC_OR_DEPARTMENT_OTHER): Payer: 59

## 2016-05-01 VITALS — BP 132/87 | HR 76 | Temp 98.4°F | Resp 18 | Ht 70.0 in | Wt 228.7 lb

## 2016-05-01 DIAGNOSIS — C675 Malignant neoplasm of bladder neck: Secondary | ICD-10-CM

## 2016-05-01 DIAGNOSIS — C679 Malignant neoplasm of bladder, unspecified: Secondary | ICD-10-CM

## 2016-05-01 DIAGNOSIS — Z8551 Personal history of malignant neoplasm of bladder: Secondary | ICD-10-CM

## 2016-05-01 DIAGNOSIS — Z5111 Encounter for antineoplastic chemotherapy: Secondary | ICD-10-CM | POA: Diagnosis not present

## 2016-05-01 LAB — COMPREHENSIVE METABOLIC PANEL
ALBUMIN: 3.7 g/dL (ref 3.5–5.0)
ALK PHOS: 134 U/L (ref 40–150)
ALT: 18 U/L (ref 0–55)
ANION GAP: 14 meq/L — AB (ref 3–11)
AST: 22 U/L (ref 5–34)
BILIRUBIN TOTAL: 0.91 mg/dL (ref 0.20–1.20)
BUN: 21.8 mg/dL (ref 7.0–26.0)
CALCIUM: 10 mg/dL (ref 8.4–10.4)
CO2: 24 meq/L (ref 22–29)
CREATININE: 1.3 mg/dL (ref 0.7–1.3)
Chloride: 94 mEq/L — ABNORMAL LOW (ref 98–109)
EGFR: 59 mL/min/{1.73_m2} — ABNORMAL LOW (ref >90)
Glucose: 258 mg/dl — ABNORMAL HIGH (ref 70–140)
Potassium: 4 mEq/L (ref 3.5–5.1)
Sodium: 132 mEq/L — ABNORMAL LOW (ref 136–145)
TOTAL PROTEIN: 7.9 g/dL (ref 6.4–8.3)

## 2016-05-01 LAB — CBC WITH DIFFERENTIAL/PLATELET
BASO%: 0.9 % (ref 0.0–2.0)
Basophils Absolute: 0 10*3/uL (ref 0.0–0.1)
EOS ABS: 0 10*3/uL (ref 0.0–0.5)
EOS%: 0.9 % (ref 0.0–7.0)
HEMATOCRIT: 46.4 % (ref 38.4–49.9)
HEMOGLOBIN: 15.4 g/dL (ref 13.0–17.1)
LYMPH#: 2.6 10*3/uL (ref 0.9–3.3)
LYMPH%: 49.9 % — ABNORMAL HIGH (ref 14.0–49.0)
MCH: 28.1 pg (ref 27.2–33.4)
MCHC: 33.1 g/dL (ref 32.0–36.0)
MCV: 84.9 fL (ref 79.3–98.0)
MONO#: 0.1 10*3/uL (ref 0.1–0.9)
MONO%: 1.8 % (ref 0.0–14.0)
NEUT%: 46.5 % (ref 39.0–75.0)
NEUTROS ABS: 2.4 10*3/uL (ref 1.5–6.5)
PLATELETS: 215 10*3/uL (ref 140–400)
RBC: 5.47 10*6/uL (ref 4.20–5.82)
RDW: 13.6 % (ref 11.0–14.6)
WBC: 5.2 10*3/uL (ref 4.0–10.3)

## 2016-05-01 MED ORDER — PROCHLORPERAZINE MALEATE 10 MG PO TABS
ORAL_TABLET | ORAL | Status: AC
  Filled 2016-05-01: qty 1

## 2016-05-01 MED ORDER — SODIUM CHLORIDE 0.9 % IV SOLN
Freq: Once | INTRAVENOUS | Status: AC
  Administered 2016-05-01: 11:00:00 via INTRAVENOUS

## 2016-05-01 MED ORDER — SODIUM CHLORIDE 0.9 % IV SOLN
1000.0000 mg/m2 | Freq: Once | INTRAVENOUS | Status: AC
  Administered 2016-05-01: 2318 mg via INTRAVENOUS
  Filled 2016-05-01: qty 60.96

## 2016-05-01 MED ORDER — SODIUM CHLORIDE 0.9% FLUSH
10.0000 mL | INTRAVENOUS | Status: DC | PRN
  Administered 2016-05-01: 10 mL
  Filled 2016-05-01: qty 10

## 2016-05-01 MED ORDER — PROCHLORPERAZINE MALEATE 10 MG PO TABS: 10.0000 mg | ORAL_TABLET | Freq: Once | ORAL | Status: DC

## 2016-05-01 MED ORDER — HEPARIN SOD (PORK) LOCK FLUSH 100 UNIT/ML IV SOLN
500.0000 [IU] | Freq: Once | INTRAVENOUS | Status: AC | PRN
  Administered 2016-05-01: 500 [IU]
  Filled 2016-05-01: qty 5

## 2016-05-01 NOTE — Progress Notes (Signed)
Hematology and Oncology Follow Up Visit  HANSFORD JEMISON [id number] 13-Oct-1955 60 y.o. 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   Prior Therapy: He underwent TURBT on 03/30/2016 as well as TURP and the left bladder wall biopsy.   Current therapy: He is currently receiving neoadjuvant chemotherapy utilizing cisplatin and gemcitabine. Therapy started on 04/24/2016. Today is day 8 of cycle 1 of therapy.  Interim History: Mr. Kleiser presents today for a follow-up visit. Since the last visit, he received day 1 of cycle 1 of therapy without any major complications. He denied any nausea, vomiting or abdominal pain. He did report some constipation. He does not report any peripheral neuropathy. He did not have any infusion-related complications. He denied any hearing deficits or electrolyte imbalances. He did report grade 1 fatigue but otherwise tolerated chemotherapy well.  He does not report any headaches, blurry vision, syncope or seizures. He does not report any fevers, chills, sweats. He does not report any chest pain, palpitation, orthopnea or leg edema. He does not report any cough, wheezing or hemoptysis. He does not report any nausea, vomiting, abdominal pain, gross satiety, constipation or diarrhea. He does not report any frequency, urgency or hesitancy. He denied dysuria or hematuria. He does not report any lymphadenopathy or petechiae. He does not report any skeletal complaints. Remaining review of systems unremarkable  Medications: I have reviewed the patient's current medications.  Current Outpatient Prescriptions  Medication Sig Dispense Refill  . amitriptyline (ELAVIL) 25 MG tablet Take 1 tablet (25 mg total) by mouth at bedtime. 30 tablet 1  . atorvastatin (LIPITOR) 40 MG tablet Take 1 tablet (40 mg  total) by mouth daily. 90 tablet 1  . docusate sodium (COLACE) 100 MG capsule Take 1 capsule (100 mg total) by mouth 2 (two) times daily as needed for mild constipation. 60 capsule 0  . Eszopiclone 3 MG TABS Take 1 tablet (3 mg total) by mouth at bedtime. Take immediately before bedtime 30 tablet 0  . glipiZIDE (GLUCOTROL) 5 MG tablet Take 1 tablet (5 mg total) by mouth 2 (two) times daily before a meal. 180 tablet 3  . lidocaine-prilocaine (EMLA) cream Apply to port before chemotherapy. 30 g 0  . lisinopril (PRINIVIL,ZESTRIL) 20 MG tablet Take 1 tablet (20 mg total) by mouth daily. 90 tablet 1  . metFORMIN (GLUCOPHAGE) 1000 MG tablet Take 1 tablet (1,000 mg total) by mouth 2 (two) times daily with a meal. 180 tablet 0  . metoprolol (LOPRESSOR) 50 MG tablet Take 1/2 tab PO BID (Patient taking differently: Take 25 mg by mouth 2 (two) times daily. ) 90 tablet 1  . morphine (MS CONTIN) 15 MG 12 hr tablet Take 1 tablet (15 mg total) by mouth every 12 (twelve) hours. 60 tablet 0  . Omega-3 Fatty Acids (FISH OIL) 300 MG CAPS Take 600 capsules by mouth 2 (two) times daily.     Marland Kitchen omeprazole (PRILOSEC) 40 MG capsule TAKE 1 CAPSULE BY MOUTH EVERY DAY 90 capsule 1  . oxybutynin (DITROPAN) 5 MG tablet Take 1 tablet (5 mg total) by mouth every 8 (eight) hours as needed for bladder spasms. 30 tablet 1  . oxyCODONE-acetaminophen (ROXICET) 5-325 MG tablet Take 1-2 tablets by mouth every 4 (four) hours as needed for severe pain. 120 tablet 0  . polyethylene glycol powder (GLYCOLAX/MIRALAX) powder Take 17  g by mouth 4 (four) times daily as needed. 500 g 11  . prochlorperazine (COMPAZINE) 10 MG tablet Take 1 tablet (10 mg total) by mouth every 6 (six) hours as needed for nausea or vomiting. 30 tablet 0  . promethazine (PHENERGAN) 25 MG tablet TK 1 T PO Q 6 H PRF NAUSEA  1  . zolpidem (AMBIEN) 10 MG tablet TK 1 T PO QHS PRF SLP  0   No current facility-administered medications for this visit.      Allergies: No  Known Allergies  Past Medical History, Surgical history, Social history, and Family History were reviewed and updated.   Physical Exam: Blood pressure 132/87, pulse 76, temperature 98.4 F (36.9 C), temperature source Oral, resp. rate 18, height 5\' 10"  (1.778 m), weight 228 lb 11.2 oz (103.7 kg), SpO2 100 %. ECOG: 0 General appearance: alert and cooperative Head: Normocephalic, without obvious abnormality Neck: no adenopathy Lymph nodes: Cervical, supraclavicular, and axillary nodes normal. Heart:regular rate and rhythm, S1, S2 normal, no murmur, click, rub or gallop Lung:chest clear, no wheezing, rales, normal symmetric air entry Abdomin: soft, non-tender, without masses or organomegaly EXT:no erythema, induration, or nodules   Lab Results: Lab Results  Component Value Date   WBC 5.2 05/01/2016   HGB 15.4 05/01/2016   HCT 46.4 05/01/2016   MCV 84.9 05/01/2016   PLT 215 05/01/2016     Chemistry      Component Value Date/Time   NA 132 (L) 05/01/2016 0941   K 4.0 05/01/2016 0941   CL 101 04/19/2016 1245   CO2 24 05/01/2016 0941   BUN 21.8 05/01/2016 0941   CREATININE 1.3 05/01/2016 0941      Component Value Date/Time   CALCIUM 10.0 05/01/2016 0941   ALKPHOS 134 05/01/2016 0941   AST 22 05/01/2016 0941   ALT 18 05/01/2016 0941   BILITOT 0.91 05/01/2016 0941         Impression and Plan:  60 year old gentleman with the following issues:  1. Urothelial carcinoma arising from the bladder neck presented with a tumor measuring 1.5 cm with a previous history of superficial bladder tumors. His tumor invades into the prostatic stroma without any evidence of local lymphadenopathy or distantl metastasis. His pathological staging was T4a, NX.  He is receiving neoadjuvant chemotherapy utilizing cisplatin and gemcitabine. He tolerated day 1 of cycle 1 of therapy and ready to proceed with day 8.  The plan is to complete 4 cycles of therapy which will conclude along the last  week of November. Following that, radical cystectomy would be his best curative option.  2. IV access:  Port-A-Cath inserted without complications. EMLA cream is available to the patient.  3. Antiemetics: Compazine is available to the patient. He had little nausea and vomiting noted.  4. Renal function surveillance: His kidney function continue to be within normal range.  5. DVT prophylaxis: He has increased risk of developing thrombosis and discussed recommendations to decrease his risk which include ambulation and periodic stretching.  Follow-up: Will be in 2 weeks to start cycle 2 of therapy.   [facility], MD 9/19/201710:28 AM

## 2016-05-01 NOTE — Patient Instructions (Signed)
[facility] Discharge Instructions for Patients Receiving Chemotherapy  Today you received the following chemotherapy agents Gemzar  To help prevent nausea and vomiting after your treatment, we encourage you to take your nausea medication as directed.    If you develop nausea and vomiting that is not controlled by your nausea medication, call the clinic.   BELOW ARE SYMPTOMS THAT SHOULD BE REPORTED IMMEDIATELY:  *FEVER GREATER THAN 100.5 F  *CHILLS WITH OR WITHOUT FEVER  NAUSEA AND VOMITING THAT IS NOT CONTROLLED WITH YOUR NAUSEA MEDICATION  *UNUSUAL SHORTNESS OF BREATH  *UNUSUAL BRUISING OR BLEEDING  TENDERNESS IN MOUTH AND THROAT WITH OR WITHOUT PRESENCE OF ULCERS  *URINARY PROBLEMS  *BOWEL PROBLEMS  UNUSUAL RASH Items with * indicate a potential emergency and should be followed up as soon as possible.  Feel free to call the clinic you have any questions or concerns. The clinic phone number is [phone].  Please show the CHEMO ALERT CARD at check-in to the Emergency Department and triage nurse.   

## 2016-05-02 ENCOUNTER — Telehealth: Payer: Self-pay

## 2016-05-02 ENCOUNTER — Ambulatory Visit: Payer: 59

## 2016-05-02 NOTE — Telephone Encounter (Signed)
PATIENT'S WIFE (Alexander Wolf) STATES DR. [name] PRESCRIBED HIM ZOLPIDEM 10 MG, BUT RECENTLY CHANGED IT TO GENERIC LUNESTA. THE LUNESTA CAUSES A TERRIBLE TASTE IN HIS MOUTH. HE WOULD LIKE TO GO BACK TO ZOLPIDEM. BEST PHONE [phone] (CELL-IF HE DOES NOT ANSWER PLEASE LEAVE A DETAILED MESSAGE) PHARMACY CHOICE IS [facility] ON [address] AND [address] [place]

## 2016-05-03 ENCOUNTER — Telehealth: Payer: Self-pay

## 2016-05-03 MED ORDER — ZALEPLON 10 MG PO CAPS: 10.0000 mg | ORAL_CAPSULE | Freq: Every evening | ORAL | 0 refills | Status: DC | PRN

## 2016-05-03 NOTE — Telephone Encounter (Signed)
No problem. However, the reason we changed it was because on the zolpidem he was waking after a few hours and to get the extended release higher dose zolpidem he has to fail Lunesta (which he has now done) and [employer].  I have printed out a Sonata rx for him to try.  If that doesn't work or he has side effects, then his insurance WILL cover the ER zolpidem.  Can fax in [place] rx if this is ok with pt.

## 2016-05-03 NOTE — Telephone Encounter (Signed)
fmla paperwork for wife, Alexander Wolf, handed to pt in infusion 04/25/16

## 2016-05-03 NOTE — Telephone Encounter (Signed)
This has already been completed by ph messages.

## 2016-05-07 ENCOUNTER — Telehealth: Payer: Self-pay

## 2016-05-07 NOTE — Telephone Encounter (Signed)
Sage left to call back.

## 2016-05-07 NOTE — Telephone Encounter (Signed)
Ok, if he doesn't like it and wants to try the [employer], call.

## 2016-05-07 NOTE — Telephone Encounter (Signed)
Spoke with pt regarding Sonata. First two nights no sleep and then last night a good nights sleep/

## 2016-05-09 ENCOUNTER — Telehealth: Payer: Self-pay | Admitting: Oncology

## 2016-05-09 NOTE — Telephone Encounter (Signed)
spke with patient re next appointment for 10/3

## 2016-05-11 ENCOUNTER — Encounter: Payer: Self-pay | Admitting: Oncology

## 2016-05-14 ENCOUNTER — Telehealth: Payer: Self-pay

## 2016-05-14 NOTE — Telephone Encounter (Signed)
Please f/u

## 2016-05-14 NOTE — Telephone Encounter (Signed)
PATIENT'S WIFE (ELIZABETH) STATES SHE REALLY NEEDS TO GET A REFILL ON HER HUSBAND'S PERCOCET 5-325 MG. DR. Alen Blew IS HIS CANCER DOCTOR, BUT HE IS NOT IN THE OFFICE TODAY AND HE ONLY HAS 4 PILLS LEFT. DR. Brigitte Pulse IS HIS PCP. HE IS IN A LOT OF PAIN. HE TAKES THIS PERCOCET FOR HIS BREAK THRU TUMOR CANCER PAIN. (I DID EXPLAIN OUT REFILL POLICY TO HER). BEST PHONE [phone] ([place]) [place]

## 2016-05-15 ENCOUNTER — Ambulatory Visit (HOSPITAL_BASED_OUTPATIENT_CLINIC_OR_DEPARTMENT_OTHER): Payer: 59

## 2016-05-15 ENCOUNTER — Ambulatory Visit (HOSPITAL_BASED_OUTPATIENT_CLINIC_OR_DEPARTMENT_OTHER): Payer: 59 | Admitting: Oncology

## 2016-05-15 ENCOUNTER — Other Ambulatory Visit (HOSPITAL_BASED_OUTPATIENT_CLINIC_OR_DEPARTMENT_OTHER): Payer: 59

## 2016-05-15 ENCOUNTER — Telehealth: Payer: Self-pay | Admitting: Oncology

## 2016-05-15 ENCOUNTER — Ambulatory Visit: Payer: 59

## 2016-05-15 VITALS — BP 133/62 | HR 68 | Temp 97.6°F | Resp 18 | Ht 70.0 in | Wt 227.5 lb

## 2016-05-15 DIAGNOSIS — C679 Malignant neoplasm of bladder, unspecified: Secondary | ICD-10-CM

## 2016-05-15 DIAGNOSIS — C675 Malignant neoplasm of bladder neck: Secondary | ICD-10-CM

## 2016-05-15 DIAGNOSIS — Z5111 Encounter for antineoplastic chemotherapy: Secondary | ICD-10-CM | POA: Diagnosis not present

## 2016-05-15 DIAGNOSIS — Z95828 Presence of other vascular implants and grafts: Secondary | ICD-10-CM

## 2016-05-15 DIAGNOSIS — Z8551 Personal history of malignant neoplasm of bladder: Secondary | ICD-10-CM

## 2016-05-15 LAB — CBC WITH DIFFERENTIAL/PLATELET
BASO%: 0.2 % (ref 0.0–2.0)
Basophils Absolute: 0 10*3/uL (ref 0.0–0.1)
EOS%: 0.6 % (ref 0.0–7.0)
Eosinophils Absolute: 0.1 10*3/uL (ref 0.0–0.5)
HEMATOCRIT: 37.6 % — AB (ref 38.4–49.9)
HEMOGLOBIN: 12.6 g/dL — AB (ref 13.0–17.1)
LYMPH#: 3.3 10*3/uL (ref 0.9–3.3)
LYMPH%: 40.6 % (ref 14.0–49.0)
MCH: 28.3 pg (ref 27.2–33.4)
MCHC: 33.5 g/dL (ref 32.0–36.0)
MCV: 84.3 fL (ref 79.3–98.0)
MONO#: 1.2 10*3/uL — ABNORMAL HIGH (ref 0.1–0.9)
MONO%: 14.5 % — ABNORMAL HIGH (ref 0.0–14.0)
NEUT#: 3.5 10*3/uL (ref 1.5–6.5)
NEUT%: 44.1 % (ref 39.0–75.0)
Platelets: 665 10*3/uL — ABNORMAL HIGH (ref 140–400)
RBC: 4.46 10*6/uL (ref 4.20–5.82)
RDW: 13.5 % (ref 11.0–14.6)
WBC: 8 10*3/uL (ref 4.0–10.3)

## 2016-05-15 LAB — COMPREHENSIVE METABOLIC PANEL
ALBUMIN: 3.5 g/dL (ref 3.5–5.0)
ALT: 13 U/L (ref 0–55)
AST: 21 U/L (ref 5–34)
Alkaline Phosphatase: 131 U/L (ref 40–150)
Anion Gap: 12 mEq/L — ABNORMAL HIGH (ref 3–11)
BUN: 15 mg/dL (ref 7.0–26.0)
CALCIUM: 9.8 mg/dL (ref 8.4–10.4)
CHLORIDE: 102 meq/L (ref 98–109)
CO2: 23 mEq/L (ref 22–29)
CREATININE: 1.1 mg/dL (ref 0.7–1.3)
EGFR: 72 mL/min/{1.73_m2} — ABNORMAL LOW (ref >90)
GLUCOSE: 125 mg/dL (ref 70–140)
Potassium: 4.6 mEq/L (ref 3.5–5.1)
Sodium: 137 mEq/L (ref 136–145)
Total Bilirubin: <0.3 mg/dL (ref 0.20–1.20)
Total Protein: 7.6 g/dL (ref 6.4–8.3)

## 2016-05-15 MED ORDER — MORPHINE SULFATE 4 MG/ML IJ SOLN
2.0000 mg | Freq: Once | INTRAMUSCULAR | Status: AC
  Administered 2016-05-15: 2 mg via INTRAVENOUS
  Filled 2016-05-15: qty 1

## 2016-05-15 MED ORDER — PALONOSETRON HCL INJECTION 0.25 MG/5ML
INTRAVENOUS | Status: AC
  Filled 2016-05-15: qty 5

## 2016-05-15 MED ORDER — HEPARIN SOD (PORK) LOCK FLUSH 100 UNIT/ML IV SOLN
500.0000 [IU] | Freq: Once | INTRAVENOUS | Status: AC | PRN
  Administered 2016-05-15: 500 [IU]
  Filled 2016-05-15: qty 5

## 2016-05-15 MED ORDER — OXYCODONE-ACETAMINOPHEN 5-325 MG PO TABS: 1.0000 | ORAL_TABLET | ORAL | 0 refills | Status: DC | PRN

## 2016-05-15 MED ORDER — SODIUM CHLORIDE 0.9% FLUSH
10.0000 mL | INTRAVENOUS | Status: DC | PRN
  Administered 2016-05-15: 10 mL via INTRAVENOUS
  Filled 2016-05-15: qty 10

## 2016-05-15 MED ORDER — MORPHINE SULFATE (PF) 4 MG/ML IV SOLN
INTRAVENOUS | Status: AC
  Filled 2016-05-15: qty 1

## 2016-05-15 MED ORDER — OXYCODONE-ACETAMINOPHEN 5-325 MG PO TABS
2.0000 | ORAL_TABLET | Freq: Once | ORAL | Status: AC
  Administered 2016-05-15: 2 via ORAL

## 2016-05-15 MED ORDER — OXYCODONE-ACETAMINOPHEN 5-325 MG PO TABS
ORAL_TABLET | ORAL | Status: AC
  Filled 2016-05-15: qty 2

## 2016-05-15 MED ORDER — POTASSIUM CHLORIDE 2 MEQ/ML IV SOLN
Freq: Once | INTRAVENOUS | Status: AC
  Administered 2016-05-15: 09:00:00 via INTRAVENOUS
  Filled 2016-05-15: qty 10

## 2016-05-15 MED ORDER — PALONOSETRON HCL INJECTION 0.25 MG/5ML
0.2500 mg | Freq: Once | INTRAVENOUS | Status: AC
  Administered 2016-05-15: 0.25 mg via INTRAVENOUS

## 2016-05-15 MED ORDER — SODIUM CHLORIDE 0.9% FLUSH
10.0000 mL | INTRAVENOUS | Status: DC | PRN
  Administered 2016-05-15: 10 mL
  Filled 2016-05-15: qty 10

## 2016-05-15 MED ORDER — SODIUM CHLORIDE 0.9 % IV SOLN
Freq: Once | INTRAVENOUS | Status: AC
  Administered 2016-05-15: 12:00:00 via INTRAVENOUS
  Filled 2016-05-15: qty 5

## 2016-05-15 MED ORDER — SODIUM CHLORIDE 0.9 % IV SOLN
1000.0000 mg/m2 | Freq: Once | INTRAVENOUS | Status: AC
  Administered 2016-05-15: 2318 mg via INTRAVENOUS
  Filled 2016-05-15: qty 60.96

## 2016-05-15 MED ORDER — SODIUM CHLORIDE 0.9 % IV SOLN
Freq: Once | INTRAVENOUS | Status: AC
  Administered 2016-05-15: 09:00:00 via INTRAVENOUS

## 2016-05-15 MED ORDER — SODIUM CHLORIDE 0.9 % IV SOLN
65.0000 mg/m2 | Freq: Once | INTRAVENOUS | Status: AC
  Administered 2016-05-15: 150 mg via INTRAVENOUS
  Filled 2016-05-15: qty 150

## 2016-05-15 MED FILL — OXYCODONE/APAP 5/325 MG TAB: 5-325 | 10 days supply | Qty: 120 | Fill #0

## 2016-05-15 NOTE — Progress Notes (Signed)
Hematology and Oncology Follow Up Visit  Alexander Wolf [id number] 1955-11-10 60 y.o. 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   Prior Therapy: He underwent TURBT on 03/30/2016 as well as TURP and the left bladder wall biopsy.   Current therapy: He is currently receiving neoadjuvant chemotherapy utilizing cisplatin and gemcitabine. Therapy started on 04/24/2016. He is here to start cycle 2 of therapy.  Interim History: [name] presents today for a follow-up visit. Since the last visit, he continues to tolerate chemotherapy without major complications.  He denied any nausea, vomiting or abdominal pain. He does not report any peripheral neuropathy. He did not have any infusion-related complications. He denied any hearing deficits or electrolyte imbalances.   He continues to have issues with pain and constipation. His pain is predominantly pelvic and lower abdominal. He also reports issues with constipation related to his pain medication. He takes morphine twice a day as well as Percocet periodically ranging from 1-3 day. His pain is reasonably managed also at times he does not take morphine which makes him utilized more Percocet at times.  He does not report any headaches, blurry vision, syncope or seizures. He does not report any fevers, chills, sweats. He does not report any chest pain, palpitation, orthopnea or leg edema. He does not report any cough, wheezing or hemoptysis. He does not report any nausea, vomiting, abdominal pain, gross satiety, constipation or diarrhea. He does not report any frequency, urgency or hesitancy. He denied dysuria or hematuria. He does not report any lymphadenopathy or petechiae. He does not report any skeletal complaints. Remaining review of systems unremarkable  Medications: I  have reviewed the patient's current medications.  Current Outpatient Prescriptions  Medication Sig Dispense Refill  . amitriptyline (ELAVIL) 25 MG tablet Take 1 tablet (25 mg total) by mouth at bedtime. 30 tablet 1  . atorvastatin (LIPITOR) 40 MG tablet Take 1 tablet (40 mg total) by mouth daily. 90 tablet 1  . docusate sodium (COLACE) 100 MG capsule Take 1 capsule (100 mg total) by mouth 2 (two) times daily as needed for mild constipation. 60 capsule 0  . Eszopiclone 3 MG TABS TK 1 T PO D IMM B BED  0  . glipiZIDE (GLUCOTROL) 5 MG tablet Take 1 tablet (5 mg total) by mouth 2 (two) times daily before a meal. 180 tablet 3  . lidocaine-prilocaine (EMLA) cream Apply to port before chemotherapy. 30 g 0  . lisinopril (PRINIVIL,ZESTRIL) 20 MG tablet Take 1 tablet (20 mg total) by mouth daily. 90 tablet 1  . metFORMIN (GLUCOPHAGE) 1000 MG tablet Take 1 tablet (1,000 mg total) by mouth 2 (two) times daily with a meal. 180 tablet 0  . metoprolol (LOPRESSOR) 50 MG tablet Take 1/2 tab PO BID (Patient taking differently: Take 25 mg by mouth 2 (two) times daily. ) 90 tablet 1  . morphine (MS CONTIN) 15 MG 12 hr tablet Take 1 tablet (15 mg total) by mouth every 12 (twelve) hours. 60 tablet 0  . Omega-3 Fatty Acids (FISH OIL) 300 MG CAPS Take 600 capsules by mouth 2 (two) times daily.     Marland Kitchen omeprazole (PRILOSEC) 40 MG capsule TAKE 1 CAPSULE BY MOUTH EVERY DAY 90 capsule 1  . oxybutynin (DITROPAN) 5 MG tablet Take 1 tablet (5 mg total) by mouth every 8 (eight) hours as needed  for bladder spasms. 30 tablet 1  . oxyCODONE-acetaminophen (ROXICET) 5-325 MG tablet Take 1-2 tablets by mouth every 4 (four) hours as needed for severe pain. 120 tablet 0  . polyethylene glycol powder (GLYCOLAX/MIRALAX) powder Take 17 g by mouth 4 (four) times daily as needed. 500 g 11  . prochlorperazine (COMPAZINE) 10 MG tablet Take 1 tablet (10 mg total) by mouth every 6 (six) hours as needed for nausea or vomiting. 30 tablet 0  .  promethazine (PHENERGAN) 25 MG tablet TK 1 T PO Q 6 H PRF NAUSEA  1  . zaleplon (SONATA) 10 MG capsule Take 1 capsule (10 mg total) by mouth at bedtime as needed for sleep. 30 capsule 0  . zolpidem (AMBIEN) 10 MG tablet TK 1 T PO QHS PRF SLP  0   No current facility-administered medications for this visit.      Allergies: No Known Allergies  Past Medical History, Surgical history, Social history, and Family History were reviewed and updated.   Physical Exam: Blood pressure 133/62, pulse 68, temperature 97.6 F (36.4 C), temperature source Oral, resp. rate 18, height 5\' 10"  (1.778 m), weight 227 lb 8 oz (103.2 kg), SpO2 96 %. ECOG: 0 General appearance: alert and cooperative appeared without distress. Head: Normocephalic, without obvious abnormality no oral ulcers or lesions. Neck: no adenopathy Lymph nodes: Cervical, supraclavicular, and axillary nodes normal. Heart:regular rate and rhythm, S1, S2 normal, no murmur, click, rub or gallop Lung:chest clear, no wheezing, rales, normal symmetric air entry Abdomin: soft, non-tender, without masses or organomegaly no rebound or guarding. EXT:no erythema, induration, or nodules   Lab Results: Lab Results  Component Value Date   WBC 8.0 05/15/2016   HGB 12.6 (L) 05/15/2016   HCT 37.6 (L) 05/15/2016   MCV 84.3 05/15/2016   PLT 665 (H) 05/15/2016     Chemistry      Component Value Date/Time   NA 132 (L) 05/01/2016 0941   K 4.0 05/01/2016 0941   CL 101 04/19/2016 1245   CO2 24 05/01/2016 0941   BUN 21.8 05/01/2016 0941   CREATININE 1.3 05/01/2016 0941      Component Value Date/Time   CALCIUM 10.0 05/01/2016 0941   ALKPHOS 134 05/01/2016 0941   AST 22 05/01/2016 0941   ALT 18 05/01/2016 0941   BILITOT 0.91 05/01/2016 0941         Impression and Plan:  60 year old gentleman with the following issues:  1. Urothelial carcinoma arising from the bladder neck presented with a tumor measuring 1.5 cm with a previous history  of superficial bladder tumors. His tumor invades into the prostatic stroma without any evidence of local lymphadenopathy or distantl metastasis. His pathological staging was T4a, NX.  He is receiving neoadjuvant chemotherapy utilizing cisplatin and gemcitabine. He tolerated chemotherapy reasonably well without any major complications. The plan is to proceed with cycle 2 of therapy without any dose reduction or delay. 3-4 cycles of therapy are planned depending on his tolerance.   2. IV access:  Port-A-Cath inserted without complications. EMLA cream is available to the patient.  3. Antiemetics: Compazine is available to the patient. He had little nausea and vomiting noted.  4. Renal function surveillance: His creatinine did increase slightly to 1.3 and this will be repeated today.  5. Pain: Predominantly lower abdominal and pelvic and related to his bladder cancer. Currently on morphine long-acting twice a day with breakthrough Percocet. This was refilled for him today.  6. DVT prophylaxis: He has increased risk  of developing thrombosis and discussed recommendations to decrease his risk which include ambulation and periodic stretching.  7. Follow-up: Will be in one week for day 8 of cycle 2 of therapy.   [id number], MD 10/3/20178:59 AM

## 2016-05-15 NOTE — Patient Instructions (Signed)
[facility] Discharge Instructions for Patients Receiving Chemotherapy  Today you received the following chemotherapy agents:  Gemzar and Cisplatin.  To help prevent nausea and vomiting after your treatment, we encourage you to take your nausea medication as prescribed.   If you develop nausea and vomiting that is not controlled by your nausea medication, call the clinic.   BELOW ARE SYMPTOMS THAT SHOULD BE REPORTED IMMEDIATELY:  *FEVER GREATER THAN 100.5 F  *CHILLS WITH OR WITHOUT FEVER  NAUSEA AND VOMITING THAT IS NOT CONTROLLED WITH YOUR NAUSEA MEDICATION  *UNUSUAL SHORTNESS OF BREATH  *UNUSUAL BRUISING OR BLEEDING  TENDERNESS IN MOUTH AND THROAT WITH OR WITHOUT PRESENCE OF ULCERS  *URINARY PROBLEMS  *BOWEL PROBLEMS  UNUSUAL RASH Items with * indicate a potential emergency and should be followed up as soon as possible.  Feel free to call the clinic you have any questions or concerns. The clinic phone number is [phone].  Please show the [facility] at check-in to the Emergency Department and triage nurse.  A

## 2016-05-15 NOTE — Telephone Encounter (Signed)
No 10/3 los/orders/referrals

## 2016-05-15 NOTE — Addendum Note (Signed)
Encounter addended by: Lesia Hausen, RN on: 05/15/2016  4:18 PM<BR>    Actions taken: Charge Capture section accepted

## 2016-05-16 NOTE — Telephone Encounter (Signed)
Dr. Alen Blew refilled his oxycodone yesterday

## 2016-05-18 DIAGNOSIS — Z0271 Encounter for disability determination: Secondary | ICD-10-CM

## 2016-05-22 ENCOUNTER — Encounter: Payer: Self-pay | Admitting: Oncology

## 2016-05-22 ENCOUNTER — Ambulatory Visit: Payer: 59

## 2016-05-22 ENCOUNTER — Other Ambulatory Visit (HOSPITAL_BASED_OUTPATIENT_CLINIC_OR_DEPARTMENT_OTHER): Payer: 59

## 2016-05-22 ENCOUNTER — Ambulatory Visit (HOSPITAL_BASED_OUTPATIENT_CLINIC_OR_DEPARTMENT_OTHER): Payer: 59

## 2016-05-22 ENCOUNTER — Other Ambulatory Visit: Payer: 59

## 2016-05-22 VITALS — BP 113/89 | HR 68 | Temp 98.0°F | Resp 20

## 2016-05-22 DIAGNOSIS — C675 Malignant neoplasm of bladder neck: Secondary | ICD-10-CM | POA: Diagnosis not present

## 2016-05-22 DIAGNOSIS — Z95828 Presence of other vascular implants and grafts: Secondary | ICD-10-CM

## 2016-05-22 DIAGNOSIS — Z5111 Encounter for antineoplastic chemotherapy: Secondary | ICD-10-CM | POA: Diagnosis not present

## 2016-05-22 DIAGNOSIS — Z8551 Personal history of malignant neoplasm of bladder: Secondary | ICD-10-CM

## 2016-05-22 DIAGNOSIS — C679 Malignant neoplasm of bladder, unspecified: Secondary | ICD-10-CM

## 2016-05-22 LAB — COMPREHENSIVE METABOLIC PANEL
ALBUMIN: 3.5 g/dL (ref 3.5–5.0)
ALK PHOS: 145 U/L (ref 40–150)
ALT: 18 U/L (ref 0–55)
AST: 23 U/L (ref 5–34)
Anion Gap: 13 mEq/L — ABNORMAL HIGH (ref 3–11)
BUN: 24 mg/dL (ref 7.0–26.0)
CO2: 24 mEq/L (ref 22–29)
Calcium: 9.8 mg/dL (ref 8.4–10.4)
Chloride: 98 mEq/L (ref 98–109)
Creatinine: 1.4 mg/dL — ABNORMAL HIGH (ref 0.7–1.3)
EGFR: 53 mL/min/{1.73_m2} — ABNORMAL LOW (ref >90)
Glucose: 219 mg/dl — ABNORMAL HIGH (ref 70–140)
POTASSIUM: 4.2 meq/L (ref 3.5–5.1)
Sodium: 134 mEq/L — ABNORMAL LOW (ref 136–145)
Total Protein: 7.8 g/dL (ref 6.4–8.3)

## 2016-05-22 LAB — CBC WITH DIFFERENTIAL/PLATELET
BASO%: 1.1 % (ref 0.0–2.0)
BASOS ABS: 0.1 10*3/uL (ref 0.0–0.1)
EOS ABS: 0 10*3/uL (ref 0.0–0.5)
EOS%: 0.2 % (ref 0.0–7.0)
HEMATOCRIT: 40.5 % (ref 38.4–49.9)
HEMOGLOBIN: 13.6 g/dL (ref 13.0–17.1)
LYMPH#: 3.2 10*3/uL (ref 0.9–3.3)
LYMPH%: 54.9 % — ABNORMAL HIGH (ref 14.0–49.0)
MCH: 28.1 pg (ref 27.2–33.4)
MCHC: 33.7 g/dL (ref 32.0–36.0)
MCV: 83.4 fL (ref 79.3–98.0)
MONO#: 0.3 10*3/uL (ref 0.1–0.9)
MONO%: 5.2 % (ref 0.0–14.0)
NEUT#: 2.2 10*3/uL (ref 1.5–6.5)
NEUT%: 38.6 % — ABNORMAL LOW (ref 39.0–75.0)
Platelets: 439 10*3/uL — ABNORMAL HIGH (ref 140–400)
RBC: 4.86 10*6/uL (ref 4.20–5.82)
RDW: 13.3 % (ref 11.0–14.6)
WBC: 5.8 10*3/uL (ref 4.0–10.3)

## 2016-05-22 MED ORDER — PROCHLORPERAZINE MALEATE 10 MG PO TABS
10.0000 mg | ORAL_TABLET | Freq: Once | ORAL | Status: AC
  Administered 2016-05-22: 10 mg via ORAL

## 2016-05-22 MED ORDER — SODIUM CHLORIDE 0.9% FLUSH
10.0000 mL | INTRAVENOUS | Status: DC | PRN
  Administered 2016-05-22: 10 mL
  Filled 2016-05-22: qty 10

## 2016-05-22 MED ORDER — ACETAMINOPHEN 325 MG PO TABS
ORAL_TABLET | ORAL | Status: AC
  Filled 2016-05-22: qty 2

## 2016-05-22 MED ORDER — SODIUM CHLORIDE 0.9% FLUSH
10.0000 mL | INTRAVENOUS | Status: DC | PRN
  Administered 2016-05-22: 10 mL via INTRAVENOUS
  Filled 2016-05-22: qty 10

## 2016-05-22 MED ORDER — ACETAMINOPHEN 325 MG PO TABS
650.0000 mg | ORAL_TABLET | Freq: Once | ORAL | Status: AC
  Administered 2016-05-22: 650 mg via ORAL

## 2016-05-22 MED ORDER — HEPARIN SOD (PORK) LOCK FLUSH 100 UNIT/ML IV SOLN
500.0000 [IU] | Freq: Once | INTRAVENOUS | Status: AC | PRN
  Administered 2016-05-22: 500 [IU]
  Filled 2016-05-22: qty 5

## 2016-05-22 MED ORDER — SODIUM CHLORIDE 0.9 % IV SOLN
Freq: Once | INTRAVENOUS | Status: AC
Start: 2016-05-22 — End: 2016-05-22
  Administered 2016-05-22: 13:00:00 via INTRAVENOUS

## 2016-05-22 MED ORDER — PROCHLORPERAZINE MALEATE 10 MG PO TABS
ORAL_TABLET | ORAL | Status: AC
  Filled 2016-05-22: qty 1

## 2016-05-22 MED ORDER — SODIUM CHLORIDE 0.9 % IV SOLN
1000.0000 mg/m2 | Freq: Once | INTRAVENOUS | Status: AC
  Administered 2016-05-22: 2318 mg via INTRAVENOUS
  Filled 2016-05-22: qty 60.96

## 2016-05-22 NOTE — Patient Instructions (Signed)
[facility] Discharge Instructions for Patients Receiving Chemotherapy  Today you received the following chemotherapy agents Gemzar  To help prevent nausea and vomiting after your treatment, we encourage you to take your nausea medication as needed   If you develop nausea and vomiting that is not controlled by your nausea medication, call the clinic.   BELOW ARE SYMPTOMS THAT SHOULD BE REPORTED IMMEDIATELY:  *FEVER GREATER THAN 100.5 F  *CHILLS WITH OR WITHOUT FEVER  NAUSEA AND VOMITING THAT IS NOT CONTROLLED WITH YOUR NAUSEA MEDICATION  *UNUSUAL SHORTNESS OF BREATH  *UNUSUAL BRUISING OR BLEEDING  TENDERNESS IN MOUTH AND THROAT WITH OR WITHOUT PRESENCE OF ULCERS  *URINARY PROBLEMS  *BOWEL PROBLEMS  UNUSUAL RASH Items with * indicate a potential emergency and should be followed up as soon as possible.  Feel free to call the clinic you have any questions or concerns. The clinic phone number is [phone].  Please show the CHEMO ALERT CARD at check-in to the Emergency Department and triage nurse.   

## 2016-05-23 ENCOUNTER — Encounter: Payer: Self-pay | Admitting: *Deleted

## 2016-05-23 ENCOUNTER — Telehealth: Payer: Self-pay | Admitting: *Deleted

## 2016-05-23 NOTE — Telephone Encounter (Signed)
Spoke with patient, he states he did not leave recent e-mail message, it must have been his wife. Informed him that dr Alen Blew would address their concerns at his next appt 06/05/16.

## 2016-05-29 ENCOUNTER — Other Ambulatory Visit: Payer: Self-pay | Admitting: Nurse Practitioner

## 2016-05-29 ENCOUNTER — Telehealth: Payer: Self-pay | Admitting: *Deleted

## 2016-05-29 DIAGNOSIS — C671 Malignant neoplasm of dome of bladder: Secondary | ICD-10-CM

## 2016-05-29 NOTE — Telephone Encounter (Signed)
He can been in symptom management clinic.

## 2016-05-29 NOTE — Telephone Encounter (Signed)
Called Beth to bring  Him in tomorrow morning at 0900 for [facility] visit.

## 2016-05-29 NOTE — Telephone Encounter (Signed)
TC to patient to advise him that he will need to go to radiology in the morning for an abd xray prior to being seen in [facility].  No answer but was able to leave message.

## 2016-05-29 NOTE — Telephone Encounter (Signed)
"  I need to schedule an appointment for [facility].  He keeps calling me in tears due to pain emptying his bowels.  Every time he tries to have a BM, he is in excruciating pain.  Is the tumor pressing on something there?  He uses colace and [employer].  His bowels are moving, had a soft formed BM last night.  Pain is daily every time he tries a BM I've looked down there, no hemorrhoids, protrusions or raw skin so I do not know what is going on.  Uses hemorrhoid preps as a precaution.  He doesn't need more pain pills or laxatives.  Bowels were watery diarrhea so we got him to back off from using laxatives."  Denies abdominal swelling. Bloating, tenderness.  Ask to come in tomorrow.  Call Beth at [phone] with appointment so she can plan to take time off work to get him here.  This nurse instructed her to have him keep his mouth open breathing while trying to move bowels, exhaling when releasing stool to not bear down too hard.

## 2016-05-30 ENCOUNTER — Ambulatory Visit (HOSPITAL_BASED_OUTPATIENT_CLINIC_OR_DEPARTMENT_OTHER): Payer: 59 | Admitting: Nurse Practitioner

## 2016-05-30 ENCOUNTER — Encounter: Payer: Self-pay | Admitting: Nurse Practitioner

## 2016-05-30 VITALS — BP 108/68 | HR 87 | Temp 97.8°F | Resp 18 | Ht 70.0 in | Wt 217.0 lb

## 2016-05-30 DIAGNOSIS — K5900 Constipation, unspecified: Secondary | ICD-10-CM | POA: Diagnosis not present

## 2016-05-30 DIAGNOSIS — E119 Type 2 diabetes mellitus without complications: Secondary | ICD-10-CM

## 2016-05-30 DIAGNOSIS — K5909 Other constipation: Secondary | ICD-10-CM | POA: Insufficient documentation

## 2016-05-30 DIAGNOSIS — C675 Malignant neoplasm of bladder neck: Secondary | ICD-10-CM

## 2016-05-30 DIAGNOSIS — C670 Malignant neoplasm of trigone of bladder: Secondary | ICD-10-CM

## 2016-05-30 MED ORDER — PEG 3350-KCL-NABCB-NACL-NASULF 236 G PO SOLR: 4000.0000 mL | Freq: Once | ORAL | 0 refills | Status: AC

## 2016-05-30 MED ORDER — PEG 3350-KCL-NABCB-NACL-NASULF 236 G PO SOLR: 4000.0000 mL | Freq: Once | ORAL | 0 refills | Status: DC

## 2016-05-30 MED FILL — GAVILYTE-G SOLUTION: 236 | 1 days supply | Qty: 4000 | Fill #0

## 2016-05-30 NOTE — Assessment & Plan Note (Signed)
Patient states that he suffers with chronic constipation secondary to his pain medications.  He confirmed that he has been taking the MS Contin 15 mg twice daily as directed; but typically only uses 1-3 of the Percocet for breakthrough pain and a day's time.  However, he has become significantly constipated recently; and states that he finally cleared out a very large, firm bowel movement on Monday, 05/28/2016.  Patient stated that he had to strain a great deal to get this bowel movement out; and he has been experiencing significant pain to the rectal region.  Since that time.  He denies any internal or external hemorrhoids; also denies any bleeding from the rectum or in his stool.  He states he's had daily.  Soft formed bowel movements since that time.  He continues to take Colace and Corrin Parker.  He states that he has had little appetite recently; and is afraid to eat because this will make you have to go the bathroom more.  He continues to drink plenty of fluids and does not feel dehydrated today.  Patient's wife states the patient has been very uncomfortable and the rectal region.  Since straining to the bathroom earlier this week.  He presented to the [facility] today with significant discomfort to this region.  He was at times unable to sit down or sit still because of the pain.  He was slightly diaphoretic and very anxious as well.  Patient's wife stated that "pt is obsesses with his bowels".    Vital signs are stable and patient was afebrile today.  Patient had 2 of his Percocets from home in his pocket today; and he took both of those Percocets.  Within approximate 5 minutes of taking the Percocets.  Patient appeared much calmer.  Long discussion with the patient regarding the need to continue to use stool softeners and laxatives on an as-needed basis.  To prevent constipation secondary to his narcotic use.  Also, Dr. Alen Blew in to review patient as well.  He was in agreement that this is most  likely pain and discomfort secondary to significant constipation.  He suggested that we try GoLYTELY for a complete bowel cleanout; and then to continue afterwards with both stool softeners and Marilax/laxatives as needed.  Will prescribe GoLYTELY for patient to use as directed.  Patient was advised to call/return or go directly to the emergency department for any worsening symptoms whatsoever.

## 2016-05-30 NOTE — Assessment & Plan Note (Signed)
Patient received cycle 2, day 8 of his cisplatin/gemcitabine chemotherapy regimen on 05/22/2016.  Patient presented to the [facility] today with complaint of chronic constipation and rectal discomfort following a significant bout of constipation.  Patient also states that he has chronically had some hard of hearing issues; but he felt like his right ear was clogged with water.  He states that that is all better; and he has cleared the water now.  He denies any ringing in his ears.  See further notes for details of today's visit.  Patient scheduled to return for labs, flush, visit, and his next cycle of chemotherapy on 06/05/2016.

## 2016-05-30 NOTE — Progress Notes (Signed)
SYMPTOM MANAGEMENT CLINIC    Chief Complaint: Constipation, rectal pain  HPI:  Alexander Wolf 60 y.o. male diagnosed with bladder cancer.  Currently undergoing this plan./Gemcitabine chemotherapy regimen.    No history exists.    Review of Systems  Constitutional: Positive for malaise/fatigue.  HENT: Positive for hearing loss.   Gastrointestinal: Positive for constipation.       Rectal pain with bowel movements.  All other systems reviewed and are negative.   Past Medical History:  Diagnosis Date  . Bladder cancer ([place])    [facility] in [place], [place]; /  urologist in [place]-  dr dahlstedt  . BPH (benign prostatic hypertrophy) with urinary retention   . Coronary artery disease    s/p CABG  (per pt hasn't seen cardiologist for several years)  . Foley catheter in place   . GERD (gastroesophageal reflux disease)   . Hyperlipidemia   . Hypertension   . Insomnia   . 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 Type 2 diabetes mellitus ([place])     Past Surgical History:  Procedure Laterality Date  . CARDIAC CATHETERIZATION  10/26/2003  dr Leonia Reeves   ETT + ischemia/  severe 3 vessel avcad,  normal LVF, ef 55-60%  . CORONARY ARTERY BYPASS GRAFT  10-28-2003   dr Ricard Dillon   LIMA to dLAD,  RIMA to OM1,  SVG to OM2,  SVG to  right posterolateral   . CYSTOSCOPY N/A 03/29/2016   Procedure: CYSTOSCOPY;  Surgeon: Franchot Gallo, MD;  Location: [facility];  Service: Urology;  Laterality: N/A;  . IR GENERIC HISTORICAL  04/19/2016   IR US GUIDE VASC ACCESS RIGHT 04/19/2016 Corrie Mckusick, DO [facility]  . IR GENERIC HISTORICAL  04/19/2016   IR FLUORO GUIDE PORT INSERTION RIGHT 04/19/2016 Corrie Mckusick, DO [facility]  . TONSILLECTOMY  as child  . TRANSURETHRAL RESECTION OF BLADDER TUMOR  2014  . TRANSURETHRAL RESECTION OF BLADDER TUMOR WITH GYRUS (TURBT-GYRUS) N/A 03/29/2016   Procedure: TRANSURETHRAL RESECTION OF BLADDER TUMOR WITH GYRUS (TURBT-GYRUS);  Surgeon: Franchot Gallo, MD;   Location: [facility];  Service: Urology;  Laterality: N/A;  . TRANSURETHRAL RESECTION OF PROSTATE N/A 03/29/2016   Procedure: TRANSURETHRAL RESECTION OF THE PROSTATE (TURP);  Surgeon: Franchot Gallo, MD;  Location: [facility];  Service: Urology;  Laterality: N/A;    has Coronary artery disease involving coronary bypass graft of native heart without angina pectoris; Essential hypertension; Hyperlipidemia LDL goal <70; Type 2 diabetes mellitus not at goal [facility]); Hx of bladder cancer; BPH (benign prostatic hypertrophy) with urinary retention; Nodular prostate with urinary obstruction; Malignant neoplasm of urinary bladder ([place]); and Constipation on his problem list.    has No Known Allergies.    Medication List       Accurate as of 05/30/16 10:59 AM. Always use your most recent med list.          amitriptyline 25 MG tablet Commonly known as:  ELAVIL Take 1 tablet (25 mg total) by mouth at bedtime.   atorvastatin 40 MG tablet Commonly known as:  LIPITOR Take 1 tablet (40 mg total) by mouth daily.   docusate sodium 100 MG capsule Commonly known as:  COLACE Take 1 capsule (100 mg total) by mouth 2 (two) times daily as needed for mild constipation.   Eszopiclone 3 MG Tabs TK 1 T PO D IMM B BED   FISH OIL 300 MG Caps Take 600 capsules by mouth 2 (  two) times daily.   glipiZIDE 5 MG tablet Commonly known as:  GLUCOTROL Take 1 tablet (5 mg total) by mouth 2 (two) times daily before a meal.   lidocaine-prilocaine cream Commonly known as:  EMLA Apply to port before chemotherapy.   lisinopril 20 MG tablet Commonly known as:  PRINIVIL,ZESTRIL Take 1 tablet (20 mg total) by mouth daily.   metFORMIN 1000 MG tablet Commonly known as:  GLUCOPHAGE Take 1 tablet (1,000 mg total) by mouth 2 (two) times daily with a meal.   metoprolol 50 MG tablet Commonly known as:  LOPRESSOR Take 1/2 tab PO BID   morphine 15 MG 12 hr tablet Commonly known as:  MS  CONTIN Take 1 tablet (15 mg total) by mouth every 12 (twelve) hours.   omeprazole 40 MG capsule Commonly known as:  PRILOSEC TAKE 1 CAPSULE BY MOUTH EVERY DAY   oxybutynin 5 MG tablet Commonly known as:  DITROPAN Take 1 tablet (5 mg total) by mouth every 8 (eight) hours as needed for bladder spasms.   oxyCODONE-acetaminophen 5-325 MG tablet Commonly known as:  ROXICET Take 1-2 tablets by mouth every 4 (four) hours as needed for severe pain.   polyethylene glycol 236 g solution Commonly known as:  GAVILYTE-G Take 4,000 mLs by mouth once.   polyethylene glycol powder powder Commonly known as:  GLYCOLAX/MIRALAX Take 17 g by mouth 4 (four) times daily as needed.   prochlorperazine 10 MG tablet Commonly known as:  COMPAZINE Take 1 tablet (10 mg total) by mouth every 6 (six) hours as needed for nausea or vomiting.   promethazine 25 MG tablet Commonly known as:  PHENERGAN TK 1 T PO Q 6 H PRF NAUSEA   zaleplon 10 MG capsule Commonly known as:  SONATA Take 1 capsule (10 mg total) by mouth at bedtime as needed for sleep.   zolpidem 10 MG tablet Commonly known as:  AMBIEN TK 1 T PO QHS PRF SLP        PHYSICAL EXAMINATION  Oncology Vitals 05/30/2016 05/22/2016  Height 178 cm -  Weight 98.431 kg -  Weight (lbs) 217 lbs -  BMI (kg/m2) 31.14 kg/m2 -  Temp 97.8 98  Pulse 87 68  Resp 18 20  SpO2 98 99  BSA (m2) 2.2 m2 -   BP Readings from Last 2 Encounters:  05/30/16 108/68  05/22/16 113/89    Physical Exam  Constitutional: He is oriented to person, place, and time and well-developed, well-nourished, and in no distress.  HENT:  Head: Normocephalic and atraumatic.  Mouth/Throat: Oropharynx is clear and moist.  , No hearing deficits during exam noted.  Actually, patient asked this provider just be quieter; stating that his pain medication makes his hearing more sensitive.  Eyes: Conjunctivae and EOM are normal. Pupils are equal, round, and reactive to light. Right eye  exhibits no discharge. Left eye exhibits no discharge. No scleral icterus.  Neck: Normal range of motion. Neck supple. No JVD present. No tracheal deviation present. No thyromegaly present.  Cardiovascular: Normal rate, regular rhythm, normal heart sounds and intact distal pulses.   Pulmonary/Chest: Effort normal and breath sounds normal. No respiratory distress. He has no wheezes. He has no rales. He exhibits no tenderness.  Abdominal: Soft. Bowel sounds are normal. He exhibits no distension and no mass. There is no tenderness. There is no rebound and no guarding.  Musculoskeletal: Normal range of motion. He exhibits no edema, tenderness or deformity.  Lymphadenopathy:    He has no  cervical adenopathy.  Neurological: He is alert and oriented to person, place, and time. Gait normal.  Skin: Skin is warm and dry. No rash noted. No erythema. No pallor.  Psychiatric:  Patient appeared very anxious.  Nursing note and vitals reviewed.   LABORATORY DATA:. No visits with results within 3 Day(s) from this visit.  Latest known visit with results is:  Appointment on 05/22/2016  Component Date Value Ref Range Status  . WBC 05/22/2016 5.8  4.0 - 10.3 10e3/uL Final  . NEUT# 05/22/2016 2.2  1.5 - 6.5 10e3/uL Final  . HGB 05/22/2016 13.6  13.0 - 17.1 g/dL Final  . HCT 05/22/2016 40.5  38.4 - 49.9 % Final  . Platelets 05/22/2016 439* 140 - 400 10e3/uL Final  . MCV 05/22/2016 83.4  79.3 - 98.0 fL Final  . MCH 05/22/2016 28.1  27.2 - 33.4 pg Final  . MCHC 05/22/2016 33.7  32.0 - 36.0 g/dL Final  . RBC 05/22/2016 4.86  4.20 - 5.82 10e6/uL Final  . RDW 05/22/2016 13.3  11.0 - 14.6 % Final  . lymph# 05/22/2016 3.2  0.9 - 3.3 10e3/uL Final  . MONO# 05/22/2016 0.3  0.1 - 0.9 10e3/uL Final  . Eosinophils Absolute 05/22/2016 0.0  0.0 - 0.5 10e3/uL Final  . Basophils Absolute 05/22/2016 0.1  0.0 - 0.1 10e3/uL Final  . NEUT% 05/22/2016 38.6* 39.0 - 75.0 % Final  . LYMPH% 05/22/2016 54.9* 14.0 - 49.0 % Final    . MONO% 05/22/2016 5.2  0.0 - 14.0 % Final  . EOS% 05/22/2016 0.2  0.0 - 7.0 % Final  . BASO% 05/22/2016 1.1  0.0 - 2.0 % Final  . Sodium 05/22/2016 134* 136 - 145 mEq/L Final  . Potassium 05/22/2016 4.2  3.5 - 5.1 mEq/L Final  . Chloride 05/22/2016 98  98 - 109 mEq/L Final  . CO2 05/22/2016 24  22 - 29 mEq/L Final  . Glucose 05/22/2016 219* 70 - 140 mg/dl Final  . BUN 05/22/2016 24.0  7.0 - 26.0 mg/dL Final  . Creatinine 05/22/2016 1.4* 0.7 - 1.3 mg/dL Final  . Total Bilirubin 05/22/2016 <0.30  0.20 - 1.20 mg/dL Final  . Alkaline Phosphatase 05/22/2016 145  40 - 150 U/L Final  . AST 05/22/2016 23  5 - 34 U/L Final  . ALT 05/22/2016 18  0 - 55 U/L Final  . Total Protein 05/22/2016 7.8  6.4 - 8.3 g/dL Final  . Albumin 05/22/2016 3.5  3.5 - 5.0 g/dL Final  . Calcium 05/22/2016 9.8  8.4 - 10.4 mg/dL Final  . Anion Gap 05/22/2016 13* 3 - 11 mEq/L Final  . EGFR 05/22/2016 53* >90 ml/min/1.73 m2 Final    RADIOGRAPHIC STUDIES: No results found.  ASSESSMENT/PLAN:    Constipation Patient states that he suffers with chronic constipation secondary to his pain medications.  He confirmed that he has been taking the MS Contin 15 mg twice daily as directed; but typically only uses 1-3 of the Percocet for breakthrough pain and a day's time.  However, he has become significantly constipated recently; and states that he finally cleared out a very large, firm bowel movement on Monday, 05/28/2016.  Patient stated that he had to strain a great deal to get this bowel movement out; and he has been experiencing significant pain to the rectal region.  Since that time.  He denies any internal or external hemorrhoids; also denies any bleeding from the rectum or in his stool.  He states he's had daily.  Soft formed bowel movements since that time.  He continues to take Colace and Corrin Parker.  He states that he has had little appetite recently; and is afraid to eat because this will make you have to go the bathroom  more.  He continues to drink plenty of fluids and does not feel dehydrated today.  Patient's wife states the patient has been very uncomfortable and the rectal region.  Since straining to the bathroom earlier this week.  He presented to the [facility] today with significant discomfort to this region.  He was at times unable to sit down or sit still because of the pain.  He was slightly diaphoretic and very anxious as well.  Patient's wife stated that "pt is obsesses with his bowels".    Vital signs are stable and patient was afebrile today.  Patient had 2 of his Percocets from home in his pocket today; and he took both of those Percocets.  Within approximate 5 minutes of taking the Percocets.  Patient appeared much calmer.  Long discussion with the patient regarding the need to continue to use stool softeners and laxatives on an as-needed basis.  To prevent constipation secondary to his narcotic use.  Also, Dr. Alen Blew in to review patient as well.  He was in agreement that this is most likely pain and discomfort secondary to significant constipation.  He suggested that we try GoLYTELY for a complete bowel cleanout; and then to continue afterwards with both stool softeners and Marilax/laxatives as needed.  Will prescribe GoLYTELY for patient to use as directed.  Patient was advised to call/return or go directly to the emergency department for any worsening symptoms whatsoever.  Malignant neoplasm of urinary bladder ([place]) Patient received cycle 2, day 8 of his cisplatin/gemcitabine chemotherapy regimen on 05/22/2016.  Patient presented to the [facility] today with complaint of chronic constipation and rectal discomfort following a significant bout of constipation.  Patient also states that he has chronically had some hard of hearing issues; but he felt like his right ear was clogged with water.  He states that that is all better; and he has cleared the water now.  He denies any ringing in  his ears.  See further notes for details of today's visit.  Patient scheduled to return for labs, flush, visit, and his next cycle of chemotherapy on 06/05/2016.   Patient stated understanding of all instructions; and was in agreement with this plan of care. The patient knows to call the clinic with any problems, questions or concerns.   Total time spent with patient was 25 minutes;  with greater than 75 percent of that time spent in face to face counseling regarding patient's symptoms,  and coordination of care and follow up.  Disclaimer:This dictation was prepared with Dragon/digital dictation along with [employer]. Any transcriptional errors that result from this process are unintentional.  Drue Second, NP 05/30/2016    Patient seen and examined and I agree with the plan above. His symptoms appear to be related to constipation rather than tumor progression. We have discussed the different strategies to improve his constipation including laxatives an aggressive bowel regimen. He is agreeable with the current plan.

## 2016-06-04 ENCOUNTER — Other Ambulatory Visit: Payer: Self-pay | Admitting: Family Medicine

## 2016-06-04 ENCOUNTER — Telehealth: Payer: Self-pay | Admitting: *Deleted

## 2016-06-04 NOTE — Telephone Encounter (Signed)
Wife  Calling for refill on mso4. Called, Spoke with patient, states he has oxycodone at home and will wait until he sees dr Alen Blew 06-05-16 for refill on morphine 15 mg. Script was  last filled by dr [name].

## 2016-06-04 NOTE — Telephone Encounter (Signed)
Spoke with patient, per dr Charlynne Pander note okay to refill morphine. Patient states he will just p/u tomorrow at his appt. Patient also requested to be put in a bed tomorrow, he is unable to sit d/t pain. Can only lie down. Beds are already spoken for with new and obese patients. Patient then requested the same chair he had last week because it was softer than the rest of the chairs.

## 2016-06-04 NOTE — Telephone Encounter (Signed)
TC from pt's wife requesting refill on patient's morphine 15 mg ER. Last filled on 04/27/16.  Please  Call wife @ [phone] when prescription is ready for pick up.

## 2016-06-04 NOTE — Telephone Encounter (Signed)
Patient's wife is calling to let Dr. Brigitte Pulse know that the patient wants to go back to taking Ambien. She states that the other medication the patient tried did not work as well.  Alexander Wolf [phone]

## 2016-06-04 NOTE — Telephone Encounter (Signed)
Last OV 9/12, last RF 9/15. Pended Rx.

## 2016-06-04 NOTE — Telephone Encounter (Signed)
OK to refill

## 2016-06-05 ENCOUNTER — Telehealth: Payer: Self-pay | Admitting: Oncology

## 2016-06-05 ENCOUNTER — Other Ambulatory Visit (HOSPITAL_BASED_OUTPATIENT_CLINIC_OR_DEPARTMENT_OTHER): Payer: 59

## 2016-06-05 ENCOUNTER — Ambulatory Visit: Payer: 59

## 2016-06-05 ENCOUNTER — Ambulatory Visit (HOSPITAL_BASED_OUTPATIENT_CLINIC_OR_DEPARTMENT_OTHER): Payer: 59

## 2016-06-05 ENCOUNTER — Ambulatory Visit (HOSPITAL_BASED_OUTPATIENT_CLINIC_OR_DEPARTMENT_OTHER): Payer: 59 | Admitting: Oncology

## 2016-06-05 ENCOUNTER — Ambulatory Visit: Payer: 59 | Admitting: Nutrition

## 2016-06-05 VITALS — BP 132/72 | HR 80 | Temp 98.2°F | Resp 18 | Ht 70.0 in | Wt 221.8 lb

## 2016-06-05 DIAGNOSIS — Z5111 Encounter for antineoplastic chemotherapy: Secondary | ICD-10-CM

## 2016-06-05 DIAGNOSIS — C679 Malignant neoplasm of bladder, unspecified: Secondary | ICD-10-CM

## 2016-06-05 DIAGNOSIS — C675 Malignant neoplasm of bladder neck: Secondary | ICD-10-CM

## 2016-06-05 DIAGNOSIS — Z95828 Presence of other vascular implants and grafts: Secondary | ICD-10-CM | POA: Insufficient documentation

## 2016-06-05 DIAGNOSIS — Z8551 Personal history of malignant neoplasm of bladder: Secondary | ICD-10-CM

## 2016-06-05 LAB — CBC WITH DIFFERENTIAL/PLATELET
BASO%: 0.2 % (ref 0.0–2.0)
BASOS ABS: 0 10*3/uL (ref 0.0–0.1)
EOS ABS: 0.1 10*3/uL (ref 0.0–0.5)
EOS%: 1.2 % (ref 0.0–7.0)
HCT: 33.8 % — ABNORMAL LOW (ref 38.4–49.9)
HGB: 11.3 g/dL — ABNORMAL LOW (ref 13.0–17.1)
LYMPH%: 29.7 % (ref 14.0–49.0)
MCH: 28 pg (ref 27.2–33.4)
MCHC: 33.4 g/dL (ref 32.0–36.0)
MCV: 83.7 fL (ref 79.3–98.0)
MONO#: 1.5 10*3/uL — AB (ref 0.1–0.9)
MONO%: 14.3 % — ABNORMAL HIGH (ref 0.0–14.0)
NEUT#: 5.9 10*3/uL (ref 1.5–6.5)
NEUT%: 54.6 % (ref 39.0–75.0)
PLATELETS: 429 10*3/uL — AB (ref 140–400)
RBC: 4.04 10*6/uL — AB (ref 4.20–5.82)
RDW: 14.1 % (ref 11.0–14.6)
WBC: 10.8 10*3/uL — ABNORMAL HIGH (ref 4.0–10.3)
lymph#: 3.2 10*3/uL (ref 0.9–3.3)

## 2016-06-05 LAB — COMPREHENSIVE METABOLIC PANEL
ALT: 21 U/L (ref 0–55)
ANION GAP: 13 meq/L — AB (ref 3–11)
AST: 24 U/L (ref 5–34)
Albumin: 3 g/dL — ABNORMAL LOW (ref 3.5–5.0)
Alkaline Phosphatase: 138 U/L (ref 40–150)
BILIRUBIN TOTAL: 0.23 mg/dL (ref 0.20–1.20)
BUN: 13.9 mg/dL (ref 7.0–26.0)
CHLORIDE: 101 meq/L (ref 98–109)
CO2: 24 meq/L (ref 22–29)
Calcium: 9.5 mg/dL (ref 8.4–10.4)
Creatinine: 1.1 mg/dL (ref 0.7–1.3)
EGFR: 77 mL/min/{1.73_m2} — AB (ref >90)
Glucose: 152 mg/dl — ABNORMAL HIGH (ref 70–140)
Potassium: 4 mEq/L (ref 3.5–5.1)
Sodium: 139 mEq/L (ref 136–145)
Total Protein: 7.7 g/dL (ref 6.4–8.3)

## 2016-06-05 LAB — TECHNOLOGIST REVIEW: Technologist Review: 2

## 2016-06-05 MED ORDER — SODIUM CHLORIDE 0.9 % IV SOLN
Freq: Once | INTRAVENOUS | Status: AC
  Administered 2016-06-05: 12:00:00 via INTRAVENOUS
  Filled 2016-06-05: qty 5

## 2016-06-05 MED ORDER — PALONOSETRON HCL INJECTION 0.25 MG/5ML
0.2500 mg | Freq: Once | INTRAVENOUS | Status: AC
  Administered 2016-06-05: 0.25 mg via INTRAVENOUS

## 2016-06-05 MED ORDER — SODIUM CHLORIDE 0.9% FLUSH
10.0000 mL | INTRAVENOUS | Status: DC | PRN
  Filled 2016-06-05: qty 10

## 2016-06-05 MED ORDER — SODIUM CHLORIDE 0.9 % IV SOLN
1000.0000 mg/m2 | Freq: Once | INTRAVENOUS | Status: AC
  Administered 2016-06-05: 2318 mg via INTRAVENOUS
  Filled 2016-06-05: qty 60.96

## 2016-06-05 MED ORDER — MANNITOL 25 % IV SOLN
Freq: Once | INTRAVENOUS | Status: AC
  Administered 2016-06-05: 10:00:00 via INTRAVENOUS
  Filled 2016-06-05: qty 10

## 2016-06-05 MED ORDER — HEPARIN SOD (PORK) LOCK FLUSH 100 UNIT/ML IV SOLN
500.0000 [IU] | Freq: Once | INTRAVENOUS | Status: DC | PRN
  Filled 2016-06-05: qty 5

## 2016-06-05 MED ORDER — SODIUM CHLORIDE 0.9 % IV SOLN
Freq: Once | INTRAVENOUS | Status: AC
  Administered 2016-06-05: 12:00:00 via INTRAVENOUS

## 2016-06-05 MED ORDER — CISPLATIN CHEMO INJECTION 100MG/100ML
65.0000 mg/m2 | Freq: Once | INTRAVENOUS | Status: AC
  Administered 2016-06-05: 150 mg via INTRAVENOUS
  Filled 2016-06-05: qty 150

## 2016-06-05 MED ORDER — SODIUM CHLORIDE 0.9% FLUSH
10.0000 mL | INTRAVENOUS | Status: DC | PRN
  Administered 2016-06-05: 10 mL via INTRAVENOUS
  Filled 2016-06-05: qty 10

## 2016-06-05 MED ORDER — PALONOSETRON HCL INJECTION 0.25 MG/5ML
INTRAVENOUS | Status: AC
  Filled 2016-06-05: qty 5

## 2016-06-05 NOTE — Progress Notes (Signed)
Patient was identified to be at risk for malnutrition on the MST secondary to weight loss and poor appetite.  60 year old male diagnosed with cancer of the bladder.  The patient of Dr. Alen Blew.  Past medical history includes diabetes, status post CABG 4, hypertension, hyperlipidemia, GERD, CAD, BPH.  Medications include Lipitor, Glucotrol, Glucophage, fish oil, Prilosec, MiraLAX, and Compazine. Chemotherapy includes cisplatin and gemcitabine.  Labs include Glucose 152 and HGB 11.3.  Height: 5 feet, 10 inches. Weight: 221.8 pounds. UBW: 244 pounds July 2017. BMI: 31.82.  Patient reports poor appetite and painful constipation 1 week ago. Appetite improved after laxative. He eats one good meal daily and a sausage biscuit in the morning but denies snacking. Patient reports fatigue and decreased physical activity. Also reports decreased taste. Reports weight loss is desirable but was not intentional.  Nutrition Diagnosis: Food and nutrition related knowledge deficit related to bladder cancer and associated treatments as evidenced by no prior need for nutrition related information.  Intervention: Patient educated on strategies for consuming small frequent meals with adequate calories and protein to minimize loss of lean body mass. Educated patient on strategies to improve constipation. Provided a list of protein rich foods. Reviewed suggestions for taste alterations. Questions were answered.  Teach back method used.  Contact information was given.  Monitoring, evaluation, goals: Patient will tolerate adequate calories and protein to minimize loss of lean body mass.  Next visit: Patient will contact me with questions or concerns.  **Disclaimer: This note was dictated with voice recognition software. Similar sounding words can inadvertently be transcribed and this note may contain transcription errors which may not have been corrected upon publication of note.**

## 2016-06-05 NOTE — Telephone Encounter (Signed)
AVS report and appointment schedule given to patient, per 06/05/16 los. °

## 2016-06-05 NOTE — Telephone Encounter (Signed)
Dr. Alen Blew advised pt to stop the MS Contin because it was causing to much constipation which is the main source of his pain.

## 2016-06-05 NOTE — Telephone Encounter (Signed)
Last RF 8/18. Pt has been in for several OVs, even check ups in last few months, but don't see insomnia addressed specifically.

## 2016-06-05 NOTE — Patient Instructions (Signed)
[facility] Discharge Instructions for Patients Receiving Chemotherapy  Today you received the following chemotherapy agents:  Cisplatin, Gemzar  To help prevent nausea and vomiting after your treatment, we encourage you to take your nausea medication as prescribed.    If you develop nausea and vomiting that is not controlled by your nausea medication, call the clinic.   BELOW ARE SYMPTOMS THAT SHOULD BE REPORTED IMMEDIATELY:  *FEVER GREATER THAN 100.5 F  *CHILLS WITH OR WITHOUT FEVER  NAUSEA AND VOMITING THAT IS NOT CONTROLLED WITH YOUR NAUSEA MEDICATION  *UNUSUAL SHORTNESS OF BREATH  *UNUSUAL BRUISING OR BLEEDING  TENDERNESS IN MOUTH AND THROAT WITH OR WITHOUT PRESENCE OF ULCERS  *URINARY PROBLEMS  *BOWEL PROBLEMS  UNUSUAL RASH Items with * indicate a potential emergency and should be followed up as soon as possible.  Feel free to call the clinic you have any questions or concerns. The clinic phone number is [phone].  Please show the CHEMO ALERT CARD at check-in to the Emergency Department and triage nurse.   

## 2016-06-05 NOTE — Telephone Encounter (Signed)
That's fine.  If he is not staying asleep for long enough on ambien, please call us as now that he has failed [place] he would not meet the PA requirements to receiving extended release ambien. Please call in zolpidem rx after you confirm with pt. Thanks.

## 2016-06-05 NOTE — Progress Notes (Signed)
Hematology and Oncology Follow Up Visit  Alexander Wolf [id number] [birth year] 60 y.o. 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   Prior Therapy: He underwent TURBT on 03/30/2016 as well as TURP and the left bladder wall biopsy.   Current therapy: He is currently receiving neoadjuvant chemotherapy utilizing cisplatin and gemcitabine. Therapy started on 04/24/2016. He is here to start cycle 3 of therapy.  Interim History: Alexander Wolf presents today for a follow-up visit. Since the last visit, he reports issues with constipation and severe pain associated with evacuation. He was seen urgently last week and laxatives were given to the patient. He was able to evacuate completely and felt much better since. He has not been taking any morphine since Sunday because he ran out. He relates his pain is predominantly related to constipation and certainly his constipation is exacerbated by pain medication.   He continues to tolerate chemotherapy without major complications.  He denied any nausea, vomiting or abdominal pain. He does not report any peripheral neuropathy. He did not have any infusion-related complications. He denied any hearing deficits or electrolyte imbalances. This constipation could have been exacerbated by chemotherapy.  He continues to use Percocet periodically although is unclear whether he really needs it. He describes spasms and jerking in his lower abdominal and pelvic area and at times he uses that to prevent it.  He does not report any headaches, blurry vision, syncope or seizures. He does not report any fevers, chills, sweats. He does not report any chest pain, palpitation, orthopnea or leg edema. He does not report any cough, wheezing or hemoptysis. He does not report any nausea, vomiting, abdominal  pain, gross satiety, constipation or diarrhea. He does not report any frequency, urgency or hesitancy. He denied dysuria or hematuria. He does not report any lymphadenopathy or petechiae. He does not report any skeletal complaints. Remaining review of systems unremarkable  Medications: I have reviewed the patient's current medications.  Current Outpatient Prescriptions  Medication Sig Dispense Refill  . amitriptyline (ELAVIL) 25 MG tablet Take 1 tablet (25 mg total) by mouth at bedtime. 30 tablet 1  . atorvastatin (LIPITOR) 40 MG tablet Take 1 tablet (40 mg total) by mouth daily. 90 tablet 1  . docusate sodium (COLACE) 100 MG capsule Take 1 capsule (100 mg total) by mouth 2 (two) times daily as needed for mild constipation. 60 capsule 0  . Eszopiclone 3 MG TABS TK 1 T PO D IMM B BED  0  . glipiZIDE (GLUCOTROL) 5 MG tablet Take 1 tablet (5 mg total) by mouth 2 (two) times daily before a meal. 180 tablet 3  . lidocaine-prilocaine (EMLA) cream Apply to port before chemotherapy. 30 g 0  . lisinopril (PRINIVIL,ZESTRIL) 20 MG tablet Take 1 tablet (20 mg total) by mouth daily. 90 tablet 1  . metFORMIN (GLUCOPHAGE) 1000 MG tablet Take 1 tablet (1,000 mg total) by mouth 2 (two) times daily with a meal. 180 tablet 0  . metoprolol (LOPRESSOR) 50 MG tablet Take 1/2 tab PO BID (Patient taking differently: Take 25 mg by mouth 2 (two) times daily. ) 90 tablet 1  . morphine (MS CONTIN) 15 MG 12 hr tablet Take 1 tablet (15 mg total) by mouth every 12 (twelve) hours. 60 tablet 0  . Omega-3 Fatty Acids (FISH OIL) 300 MG CAPS Take 600 capsules by  mouth 2 (two) times daily.     Marland Kitchen omeprazole (PRILOSEC) 40 MG capsule TAKE 1 CAPSULE BY MOUTH EVERY DAY 90 capsule 1  . oxybutynin (DITROPAN) 5 MG tablet Take 1 tablet (5 mg total) by mouth every 8 (eight) hours as needed for bladder spasms. 30 tablet 1  . oxyCODONE-acetaminophen (ROXICET) 5-325 MG tablet Take 1-2 tablets by mouth every 4 (four) hours as needed for severe  pain. 120 tablet 0  . polyethylene glycol powder (GLYCOLAX/MIRALAX) powder Take 17 g by mouth 4 (four) times daily as needed. 500 g 11  . prochlorperazine (COMPAZINE) 10 MG tablet Take 1 tablet (10 mg total) by mouth every 6 (six) hours as needed for nausea or vomiting. 30 tablet 0  . promethazine (PHENERGAN) 25 MG tablet TK 1 T PO Q 6 H PRF NAUSEA  1  . zaleplon (SONATA) 10 MG capsule Take 1 capsule (10 mg total) by mouth at bedtime as needed for sleep. 30 capsule 0  . zolpidem (AMBIEN) 10 MG tablet TK 1 T PO QHS PRF SLP  0   No current facility-administered medications for this visit.      Allergies: No Known Allergies  Past Medical History, Surgical history, Social history, and Family History were reviewed and updated.   Physical Exam: Blood pressure 132/72, pulse 80, temperature 98.2 F (36.8 C), temperature source Oral, resp. rate 18, height 5\' 10"  (1.778 m), weight 221 lb 12.8 oz (100.6 kg), SpO2 98 %. ECOG: 0 General appearance: Well-appearing gentleman appeared without distress. Head: Normocephalic, without obvious abnormality no oral ulcers or lesions. Neck: no adenopathy Lymph nodes: Cervical, supraclavicular, and axillary nodes normal. Heart:regular rate and rhythm, S1, S2 normal, no murmur, click, rub or gallop Lung:chest clear, no wheezing, rales, normal symmetric air entry Abdomin: soft, non-tender, without masses or organomegaly no shifting dullness or ascites. EXT:no erythema, induration, or nodules   Lab Results: Lab Results  Component Value Date   WBC 10.8 (H) 06/05/2016   HGB 11.3 (L) 06/05/2016   HCT 33.8 (L) 06/05/2016   MCV 83.7 06/05/2016   PLT 429 (H) 06/05/2016     Chemistry      Component Value Date/Time   NA 134 (L) 05/22/2016 1046   K 4.2 05/22/2016 1046   CL 101 04/19/2016 1245   CO2 24 05/22/2016 1046   BUN 24.0 05/22/2016 1046   CREATININE 1.4 (H) 05/22/2016 1046      Component Value Date/Time   CALCIUM 9.8 05/22/2016 1046   ALKPHOS  145 05/22/2016 1046   AST 23 05/22/2016 1046   ALT 18 05/22/2016 1046   BILITOT <0.30 05/22/2016 1046         Impression and Plan:  60 year old gentleman with the following issues:  1. Urothelial carcinoma arising from the bladder neck presented with a tumor measuring 1.5 cm with a previous history of superficial bladder tumors. His tumor invades into the prostatic stroma without any evidence of local lymphadenopathy or distantl metastasis. His pathological staging was T4a, NX.  He is receiving neoadjuvant chemotherapy utilizing cisplatin and gemcitabine. He tolerated chemotherapy reasonably well without any major complications. The plan is to proceed with cycle 3 of therapy without any dose reduction or delay.  After completing his third cycle of chemotherapy, he will be evaluated by Dr. Tammi Klippel for possible cystectomy. I have recommended proceeding with cystectomy after 3 cycles of therapy for better tolerance.   2. IV access:  Port-A-Cath inserted without complications. EMLA cream is available to the patient. This will  be flushed periodically after completion of chemotherapy.  3. Antiemetics: Compazine is available to the patient. He had little nausea and vomiting noted.  4. Renal function surveillance: His creatinine did increase slightly to 1.4 but have fluctuated lower in the past. We will recheck his creatinine today and adjust cisplatin dosing depending on that.  5. Pain: Predominantly lower abdominal and pelvic and related to his bladder cancer. I asked him to stop long-acting morphine and only use pain medication as needed.  6. DVT prophylaxis: He has increased risk of developing thrombosis and discussed recommendations to decrease his risk which include ambulation and periodic stretching.   7. Constipation: I continue to emphasize the importance of having soft bowel movements are regular basis while he is on pain medication. Stool softeners or laxative are available to the  patient.  8. Follow-up: Will be in one week for day 8 of cycle 3 of therapy.   [facility], MD 10/24/20178:45 AM

## 2016-06-06 ENCOUNTER — Other Ambulatory Visit: Payer: Self-pay | Admitting: Family Medicine

## 2016-06-06 NOTE — Telephone Encounter (Signed)
Called in Rx and notified pt. He stated he is using the amitriptyline with the [place] and it is helping. Pt will try this combo and call back if it is not effective and he needs [place] to try to get the ambien XR covered again.

## 2016-06-11 ENCOUNTER — Other Ambulatory Visit: Payer: Self-pay | Admitting: Family Medicine

## 2016-06-11 DIAGNOSIS — I10 Essential (primary) hypertension: Secondary | ICD-10-CM

## 2016-06-12 ENCOUNTER — Other Ambulatory Visit (HOSPITAL_BASED_OUTPATIENT_CLINIC_OR_DEPARTMENT_OTHER): Payer: 59

## 2016-06-12 ENCOUNTER — Ambulatory Visit (HOSPITAL_BASED_OUTPATIENT_CLINIC_OR_DEPARTMENT_OTHER): Payer: 59

## 2016-06-12 ENCOUNTER — Ambulatory Visit: Payer: 59

## 2016-06-12 VITALS — BP 131/85 | HR 71 | Temp 97.6°F | Resp 16

## 2016-06-12 DIAGNOSIS — C679 Malignant neoplasm of bladder, unspecified: Secondary | ICD-10-CM

## 2016-06-12 DIAGNOSIS — C675 Malignant neoplasm of bladder neck: Secondary | ICD-10-CM

## 2016-06-12 DIAGNOSIS — Z8551 Personal history of malignant neoplasm of bladder: Secondary | ICD-10-CM

## 2016-06-12 DIAGNOSIS — Z5111 Encounter for antineoplastic chemotherapy: Secondary | ICD-10-CM | POA: Diagnosis not present

## 2016-06-12 DIAGNOSIS — Z95828 Presence of other vascular implants and grafts: Secondary | ICD-10-CM

## 2016-06-12 LAB — CBC WITH DIFFERENTIAL/PLATELET
BASO%: 0.9 % (ref 0.0–2.0)
Basophils Absolute: 0.1 10*3/uL (ref 0.0–0.1)
EOS ABS: 0 10*3/uL (ref 0.0–0.5)
EOS%: 0.3 % (ref 0.0–7.0)
HCT: 35.7 % — ABNORMAL LOW (ref 38.4–49.9)
HEMOGLOBIN: 11.6 g/dL — AB (ref 13.0–17.1)
LYMPH#: 3.8 10*3/uL — AB (ref 0.9–3.3)
LYMPH%: 34.3 % (ref 14.0–49.0)
MCH: 27.3 pg (ref 27.2–33.4)
MCHC: 32.4 g/dL (ref 32.0–36.0)
MCV: 84 fL (ref 79.3–98.0)
MONO#: 1 10*3/uL — AB (ref 0.1–0.9)
MONO%: 8.7 % (ref 0.0–14.0)
NEUT%: 55.8 % (ref 39.0–75.0)
NEUTROS ABS: 6.2 10*3/uL (ref 1.5–6.5)
PLATELETS: 337 10*3/uL (ref 140–400)
RBC: 4.24 10*6/uL (ref 4.20–5.82)
RDW: 13.9 % (ref 11.0–14.6)
WBC: 11.2 10*3/uL — AB (ref 4.0–10.3)

## 2016-06-12 LAB — COMPREHENSIVE METABOLIC PANEL
ALBUMIN: 3.3 g/dL — AB (ref 3.5–5.0)
ALK PHOS: 148 U/L (ref 40–150)
ALT: 17 U/L (ref 0–55)
ANION GAP: 14 meq/L — AB (ref 3–11)
AST: 20 U/L (ref 5–34)
BILIRUBIN TOTAL: 0.36 mg/dL (ref 0.20–1.20)
BUN: 24.5 mg/dL (ref 7.0–26.0)
CO2: 24 mEq/L (ref 22–29)
CREATININE: 1.3 mg/dL (ref 0.7–1.3)
Calcium: 10.6 mg/dL — ABNORMAL HIGH (ref 8.4–10.4)
Chloride: 99 mEq/L (ref 98–109)
EGFR: 58 mL/min/{1.73_m2} — AB (ref >90)
GLUCOSE: 130 mg/dL (ref 70–140)
Potassium: 4.3 mEq/L (ref 3.5–5.1)
Sodium: 137 mEq/L (ref 136–145)
TOTAL PROTEIN: 8.3 g/dL (ref 6.4–8.3)

## 2016-06-12 LAB — TECHNOLOGIST REVIEW

## 2016-06-12 MED ORDER — SODIUM CHLORIDE 0.9% FLUSH
10.0000 mL | INTRAVENOUS | Status: DC | PRN
  Administered 2016-06-12: 10 mL
  Filled 2016-06-12: qty 10

## 2016-06-12 MED ORDER — HEPARIN SOD (PORK) LOCK FLUSH 100 UNIT/ML IV SOLN
500.0000 [IU] | Freq: Once | INTRAVENOUS | Status: AC | PRN
  Administered 2016-06-12: 500 [IU]
  Filled 2016-06-12: qty 5

## 2016-06-12 MED ORDER — PROCHLORPERAZINE MALEATE 10 MG PO TABS
ORAL_TABLET | ORAL | Status: AC
  Filled 2016-06-12: qty 1

## 2016-06-12 MED ORDER — SODIUM CHLORIDE 0.9% FLUSH
10.0000 mL | INTRAVENOUS | Status: DC | PRN
  Administered 2016-06-12: 10 mL via INTRAVENOUS
  Filled 2016-06-12: qty 10

## 2016-06-12 MED ORDER — SODIUM CHLORIDE 0.9 % IV SOLN
1000.0000 mg/m2 | Freq: Once | INTRAVENOUS | Status: AC
  Administered 2016-06-12: 2318 mg via INTRAVENOUS
  Filled 2016-06-12: qty 60.96

## 2016-06-12 MED ORDER — PROCHLORPERAZINE MALEATE 10 MG PO TABS
10.0000 mg | ORAL_TABLET | Freq: Once | ORAL | Status: AC
  Administered 2016-06-12: 10 mg via ORAL

## 2016-06-12 MED ORDER — SODIUM CHLORIDE 0.9 % IV SOLN
Freq: Once | INTRAVENOUS | Status: AC
  Administered 2016-06-12: 14:00:00 via INTRAVENOUS

## 2016-06-13 ENCOUNTER — Other Ambulatory Visit: Payer: Self-pay | Admitting: Family Medicine

## 2016-06-13 DIAGNOSIS — I10 Essential (primary) hypertension: Secondary | ICD-10-CM

## 2016-06-13 MED ORDER — AMITRIPTYLINE HCL 25 MG PO TABS: 25.0000 mg | ORAL_TABLET | Freq: Every day | ORAL | 0 refills | Status: DC

## 2016-06-13 NOTE — Telephone Encounter (Signed)
Patient needs his amitriptyline (ELAVIL) 25 MG tablet and his metoprolol (LOPRESSOR) 50 MG tablet refilled. He is completely out of his metoprolol (LOPRESSOR) 50 MG tablet the pharmacy has given him an emergency supply to last a week until we can get them filled again.  [facility] on [address].

## 2016-06-13 NOTE — Addendum Note (Signed)
Addended by: Elwyn Reach A on: 06/13/2016 03:52 PM   Modules accepted: Orders

## 2016-06-13 NOTE — Telephone Encounter (Signed)
Sent in RFs and notified pt on Vm.

## 2016-06-14 ENCOUNTER — Other Ambulatory Visit: Payer: Self-pay | Admitting: *Deleted

## 2016-06-14 MED ORDER — PROCHLORPERAZINE MALEATE 10 MG PO TABS
10.0000 mg | ORAL_TABLET | Freq: Four times a day (QID) | ORAL | 1 refills | Status: DC | PRN
Start: 2016-06-14 — End: 2016-07-19

## 2016-06-18 ENCOUNTER — Other Ambulatory Visit: Payer: Self-pay | Admitting: *Deleted

## 2016-06-18 ENCOUNTER — Telehealth: Payer: Self-pay | Admitting: *Deleted

## 2016-06-18 MED ORDER — OXYCODONE-ACETAMINOPHEN 5-325 MG PO TABS: 1.0000 | ORAL_TABLET | ORAL | 0 refills | Status: DC | PRN

## 2016-06-18 MED FILL — OXYCODONE/APAP 5/325 MG TAB: 5-325 | 10 days supply | Qty: 120 | Fill #0

## 2016-06-18 NOTE — Telephone Encounter (Signed)
Call from pt requesting refill on Oxycodone. Pt reports prostate pain 10/10. Oxycodone is effective, gets it down to 4/10. Usually has to take about 4 tabs per day and ran out over the weekend. Pain is keeping him awake at night. Message forwarded to Dr. Hazeline Junker pod for refill.

## 2016-06-18 NOTE — Telephone Encounter (Signed)
Spoke with patient, let him know his refill script for oxycodone 5-325 ready for p/u.

## 2016-06-21 ENCOUNTER — Encounter: Payer: Self-pay | Admitting: Oncology

## 2016-06-22 ENCOUNTER — Telehealth: Payer: Self-pay

## 2016-06-22 NOTE — Telephone Encounter (Signed)
Patient called in stating that he needs some medication for hemorrhoids. He wanted to get something for swelling and pain he states he has tried everything. accoring to the patient this is a situation he has had before and Dr Brigitte Pulse is aware of so he doesn't think she will need to see him to give him something. I told him that I would put the message in and someone would give him a call.  His call back number is [phone] and [facility] uses the [facility] at  [facility] and [address].

## 2016-06-25 NOTE — Telephone Encounter (Signed)
Recommend coming in for eval.  I have never seen him for hemorrhoids before that I am aware of even after a review of all of my notes.  My concern is that if the otc stuff isn't helping, it may be because it is not a hemorrhoid - a rectal fissure or inflammation could cause similar pain but rx hermorrhoid treatments could make this much worse. Also if the hemorrhoid is thrombosed, the clot would need to be evacuated to treat.  Also, no matter what medication is used, hemorrhoids won't heal as long as he is constipated so make sure his BMs stay very soft and he is not straining at all.

## 2016-06-25 NOTE — Telephone Encounter (Signed)
Dr Brigitte Pulse, you last saw pt for check up in Sept. I do see that you have treated him for constipation and Rxd colace before, but don't see any Rx hemorrhoid med previously.

## 2016-06-26 NOTE — Telephone Encounter (Signed)
Attempted to call pt, left VM for pt to call back  

## 2016-06-28 ENCOUNTER — Telehealth: Payer: Self-pay

## 2016-06-28 ENCOUNTER — Ambulatory Visit (INDEPENDENT_AMBULATORY_CARE_PROVIDER_SITE_OTHER): Payer: 59 | Admitting: Family Medicine

## 2016-06-28 ENCOUNTER — Encounter: Payer: Self-pay | Admitting: Family Medicine

## 2016-06-28 VITALS — BP 128/74 | HR 92 | Temp 97.9°F | Resp 18 | Ht 70.0 in | Wt 206.6 lb

## 2016-06-28 DIAGNOSIS — K645 Perianal venous thrombosis: Secondary | ICD-10-CM | POA: Diagnosis not present

## 2016-06-28 MED ORDER — LIDOCAINE (ANORECTAL) 5 % EX GEL: 1.0000 [in_us] | CUTANEOUS | 1 refills | Status: DC | PRN

## 2016-06-28 MED ORDER — DIAZEPAM 5 MG PO TABS: ORAL_TABLET | ORAL | 0 refills | Status: DC

## 2016-06-28 MED ORDER — OXYCODONE-ACETAMINOPHEN 10-325 MG PO TABS: 1.0000 | ORAL_TABLET | ORAL | 0 refills | Status: DC | PRN

## 2016-06-28 NOTE — Progress Notes (Signed)
Subjective:    Patient ID: Alexander Wolf, male    DOB: 08-30-1955, 60 y.o.   MRN: [id number] Chief Complaint  Patient presents with  . Rectal Pain    HPI  For over a month he is having a pain.  He feels like his prostate is swollen again. He has a weak stream.  It is a constant urge to defecate Does have an occasional ouside lump - several, one that stays there th at is hard. Some that come and go.l He is having to lean forward on his elbows as he can't lay on his back. Sitz bath 3-4x/d Preparation H with lidcaine many times a day, different products. Will temporarily ease it off sometimes  Patient is panic that his prostate is causing his current symptoms or that perhaps the cancer has spread to his rectum. He spends most of his days in bed due to pain with walking or sitting or activity. He has been receiving oxycodone 5 mg from his oncologist but wife reports that patient is terrified he is going to run short and so obsesses over trying not to use too much resulting in him spending most of his time unable to move due to pain. She is finally sleeping better on zolpidem 10 mg daily at bedtime. Has stopped eating so that he does not have as many bowel movements. Has successfully changed his bowels in 2 loose diarrhea. No constipation nor painful stools in the past several months with the routine use of softeners and laxatives.  Got chemo down and is waiting to undergo cystectomy and prostatectomy. Saw urology on 11/7 so surgery is end of Jan  2 tabs every 4 hours of oxycodone is barely controlling his pain.   He is sleeping but getting up twice a night to pee and sleeping well on zolpidem 10.  Still on the amitryptiline. Occ nausea.  Full very quikcly.  12/7 Is scheduled for second opinion with [facility] urology.  Feels that the MS Contin caused a lot of constipation  Depression screen [facility] 2/9 06/28/2016 04/07/2016 03/10/2016 03/05/2016 05/27/2015  Decreased Interest 0 0 0 0 0  Down,  Depressed, Hopeless 0 0 0 3 0  PHQ - 2 Score 0 0 0 3 0  Altered sleeping - - - 3 -  Tired, decreased energy - - - 3 -  Change in appetite - - - 3 -  Feeling bad or failure about yourself  - - - 0 -  Trouble concentrating - - - 0 -  Moving slowly or fidgety/restless - - - 0 -  Suicidal thoughts - - - 0 -  PHQ-9 Score - - - 12 -  Difficult doing work/chores - - - Somewhat difficult -   Past Medical History:  Diagnosis Date  . Bladder cancer ([place])    [facility] in [place]; /  urologist in [place]-  dr dahlstedt  . BPH (benign prostatic hypertrophy) with urinary retention   . Coronary artery disease    s/p CABG  (per pt hasn't seen cardiologist for several years)  . Foley catheter in place   . GERD (gastroesophageal reflux disease)   . Hyperlipidemia   . Hypertension   . Insomnia   . 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 Type 2 diabetes mellitus ([place])    Past Surgical History:  Procedure Laterality Date  . CARDIAC CATHETERIZATION  10/26/2003  dr Leonia Reeves   ETT + ischemia/  severe 3 vessel avcad,  normal LVF, ef 55-60%  . CORONARY ARTERY BYPASS GRAFT  10-28-2003   dr Ricard Dillon   LIMA to dLAD,  RIMA to OM1,  SVG to OM2,  SVG to  right posterolateral   . CYSTOSCOPY N/A 03/29/2016   Procedure: CYSTOSCOPY;  Surgeon: Franchot Gallo, MD;  Location: [facility];  Service: Urology;  Laterality: N/A;  . IR GENERIC HISTORICAL  04/19/2016   IR US GUIDE VASC ACCESS RIGHT 04/19/2016 Corrie Mckusick, DO [facility]  . IR GENERIC HISTORICAL  04/19/2016   IR FLUORO GUIDE PORT INSERTION RIGHT 04/19/2016 Corrie Mckusick, DO [facility]  . TONSILLECTOMY  as child  . TRANSURETHRAL RESECTION OF BLADDER TUMOR  2014  . TRANSURETHRAL RESECTION OF BLADDER TUMOR WITH GYRUS (TURBT-GYRUS) N/A 03/29/2016   Procedure: TRANSURETHRAL RESECTION OF BLADDER TUMOR WITH GYRUS (TURBT-GYRUS);  Surgeon: Franchot Gallo, MD;  Location: [facility];  Service: Urology;  Laterality: N/A;  . TRANSURETHRAL  RESECTION OF PROSTATE N/A 03/29/2016   Procedure: TRANSURETHRAL RESECTION OF THE PROSTATE (TURP);  Surgeon: Franchot Gallo, MD;  Location: [facility];  Service: Urology;  Laterality: N/A;   Current Outpatient Prescriptions on File Prior to Visit  Medication Sig Dispense Refill  . amitriptyline (ELAVIL) 25 MG tablet Take 1 tablet (25 mg total) by mouth at bedtime. 90 tablet 0  . atorvastatin (LIPITOR) 40 MG tablet Take 1 tablet (40 mg total) by mouth daily. 90 tablet 1  . docusate sodium (COLACE) 100 MG capsule Take 1 capsule (100 mg total) by mouth 2 (two) times daily as needed for mild constipation. 60 capsule 0  . glipiZIDE (GLUCOTROL) 5 MG tablet Take 1 tablet (5 mg total) by mouth 2 (two) times daily before a meal. 180 tablet 3  . lidocaine-prilocaine (EMLA) cream Apply to port before chemotherapy. 30 g 0  . lisinopril (PRINIVIL,ZESTRIL) 20 MG tablet Take 1 tablet (20 mg total) by mouth daily. 90 tablet 1  . metFORMIN (GLUCOPHAGE) 1000 MG tablet Take 1 tablet (1,000 mg total) by mouth 2 (two) times daily with a meal. 180 tablet 0  . metoprolol (LOPRESSOR) 50 MG tablet TAKE 1/2 TABLET BY MOUTH TWICE DAILY 90 tablet 0  . Omega-3 Fatty Acids (FISH OIL) 300 MG CAPS Take 600 capsules by mouth 2 (two) times daily.     Marland Kitchen omeprazole (PRILOSEC) 40 MG capsule TAKE 1 CAPSULE BY MOUTH EVERY DAY 90 capsule 1  . polyethylene glycol powder (GLYCOLAX/MIRALAX) powder Take 17 g by mouth 4 (four) times daily as needed. 500 g 11  . prochlorperazine (COMPAZINE) 10 MG tablet Take 1 tablet (10 mg total) by mouth every 6 (six) hours as needed for nausea or vomiting. 30 tablet 1  . promethazine (PHENERGAN) 25 MG tablet TK 1 T PO Q 6 H PRF NAUSEA  1  . zolpidem (AMBIEN) 10 MG tablet TAKE 1 TABLET BY MOUTH EVERY NIGHT AT BEDTIME AS NEEDED FOR SLEEP 30 tablet 2   No current facility-administered medications on file prior to visit.    No Known Allergies Family History  Problem Relation Age of Onset    . Varicose Veins Mother    Social History   Social History  . Marital status: Married    Spouse name: N/A  . Number of children: 3  . Years of education: N/A   Occupational History  . welder    Social History Main Topics  . Smoking status: Former Smoker    Packs/day: 1.00    Years: 44.00  Types: Cigarettes    Quit date: 03/22/2012  . Smokeless tobacco: Never Used  . Alcohol use No  . Drug use: No  . Sexual activity: Yes   Other Topics Concern  . None   Social History Narrative   Marital status: married x 12 years; second marriage.      Children: 3 children; 3 grandchildren; no gg      Lives: with wife      Employment:  Building control surveyor; Architect; 40 hours per week      Tobacco: quit in 3 years ago in 2013; smoked 45 years      Alcohol:  None      Drugs:  None      Exercise: works hard every day      Seatbelt:  100%; no texting while driving         Review of Systems  Constitutional: Positive for activity change, appetite change, chills, diaphoresis and fatigue. Negative for fever and unexpected weight change.  Cardiovascular: Negative for leg swelling.  Gastrointestinal: Positive for diarrhea, nausea and rectal pain. Negative for abdominal distention, abdominal pain, anal bleeding, blood in stool, constipation and vomiting.  Genitourinary: Positive for difficulty urinating.  Musculoskeletal: Positive for arthralgias, back pain and gait problem.  Skin: Negative for rash.  Psychiatric/Behavioral: Positive for sleep disturbance.  see hpi     Objective:   Physical Exam  Constitutional: He is oriented to person, place, and time. He appears well-developed and well-nourished. He appears ill. He appears distressed.  Patient with pale grayish hue to skin Pacing around the room continually Tearful, very anxious, difficult to reason with patient due to his anxiety. He starts hyperventilating and becoming diaphoretic with panic attack when I proposed doing a hemorrhoidectomy  of the thrombosed hemorrhoid that is causing him severe pain.  HENT:  Head: Normocephalic and atraumatic.  Eyes: No scleral icterus.  Pulmonary/Chest: Effort normal.  Genitourinary: Rectal exam shows external hemorrhoid, tenderness and anal tone abnormal (increased).  Genitourinary Comments: Has 4-2mm external thrombosis at approx 2 oclock. The external hemorrhoid itself is much larger almost the size of a nickel from 1 to 4:00 but patient has severe pain over the thrombosed round frozen pea-sized nodule  Neurological: He is alert and oriented to person, place, and time.  Skin: Skin is warm and dry. He is not diaphoretic.  Psychiatric: His speech is normal. His mood appears anxious. He is agitated. He is not aggressive and not slowed. He exhibits a depressed mood.      BP 128/74   Pulse 92   Temp 97.9 F (36.6 C) (Oral)   Resp 18   Ht 5\' 10"  (1.778 m)   Wt 206 lb 9.6 oz (93.7 kg)   SpO2 98%   BMI 29.64 kg/m      Assessment & Plan:   1. External thrombosed hemorrhoids   Mr. Vaswani has had a very difficult and traumatic course starting with bladder cancer several years prior. He was lost to follow-up and during that time the cancer recurred and metastasized to his prostate. He has been recommended to have a complete cystectomy and prostatectomy with urostomy tube placed and is scheduled to have this in late January/February 2018. He does have an appointment for a second opinion at [facility] urology in several weeks.   I think patient's pain control needs to be significantly improved. His pain level is resulting in unhealthy behaviors such as voluntary undernutrition and severely decreased activity which could result in a worse outcome with  his upcoming surgery.  Therefore increased her oxycodone from 5-10 mg.  He will continue to use laxatives and softeners to avoid any firm stool.  After discussion with patient and his wife, they agreed to return to clinic first thing tomorrow for a  thrombctomy of his hemorrhoid. Patient has a panic attack as we discuss this so advise pretreating with po Valium and topical lidocaine tomorrow morning. Offered referral to GI or general surgery for this but patient declines.  Meds ordered this encounter  Medications  . DISCONTD: Lidocaine, Anorectal, 5 % GEL    Sig: Apply 1 inch topically every 30 (thirty) minutes as needed.    Dispense:  30 g    Refill:  1  . diazepam (VALIUM) 5 MG tablet    Sig: Take 1 tab by mouth on an empty stomach 2 hrs prior to procedure. Repeat in 60 min if needed    Dispense:  30 tablet    Refill:  0  . oxyCODONE-acetaminophen (PERCOCET) 10-325 MG tablet    Sig: Take 1-2 tablets by mouth every 4 (four) hours as needed for pain.    Dispense:  120 tablet    Refill:  0  . Lidocaine, Anorectal, 5 % GEL    Sig: Apply 1 inch topically every 30 (thirty) minutes as needed.    Dispense:  30 g    Refill:  1   Over 25 min spent in face-to-face evaluation of and consultation with patient and coordination of care.  Over 50% of this time was spent counseling this patient.  [name], M.D.  [facility] [address] [place], [place] [address] [phone] phone [phone] fax  07/02/16 11:36 AM

## 2016-06-28 NOTE — Patient Instructions (Addendum)
IF you received an x-ray today, you will receive an invoice from [facility]. Please contact [facility] at [phone] with questions or concerns regarding your invoice.   IF you received labwork today, you will receive an invoice from [employer]. Please contact [facility] at [phone] with questions or concerns regarding your invoice.   Our billing staff will not be able to assist you with questions regarding bills from these companies.  You will be contacted with the lab results as soon as they are available. The fastest way to get your results is to activate your My Chart account. Instructions are located on the last page of this paperwork. If you have not heard from [place] regarding the results in 2 weeks, please contact this office.     Surgical Procedures for Hemorrhoids Introduction Surgical procedures can be used to treat hemorrhoids. Hemorrhoids are swollen veins that are inside the rectum (internal hemorrhoids) or around the anus (external hemorrhoids). They are caused by increased pressure in the anal area. This pressure may result from straining to have a bowel movement (constipation), diarrhea, pregnancy, obesity, anal sex, or sitting for long periods of time. Hemorrhoids can cause symptoms such as pain and bleeding. Surgery may be needed if diet changes, lifestyle changes, and other treatments do not help your symptoms. Various surgical methods may be used. Three common methods are:  Closed hemorrhoidectomy. The hemorrhoids are surgically removed, and the surgical cuts (incisions) are closed with stitches (sutures).  Open hemorrhoidectomy. The hemorrhoids are surgically removed, but the incisions are allowed to heal without sutures.  Stapled hemorrhoidopexy. The hemorrhoids are removed using a device that takes out a ring of excess tissue. Tell a health care provider about:  Any allergies you have.  All medicines you are  taking, including vitamins, herbs, eye drops, creams, and over-the-counter medicines.  Any problems you or family members have had with anesthetic medicines.  Any blood disorders you have.  Any surgeries you have had.  Any medical conditions you have.  Whether you are pregnant or may be pregnant. What are the risks? Generally, this is a safe procedure. However, problems may occur, including:  Infection.  Bleeding.  Allergic reactions to medicines.  Damage to other structures or organs.  Pain.  Constipation.  Difficulty passing urine.  Narrowing of the anal canal (stenosis).  Difficulty controlling bowel movements (incontinence). What happens before the procedure?  Ask your health care provider about:  Changing or stopping your regular medicines. This is especially important if you are taking diabetes medicines or blood thinners.  Taking medicines such as aspirin and ibuprofen. These medicines can thin your blood. Do not take these medicines before your procedure if your health care provider instructs you not to.  You may need to have a procedure to examine the inside of your colon with a scope (colonoscopy). Your health care provider may do this to make sure that there are no other causes for your bleeding or pain.  Follow instructions from your health care provider about eating or drinking restrictions.  You may be instructed to take a laxative and an enema to clean out your colon before surgery (bowel prep). Carefully follow instructions from your health care provider about bowel prep.  Ask your health care provider how your surgical site will be marked or identified.  You may be given antibiotic medicine to help prevent infection.  Plan to have someone take you home after the procedure. What happens during the procedure?  To reduce your risk of infection:  Your health care team will wash or sanitize their hands.  Your skin will be washed with soap.  An IV  tube will be inserted into one of your veins.  You will be given one or more of the following:  A medicine to help you relax (sedative).  A medicine to numb the area (local anesthetic).  A medicine to make you fall asleep (general anesthetic).  A medicine that is injected into an area of your body to numb everything below the injection site (regional anesthetic).  A lubricating jelly may be placed into your rectum.  Your surgeon will insert a short scope (anoscope) into your rectum to examine the hemorrhoids.  One of the following hemorrhoid procedures will be performed. Closed Hemorrhoidectomy  Your surgeon will use surgical instruments to open the tissue around the hemorrhoids.  The veins that supply the hemorrhoids will be tied off with a suture.  The hemorrhoids will be removed.  The tissue that surrounds the hemorrhoids will be closed with sutures that your body can absorb (absorbable sutures). Open Hemorrhoidectomy  The hemorrhoids will be removed with surgical instruments.  The incisions will be left open to heal without sutures. Stapled Hemorrhoidopexy  Your surgeon will use a circular stapling device to remove the hemorrhoids.  The device will be inserted into your anus. It will remove a circular ring of tissue that includes hemorrhoid tissue and some tissue above the hemorrhoids.  The staples in the device will close the edges of removed tissue. This will cut off the blood supply to the hemorrhoids and will pull any remaining hemorrhoids back into place. Each of these procedures may vary among health care providers and hospitals. What happens after the procedure?  Your blood pressure, heart rate, breathing rate, and blood oxygen level will be monitored often until the medicines you were given have worn off.  You will be given pain medicine as needed. This information is not intended to replace advice given to you by your health care provider. Make sure you  discuss any questions you have with your health care provider. Document Released: 05/27/2009 Document Revised: 01/05/2016 Document Reviewed: 10/25/2014  2017 Elsevier

## 2016-06-28 NOTE — Telephone Encounter (Signed)
PT WIFE IS CALLING STATING OUR NUMBER CALLED HER I HAVE NO DOCUMENTATION ON A CALL HOWEVER I DID MAKE HER AWARE OF THE LAST MESSAGE WHERE Dr. Brigitte Pulse WOULD LIKE TO SEE PT SHE WILL MAKE APPT. IF HE AGREES TO COME IN

## 2016-06-29 ENCOUNTER — Ambulatory Visit: Payer: Self-pay

## 2016-06-29 ENCOUNTER — Telehealth: Payer: Self-pay

## 2016-06-29 MED ORDER — LIDOCAINE 5 % EX OINT: 1.0000 "application " | TOPICAL_OINTMENT | CUTANEOUS | 0 refills | Status: DC | PRN

## 2016-06-29 NOTE — Telephone Encounter (Signed)
Pt. Cancelled appointment today. [email or web address]

## 2016-06-29 NOTE — Telephone Encounter (Signed)
Tried to call patient.  LMVM to return call.

## 2016-06-29 NOTE — Telephone Encounter (Signed)
Tried to call patient but there was no answer.

## 2016-06-29 NOTE — Telephone Encounter (Signed)
Called pt's wife Benjamine Mola [phone] who stated that Alexander Wolf was unable to obtain the lidocaine as it does not come in a 5% jelly so he was to worried to come in for his hemorrhoidectomy today - called in ointment to [facility] and he will come in next MOn/Tues for this.  Reminded that coming in to see one of my colleagues tomorrow would be fine.

## 2016-06-29 NOTE — Telephone Encounter (Signed)
Wife call stating that she has to cancell appt for today and that she couldn't get lidocaine RX cause insurance want cover it and if Dr Brigitte Pulse can send the pharmacy an ointment or another kind of gel please call wife at [phone]

## 2016-07-03 ENCOUNTER — Ambulatory Visit: Payer: 59 | Admitting: Oncology

## 2016-07-03 ENCOUNTER — Other Ambulatory Visit: Payer: 59

## 2016-07-03 NOTE — Telephone Encounter (Signed)
Pt came in for appt. 

## 2016-07-10 ENCOUNTER — Telehealth: Payer: Self-pay | Admitting: Oncology

## 2016-07-10 NOTE — Telephone Encounter (Signed)
Wife called to reschedule appointments. Patient requested to have flush appointments scheduled sooner than MD appointment.

## 2016-07-11 ENCOUNTER — Other Ambulatory Visit: Payer: Self-pay | Admitting: Family Medicine

## 2016-07-11 DIAGNOSIS — E119 Type 2 diabetes mellitus without complications: Secondary | ICD-10-CM

## 2016-07-12 ENCOUNTER — Emergency Department (HOSPITAL_COMMUNITY): Payer: 59

## 2016-07-12 ENCOUNTER — Encounter (HOSPITAL_COMMUNITY): Payer: Self-pay | Admitting: Emergency Medicine

## 2016-07-12 ENCOUNTER — Other Ambulatory Visit: Payer: Self-pay

## 2016-07-12 ENCOUNTER — Other Ambulatory Visit (HOSPITAL_BASED_OUTPATIENT_CLINIC_OR_DEPARTMENT_OTHER): Payer: 59

## 2016-07-12 ENCOUNTER — Other Ambulatory Visit (HOSPITAL_COMMUNITY): Payer: Self-pay

## 2016-07-12 ENCOUNTER — Telehealth: Payer: Self-pay | Admitting: *Deleted

## 2016-07-12 ENCOUNTER — Inpatient Hospital Stay (HOSPITAL_COMMUNITY): Payer: 59

## 2016-07-12 ENCOUNTER — Ambulatory Visit (HOSPITAL_BASED_OUTPATIENT_CLINIC_OR_DEPARTMENT_OTHER): Payer: 59

## 2016-07-12 ENCOUNTER — Inpatient Hospital Stay (HOSPITAL_COMMUNITY)
Admission: EM | Admit: 2016-07-12 | Discharge: 2016-07-19 | DRG: 682 | Disposition: A | Discharge status: Home / Self Care | Payer: 59 | Attending: Internal Medicine | Admitting: Internal Medicine

## 2016-07-12 DIAGNOSIS — N139 Obstructive and reflux uropathy, unspecified: Secondary | ICD-10-CM | POA: Diagnosis present

## 2016-07-12 DIAGNOSIS — C799 Secondary malignant neoplasm of unspecified site: Secondary | ICD-10-CM

## 2016-07-12 DIAGNOSIS — E872 Acidosis: Secondary | ICD-10-CM | POA: Diagnosis present

## 2016-07-12 DIAGNOSIS — Z8546 Personal history of malignant neoplasm of prostate: Secondary | ICD-10-CM

## 2016-07-12 DIAGNOSIS — Z7984 Long term (current) use of oral hypoglycemic drugs: Secondary | ICD-10-CM

## 2016-07-12 DIAGNOSIS — K5900 Constipation, unspecified: Secondary | ICD-10-CM | POA: Diagnosis present

## 2016-07-12 DIAGNOSIS — I10 Essential (primary) hypertension: Secondary | ICD-10-CM | POA: Diagnosis present

## 2016-07-12 DIAGNOSIS — E860 Dehydration: Secondary | ICD-10-CM | POA: Diagnosis present

## 2016-07-12 DIAGNOSIS — C675 Malignant neoplasm of bladder neck: Secondary | ICD-10-CM

## 2016-07-12 DIAGNOSIS — C679 Malignant neoplasm of bladder, unspecified: Secondary | ICD-10-CM | POA: Diagnosis present

## 2016-07-12 DIAGNOSIS — Z9221 Personal history of antineoplastic chemotherapy: Secondary | ICD-10-CM

## 2016-07-12 DIAGNOSIS — E875 Hyperkalemia: Secondary | ICD-10-CM | POA: Diagnosis present

## 2016-07-12 DIAGNOSIS — Z95828 Presence of other vascular implants and grafts: Secondary | ICD-10-CM

## 2016-07-12 DIAGNOSIS — I2510 Atherosclerotic heart disease of native coronary artery without angina pectoris: Secondary | ICD-10-CM | POA: Diagnosis present

## 2016-07-12 DIAGNOSIS — G9341 Metabolic encephalopathy: Secondary | ICD-10-CM | POA: Diagnosis present

## 2016-07-12 DIAGNOSIS — R7989 Other specified abnormal findings of blood chemistry: Secondary | ICD-10-CM | POA: Diagnosis present

## 2016-07-12 DIAGNOSIS — Z515 Encounter for palliative care: Secondary | ICD-10-CM

## 2016-07-12 DIAGNOSIS — N132 Hydronephrosis with renal and ureteral calculous obstruction: Secondary | ICD-10-CM | POA: Diagnosis present

## 2016-07-12 DIAGNOSIS — D638 Anemia in other chronic diseases classified elsewhere: Secondary | ICD-10-CM | POA: Diagnosis present

## 2016-07-12 DIAGNOSIS — E785 Hyperlipidemia, unspecified: Secondary | ICD-10-CM | POA: Diagnosis present

## 2016-07-12 DIAGNOSIS — C674 Malignant neoplasm of posterior wall of bladder: Secondary | ICD-10-CM | POA: Diagnosis present

## 2016-07-12 DIAGNOSIS — G4700 Insomnia, unspecified: Secondary | ICD-10-CM | POA: Diagnosis present

## 2016-07-12 DIAGNOSIS — G894 Chronic pain syndrome: Secondary | ICD-10-CM | POA: Diagnosis present

## 2016-07-12 DIAGNOSIS — G893 Neoplasm related pain (acute) (chronic): Secondary | ICD-10-CM | POA: Diagnosis not present

## 2016-07-12 DIAGNOSIS — K5903 Drug induced constipation: Secondary | ICD-10-CM | POA: Diagnosis not present

## 2016-07-12 DIAGNOSIS — K219 Gastro-esophageal reflux disease without esophagitis: Secondary | ICD-10-CM | POA: Diagnosis present

## 2016-07-12 DIAGNOSIS — Z7189 Other specified counseling: Secondary | ICD-10-CM

## 2016-07-12 DIAGNOSIS — N179 Acute kidney failure, unspecified: Secondary | ICD-10-CM | POA: Diagnosis present

## 2016-07-12 DIAGNOSIS — F419 Anxiety disorder, unspecified: Secondary | ICD-10-CM | POA: Diagnosis present

## 2016-07-12 DIAGNOSIS — R627 Adult failure to thrive: Secondary | ICD-10-CM | POA: Diagnosis present

## 2016-07-12 DIAGNOSIS — Z79899 Other long term (current) drug therapy: Secondary | ICD-10-CM

## 2016-07-12 DIAGNOSIS — N281 Cyst of kidney, acquired: Secondary | ICD-10-CM | POA: Diagnosis present

## 2016-07-12 DIAGNOSIS — N19 Unspecified kidney failure: Secondary | ICD-10-CM

## 2016-07-12 DIAGNOSIS — E876 Hypokalemia: Secondary | ICD-10-CM | POA: Diagnosis present

## 2016-07-12 DIAGNOSIS — D630 Anemia in neoplastic disease: Secondary | ICD-10-CM | POA: Diagnosis present

## 2016-07-12 DIAGNOSIS — R9431 Abnormal electrocardiogram [ECG] [EKG]: Secondary | ICD-10-CM | POA: Diagnosis present

## 2016-07-12 DIAGNOSIS — E119 Type 2 diabetes mellitus without complications: Secondary | ICD-10-CM | POA: Diagnosis present

## 2016-07-12 DIAGNOSIS — Z79891 Long term (current) use of opiate analgesic: Secondary | ICD-10-CM

## 2016-07-12 DIAGNOSIS — N1330 Unspecified hydronephrosis: Secondary | ICD-10-CM | POA: Diagnosis not present

## 2016-07-12 DIAGNOSIS — Z951 Presence of aortocoronary bypass graft: Secondary | ICD-10-CM | POA: Diagnosis not present

## 2016-07-12 DIAGNOSIS — N401 Enlarged prostate with lower urinary tract symptoms: Secondary | ICD-10-CM | POA: Diagnosis present

## 2016-07-12 DIAGNOSIS — Z87891 Personal history of nicotine dependence: Secondary | ICD-10-CM

## 2016-07-12 HISTORY — PX: IR GENERIC HISTORICAL: IMG1180011

## 2016-07-12 LAB — I-STAT CHEM 8, ED
BUN: 102 mg/dL — AB (ref 6–20)
CALCIUM ION: 1.02 mmol/L — AB (ref 1.15–1.40)
CHLORIDE: 98 mmol/L — AB (ref 101–111)
GLUCOSE: 153 mg/dL — AB (ref 65–99)
HCT: 26 % — ABNORMAL LOW (ref 39.0–52.0)
Hemoglobin: 8.8 g/dL — ABNORMAL LOW (ref 13.0–17.0)
Potassium: 5.3 mmol/L — ABNORMAL HIGH (ref 3.5–5.1)
Sodium: 131 mmol/L — ABNORMAL LOW (ref 135–145)
TCO2: 16 mmol/L (ref 0–100)

## 2016-07-12 LAB — COMPREHENSIVE METABOLIC PANEL
ALT: <6 U/L (ref 0–55)
ANION GAP: 29 meq/L — AB (ref 3–11)
AST: 24 U/L (ref 5–34)
Albumin: 3 g/dL — ABNORMAL LOW (ref 3.5–5.0)
Alkaline Phosphatase: 158 U/L — ABNORMAL HIGH (ref 40–150)
BUN: 98.6 mg/dL — ABNORMAL HIGH (ref 7.0–26.0)
CALCIUM: 10 mg/dL (ref 8.4–10.4)
CHLORIDE: 96 meq/L — AB (ref 98–109)
CO2: 11 mEq/L — ABNORMAL LOW (ref 22–29)
Creatinine: 16.4 mg/dL (ref 0.7–1.3)
EGFR: 3 mL/min/{1.73_m2} — AB (ref >90)
Glucose: 58 mg/dl — ABNORMAL LOW (ref 70–140)
POTASSIUM: 6.2 meq/L — AB (ref 3.5–5.1)
Sodium: 136 mEq/L (ref 136–145)
Total Bilirubin: 0.51 mg/dL (ref 0.20–1.20)
Total Protein: 8.2 g/dL (ref 6.4–8.3)

## 2016-07-12 LAB — CBC WITH DIFFERENTIAL/PLATELET
BASO%: 0.2 % (ref 0.0–2.0)
BASOS ABS: 0 10*3/uL (ref 0.0–0.1)
Basophils Absolute: 0 10*3/uL (ref 0.0–0.1)
Basophils Relative: 0 %
EOS PCT: 0 %
EOS%: 0.2 % (ref 0.0–7.0)
Eosinophils Absolute: 0 10*3/uL (ref 0.0–0.5)
Eosinophils Absolute: 0 10*3/uL (ref 0.0–0.7)
HCT: 26.8 % — ABNORMAL LOW (ref 39.0–52.0)
HEMATOCRIT: 29.3 % — AB (ref 38.4–49.9)
HEMOGLOBIN: 8.8 g/dL — AB (ref 13.0–17.0)
HGB: 9.9 g/dL — ABNORMAL LOW (ref 13.0–17.1)
LYMPH#: 1.4 10*3/uL (ref 0.9–3.3)
LYMPH%: 11 % — AB (ref 14.0–49.0)
LYMPHS ABS: 1.1 10*3/uL (ref 0.7–4.0)
LYMPHS PCT: 11 %
MCH: 28.7 pg (ref 27.2–33.4)
MCH: 28 pg (ref 26.0–34.0)
MCHC: 33.8 g/dL (ref 32.0–36.0)
MCHC: 32.8 g/dL (ref 30.0–36.0)
MCV: 84.9 fL (ref 79.3–98.0)
MCV: 85.4 fL (ref 78.0–100.0)
MONO#: 1.3 10*3/uL — AB (ref 0.1–0.9)
MONO%: 10.2 % (ref 0.0–14.0)
Monocytes Absolute: 0.7 10*3/uL (ref 0.1–1.0)
Monocytes Relative: 7 %
NEUT#: 10.3 10*3/uL — ABNORMAL HIGH (ref 1.5–6.5)
NEUT%: 78.4 % — AB (ref 39.0–75.0)
NEUTROS PCT: 82 %
Neutro Abs: 7.7 10*3/uL (ref 1.7–7.7)
PLATELETS: 344 10*3/uL (ref 140–400)
Platelets: 291 10*3/uL (ref 150–400)
RBC: 3.45 10*6/uL — ABNORMAL LOW (ref 4.20–5.82)
RBC: 3.14 MIL/uL — AB (ref 4.22–5.81)
RDW: 17.5 % — ABNORMAL HIGH (ref 11.0–14.6)
RDW: 17.4 % — ABNORMAL HIGH (ref 11.5–15.5)
WBC: 13.1 10*3/uL — ABNORMAL HIGH (ref 4.0–10.3)
WBC: 9.5 10*3/uL (ref 4.0–10.5)

## 2016-07-12 LAB — PROTIME-INR
INR: 1.19
PROTHROMBIN TIME: 15.2 s (ref 11.4–15.2)

## 2016-07-12 LAB — CBG MONITORING, ED
GLUCOSE-CAPILLARY: 36 mg/dL — AB (ref 65–99)
Glucose-Capillary: 82 mg/dL (ref 65–99)
Glucose-Capillary: 67 mg/dL (ref 65–99)

## 2016-07-12 LAB — I-STAT CG4 LACTIC ACID, ED: Lactic Acid, Venous: 4.13 mmol/L (ref 0.5–1.9)

## 2016-07-12 MED ORDER — ONDANSETRON HCL 4 MG/2ML IJ SOLN
4.0000 mg | Freq: Once | INTRAMUSCULAR | Status: AC
  Administered 2016-07-12: 4 mg via INTRAVENOUS
  Filled 2016-07-12: qty 2

## 2016-07-12 MED ORDER — ONDANSETRON HCL 4 MG/2ML IJ SOLN
4.0000 mg | Freq: Four times a day (QID) | INTRAMUSCULAR | Status: DC | PRN
Start: 2016-07-12 — End: 2016-07-19

## 2016-07-12 MED ORDER — IOPAMIDOL (ISOVUE-300) INJECTION 61%
INTRAVENOUS | Status: AC
  Administered 2016-07-12: 20 mL
  Filled 2016-07-12: qty 50

## 2016-07-12 MED ORDER — ZOLPIDEM TARTRATE 10 MG PO TABS: 10.0000 mg | ORAL_TABLET | Freq: Every evening | ORAL | Status: DC | PRN

## 2016-07-12 MED ORDER — ATORVASTATIN CALCIUM 40 MG PO TABS
40.0000 mg | ORAL_TABLET | Freq: Every day | ORAL | Status: DC
  Administered 2016-07-13 – 2016-07-19 (×7): 40 mg via ORAL
  Filled 2016-07-12 (×7): qty 1

## 2016-07-12 MED ORDER — SODIUM CHLORIDE 0.9% FLUSH
10.0000 mL | INTRAVENOUS | Status: DC | PRN
  Administered 2016-07-12: 10 mL via INTRAVENOUS
  Filled 2016-07-12: qty 10

## 2016-07-12 MED ORDER — ACETAMINOPHEN 325 MG PO TABS: 650.0000 mg | ORAL_TABLET | Freq: Four times a day (QID) | ORAL | Status: DC | PRN

## 2016-07-12 MED ORDER — HYDROMORPHONE HCL 2 MG/ML IJ SOLN
2.0000 mg | Freq: Once | INTRAMUSCULAR | Status: AC
  Administered 2016-07-12: 2 mg via INTRAVENOUS
  Filled 2016-07-12: qty 1

## 2016-07-12 MED ORDER — ACETAMINOPHEN 650 MG RE SUPP: 650.0000 mg | Freq: Four times a day (QID) | RECTAL | Status: DC | PRN

## 2016-07-12 MED ORDER — HYDROMORPHONE HCL 1 MG/ML IJ SOLN: 1.0000 mg | INTRAMUSCULAR | Status: DC | PRN

## 2016-07-12 MED ORDER — FENTANYL CITRATE (PF) 100 MCG/2ML IJ SOLN
INTRAMUSCULAR | Status: AC
  Filled 2016-07-12: qty 6

## 2016-07-12 MED ORDER — AMITRIPTYLINE HCL 25 MG PO TABS
25.0000 mg | ORAL_TABLET | Freq: Every day | ORAL | Status: DC
  Administered 2016-07-13 – 2016-07-16 (×4): 25 mg via ORAL
  Filled 2016-07-12 (×4): qty 1

## 2016-07-12 MED ORDER — LORAZEPAM 2 MG/ML IJ SOLN
2.0000 mg | Freq: Once | INTRAMUSCULAR | Status: AC
  Administered 2016-07-12: 2 mg via INTRAVENOUS
  Filled 2016-07-12: qty 1

## 2016-07-12 MED ORDER — INSULIN ASPART 100 UNIT/ML ~~LOC~~ SOLN: 0.0000 [IU] | Freq: Three times a day (TID) | SUBCUTANEOUS | Status: DC

## 2016-07-12 MED ORDER — MIDAZOLAM HCL 2 MG/2ML IJ SOLN
INTRAMUSCULAR | Status: AC | PRN
  Administered 2016-07-12 (×4): 1 mg via INTRAVENOUS

## 2016-07-12 MED ORDER — DEXTROSE-NACL 5-0.9 % IV SOLN
Freq: Once | INTRAVENOUS | Status: AC
  Administered 2016-07-12: 18:00:00 via INTRAVENOUS

## 2016-07-12 MED ORDER — LIDOCAINE-EPINEPHRINE (PF) 2 %-1:200000 IJ SOLN
INTRAMUSCULAR | Status: AC
  Filled 2016-07-12: qty 20

## 2016-07-12 MED ORDER — POLYETHYLENE GLYCOL 3350 17 G PO PACK: 17.0000 g | PACK | Freq: Four times a day (QID) | ORAL | Status: DC | PRN

## 2016-07-12 MED ORDER — PANTOPRAZOLE SODIUM 40 MG PO TBEC
80.0000 mg | DELAYED_RELEASE_TABLET | Freq: Every day | ORAL | Status: DC
  Administered 2016-07-13 – 2016-07-19 (×7): 80 mg via ORAL
  Filled 2016-07-12 (×7): qty 2

## 2016-07-12 MED ORDER — ENOXAPARIN SODIUM 30 MG/0.3ML ~~LOC~~ SOLN
30.0000 mg | SUBCUTANEOUS | Status: DC
  Administered 2016-07-13 – 2016-07-16 (×4): 30 mg via SUBCUTANEOUS
  Filled 2016-07-12 (×4): qty 0.3

## 2016-07-12 MED ORDER — SODIUM CHLORIDE 0.9% FLUSH
3.0000 mL | Freq: Two times a day (BID) | INTRAVENOUS | Status: DC
Start: 2016-07-13 — End: 2016-07-19
  Administered 2016-07-13 – 2016-07-17 (×5): 3 mL via INTRAVENOUS

## 2016-07-12 MED ORDER — IOPAMIDOL (ISOVUE-300) INJECTION 61%
INTRAVENOUS | Status: AC
  Filled 2016-07-12: qty 30

## 2016-07-12 MED ORDER — FENTANYL CITRATE (PF) 100 MCG/2ML IJ SOLN
INTRAMUSCULAR | Status: AC | PRN
  Administered 2016-07-12 (×2): 50 ug via INTRAVENOUS

## 2016-07-12 MED ORDER — DEXTROSE 50 % IV SOLN
INTRAVENOUS | Status: AC
  Administered 2016-07-12: 18:00:00
  Filled 2016-07-12: qty 50

## 2016-07-12 MED ORDER — HYDROMORPHONE HCL 1 MG/ML IJ SOLN
1.0000 mg | Freq: Once | INTRAMUSCULAR | Status: AC
  Administered 2016-07-12: 1 mg via INTRAVENOUS
  Filled 2016-07-12: qty 1

## 2016-07-12 MED ORDER — ONDANSETRON HCL 4 MG PO TABS: 4.0000 mg | ORAL_TABLET | Freq: Four times a day (QID) | ORAL | Status: DC | PRN

## 2016-07-12 MED ORDER — MIDAZOLAM HCL 2 MG/2ML IJ SOLN
INTRAMUSCULAR | Status: AC
  Filled 2016-07-12: qty 6

## 2016-07-12 MED ORDER — METOPROLOL TARTRATE 25 MG PO TABS
25.0000 mg | ORAL_TABLET | Freq: Two times a day (BID) | ORAL | Status: DC
  Administered 2016-07-13: 25 mg via ORAL
  Filled 2016-07-12: qty 1

## 2016-07-12 MED ORDER — DEXTROSE 50 % IV SOLN
50.0000 mL | Freq: Once | INTRAVENOUS | Status: AC
  Administered 2016-07-12: 50 mL via INTRAVENOUS
  Filled 2016-07-12: qty 50

## 2016-07-12 MED ORDER — LORAZEPAM 2 MG/ML IJ SOLN
2.0000 mg | INTRAMUSCULAR | Status: DC | PRN
  Administered 2016-07-13 – 2016-07-14 (×6): 2 mg via INTRAVENOUS
  Filled 2016-07-12 (×6): qty 1

## 2016-07-12 MED ORDER — DOCUSATE SODIUM 100 MG PO CAPS
100.0000 mg | ORAL_CAPSULE | Freq: Two times a day (BID) | ORAL | Status: DC
  Administered 2016-07-13 – 2016-07-14 (×4): 100 mg via ORAL
  Filled 2016-07-12 (×4): qty 1

## 2016-07-12 MED ORDER — CIPROFLOXACIN IN D5W 400 MG/200ML IV SOLN
400.0000 mg | Freq: Once | INTRAVENOUS | Status: AC
  Administered 2016-07-12: 400 mg via INTRAVENOUS
  Filled 2016-07-12: qty 200

## 2016-07-12 MED ORDER — ALBUTEROL SULFATE (2.5 MG/3ML) 0.083% IN NEBU
10.0000 mg | INHALATION_SOLUTION | Freq: Once | RESPIRATORY_TRACT | Status: AC
  Administered 2016-07-12: 10 mg via RESPIRATORY_TRACT
  Filled 2016-07-12 (×2): qty 12

## 2016-07-12 MED ORDER — LIDOCAINE-EPINEPHRINE (PF) 2 %-1:200000 IJ SOLN
INTRAMUSCULAR | Status: DC | PRN
  Administered 2016-07-12: 20 mL

## 2016-07-12 MED ORDER — SODIUM CHLORIDE 0.9 % IV BOLUS (SEPSIS)
500.0000 mL | Freq: Once | INTRAVENOUS | Status: AC
  Administered 2016-07-12: 500 mL via INTRAVENOUS

## 2016-07-12 MED ORDER — HEPARIN SOD (PORK) LOCK FLUSH 100 UNIT/ML IV SOLN
500.0000 [IU] | Freq: Once | INTRAVENOUS | Status: AC | PRN
  Administered 2016-07-12: 500 [IU] via INTRAVENOUS
  Filled 2016-07-12: qty 5

## 2016-07-12 NOTE — Procedures (Signed)
Successful [place] and fluoroscopic guided placement of bilateral PCNs with ends coiled and locked within the bilateral renal pelvis. Both PCNs connected to gravity bags. EBL: Minimal No immediate post procedural complications.   Ronny Bacon, MD Pager #: [phone]

## 2016-07-12 NOTE — H&P (Signed)
History and Physical    Alexander Wolf [id number] DOB: 1956-03-13 DOA: 07/12/2016  PCP: [name], MD Consultants:  Alen Blew - onc; [facility] - Tammi Klippel Patient coming from: home - lives with wife and granddaughter; NOK: wife, [phone]  Chief Complaint: abnormal labs  HPI: Alexander Wolf is a 60 y.o. male with medical history significant of bladder cancer, DM, CAD s/p CABG, HTN, and HLD presenting with abnormal labs.   Patient with routine labs today, K+ and creatinine elevated.  Vomiting, inability to urinate.  Inability to urinate since Monday definitely, but wife is unsure if maybe longer.  Intermittent vomiting for the last week.  +confusion, waxing and waning for over a month now.  Thrombosed hemorrhoid with pain, leg pain, anxiety.   ED Course: Per Dr. Johnney Killian:  Patient has been having increasing rectal and lower abdominal pain. He has known history of prostate cancer. Patient was referred with abnormal lab work. Findings are consistent with renal failure in CT scan shows significant increase in tumor burden. Patient is ill with severe rectal pain and kidney failure. Plan to admit.      Review of Systems: unable to perform due to patient's uremia.   Ambulatory Status:  Ambulates without assistance  Past Medical History:  Diagnosis Date  . Bladder cancer ([place]) 2014   [facility] in [place], [place]; /  urologist in [place]-  dr Diona Fanti; lost to f/u, redeveloped problems in 7/17  . BPH (benign prostatic hypertrophy) with urinary retention   . Coronary artery disease    s/p CABG  (per pt hasn't seen cardiologist for several years)  . Foley catheter in place   . GERD (gastroesophageal reflux disease)   . Hyperlipidemia   . Hypertension   . Insomnia   . 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 Type 2 diabetes mellitus ([place])     Past Surgical History:  Procedure Laterality Date  . CARDIAC CATHETERIZATION  10/26/2003  dr Leonia Reeves   ETT + ischemia/  severe 3 vessel avcad,  normal  LVF, ef 55-60%  . CORONARY ARTERY BYPASS GRAFT  10-28-2003   dr Ricard Dillon   LIMA to dLAD,  RIMA to OM1,  SVG to OM2,  SVG to  right posterolateral   . CYSTOSCOPY N/A 03/29/2016   Procedure: CYSTOSCOPY;  Surgeon: Franchot Gallo, MD;  [facility];  Service: Urology;  Laterality: N/A;  . IR GENERIC HISTORICAL  04/19/2016   IR US GUIDE VASC ACCESS RIGHT 04/19/2016 Corrie Mckusick, DO [facility]  . IR GENERIC HISTORICAL  04/19/2016   IR FLUORO GUIDE PORT INSERTION RIGHT 04/19/2016 Corrie Mckusick, DO [facility]  . TONSILLECTOMY  as child  . TRANSURETHRAL RESECTION OF BLADDER TUMOR  2014  . TRANSURETHRAL RESECTION OF BLADDER TUMOR WITH GYRUS (TURBT-GYRUS) N/A 03/29/2016   Procedure: TRANSURETHRAL RESECTION OF BLADDER TUMOR WITH GYRUS (TURBT-GYRUS);  Surgeon: Franchot Gallo, MD;  Location: [facility];  Service: Urology;  Laterality: N/A;  . TRANSURETHRAL RESECTION OF PROSTATE N/A 03/29/2016   Procedure: TRANSURETHRAL RESECTION OF THE PROSTATE (TURP);  Surgeon: Franchot Gallo, MD;  [facility];  Service: Urology;  Laterality: N/A;    Social History   Social History  . Marital status: Married    Spouse name: N/A  . Number of children: 3  . Years of education: N/A   Occupational History  . unemployed    Social History Main Topics  . Smoking status: Former Smoker    Packs/day: 1.00  Years: 44.00    Types: Cigarettes    Quit date: 03/22/2012  . Smokeless tobacco: Never Used  . Alcohol use No  . Drug use: No  . Sexual activity: Yes   Other Topics Concern  . Not on file   Social History Narrative   Marital status: married x 12 years; second marriage.      Children: 3 children; 3 grandchildren; no gg      Lives: with wife      Employment:  Building control surveyor; Architect; 40 hours per week      Tobacco: quit in 3 years ago in 2013; smoked 45 years      Alcohol:  None      Drugs:  None      Exercise: works hard every day       Seatbelt:  100%; no texting while driving         No Known Allergies  Family History  Problem Relation Age of Onset  . Varicose Veins Mother     Prior to Admission medications   Medication Sig Start Date End Date Taking? Authorizing Provider  amitriptyline (ELAVIL) 25 MG tablet Take 1 tablet (25 mg total) by mouth at bedtime. 06/13/16  Yes Shawnee Knapp, MD  atorvastatin (LIPITOR) 40 MG tablet Take 1 tablet (40 mg total) by mouth daily. 03/29/16  Yes Wardell Honour, MD  diazepam (VALIUM) 5 MG tablet Take 1 tab by mouth on an empty stomach 2 hrs prior to procedure. Repeat in 60 min if needed 06/28/16  Yes Shawnee Knapp, MD  docusate sodium (COLACE) 100 MG capsule Take 1 capsule (100 mg total) by mouth 2 (two) times daily as needed for mild constipation. Patient taking differently: Take 100 mg by mouth 2 (two) times daily.  04/07/16  Yes Shawnee Knapp, MD  glipiZIDE (GLUCOTROL) 5 MG tablet Take 1 tablet (5 mg total) by mouth 2 (two) times daily before a meal. 05/27/15  Yes Wardell Honour, MD  lidocaine (XYLOCAINE) 5 % ointment Apply 1 application topically as needed. Patient taking differently: Apply 1 application topically as needed for mild pain.  06/29/16  Yes Shawnee Knapp, MD  lidocaine-prilocaine (EMLA) cream Apply to port before chemotherapy. 04/10/16  Yes Wyatt Portela, MD  lisinopril (PRINIVIL,ZESTRIL) 20 MG tablet Take 1 tablet (20 mg total) by mouth daily. 04/04/16  Yes Wardell Honour, MD  metFORMIN (GLUCOPHAGE) 1000 MG tablet Take 1 tablet (1,000 mg total) by mouth 2 (two) times daily with a meal. 04/11/16  Yes Tereasa Coop, PA-C  metoprolol (LOPRESSOR) 50 MG tablet TAKE 1/2 TABLET BY MOUTH TWICE DAILY 06/13/16  Yes Shawnee Knapp, MD  Omega-3 Fatty Acids (FISH OIL) 300 MG CAPS Take 600 capsules by mouth 2 (two) times daily.    Yes Historical Provider, MD  omeprazole (PRILOSEC) 40 MG capsule TAKE 1 CAPSULE BY MOUTH EVERY DAY 03/29/16  Yes Wardell Honour, MD  oxyCODONE-acetaminophen (PERCOCET) 10-325  MG tablet Take 1-2 tablets by mouth every 4 (four) hours as needed for pain. 06/28/16  Yes Shawnee Knapp, MD  polyethylene glycol powder (GLYCOLAX/MIRALAX) powder Take 17 g by mouth 4 (four) times daily as needed. Patient taking differently: Take 17 g by mouth 4 (four) times daily as needed for mild constipation.  04/14/16  Yes Shawnee Knapp, MD  prochlorperazine (COMPAZINE) 10 MG tablet Take 1 tablet (10 mg total) by mouth every 6 (six) hours as needed for nausea or vomiting. 06/14/16  Yes  Wyatt Portela, MD  zolpidem (AMBIEN) 10 MG tablet TAKE 1 TABLET BY MOUTH EVERY NIGHT AT BEDTIME AS NEEDED FOR SLEEP 06/05/16  Yes Shawnee Knapp, MD  Lidocaine, Anorectal, 5 % GEL Apply 1 inch topically every 30 (thirty) minutes as needed. Patient not taking: Reported on 07/12/2016 06/28/16   Shawnee Knapp, MD    Physical Exam: Vitals:   07/13/16 0130 07/13/16 0200 07/13/16 0230 07/13/16 0300  BP: (!) 110/59 100/63 113/60 114/68  Pulse: (!) 113 (!) 113 (!) 111 (!) 109  Resp: 12 10 11 13   Temp:      TempSrc:      SpO2: 97% 97% 97% 97%  Weight:         General:Patient extremely agitated, confused, wandering around the bedside, unable to get comfortable Eyes:  PERRL, EOMI, normal lids, iris ENT:  grossly normal hearing, lips & tongue, mmm Neck:  no LAD, masses or thyromegaly Cardiovascular:  tachycardia, no m/r/g. No LE edema.  Respiratory: CTA bilaterally, no w/r/r. Normal respiratory effort. Abdomen: soft, nt (other than very mild tenderness in suprapubic region) ,nd, NABS Skin:  no rash or induration seen on limited exam Musculoskeletal:  grossly normal tone BUE/BLE, good ROM, no bony abnormality Psychiatric: confused, very agitated and uncomfortable Neurologic: unable to perform  Labs on Admission: I have personally reviewed following labs and imaging studies  CBC:  Recent Labs Lab 07/12/16 1132 07/12/16 1757 07/12/16 1806  WBC 13.1* 9.5  --   NEUTROABS 10.3* 7.7  --   HGB 9.9* 8.8* 8.8*  HCT 29.3*  26.8* 26.0*  MCV 84.9 85.4  --   PLT 344 291  --    Basic Metabolic Panel:  Recent Labs Lab 07/12/16 1132 07/12/16 1757 07/12/16 1806 07/13/16 0027  NA 136 134* 131* 136  K 6.2* 5.4* 5.3* 4.4  CL  --  97* 98* 98*  CO2 11* 16*  --  16*  GLUCOSE 58* 164* 153* 60*  BUN 98.6* 103* 102* 99*  CREATININE 16.4* 16.78* >18.00* 15.42*  CALCIUM 10.0 8.4*  --  8.3*   GFR: Estimated Creatinine Clearance: 5.9 mL/min (by C-G formula based on SCr of 15.42 mg/dL (H)). Liver Function Tests:  Recent Labs Lab 07/12/16 1132 07/12/16 1757  AST 24 24  ALT <6 7*  ALKPHOS 158* 111  BILITOT 0.51 0.8  PROT 8.2 7.5  ALBUMIN 3.0* 3.3*   No results for input(s): LIPASE, AMYLASE in the last 168 hours. No results for input(s): AMMONIA in the last 168 hours. Coagulation Profile:  Recent Labs Lab 07/12/16 1757  INR 1.19   Cardiac Enzymes: No results for input(s): CKTOTAL, CKMB, CKMBINDEX, TROPONINI in the last 168 hours. BNP (last 3 results) No results for input(s): PROBNP in the last 8760 hours. HbA1C: No results for input(s): HGBA1C in the last 72 hours. CBG:  Recent Labs Lab 07/12/16 1737 07/12/16 1834 07/12/16 1957  GLUCAP 36* 82 67   Lipid Profile: No results for input(s): CHOL, HDL, LDLCALC, TRIG, CHOLHDL, LDLDIRECT in the last 72 hours. Thyroid Function Tests: No results for input(s): TSH, T4TOTAL, FREET4, T3FREE, THYROIDAB in the last 72 hours. Anemia Panel: No results for input(s): VITAMINB12, FOLATE, FERRITIN, TIBC, IRON, RETICCTPCT in the last 72 hours. Urine analysis:    Component Value Date/Time   COLORURINE YELLOW 03/10/2016 0941   APPEARANCEUR HAZY (A) 03/10/2016 0941   LABSPEC 1.017 03/10/2016 0941   PHURINE 6.0 03/10/2016 0941   GLUCOSEU 100 (A) 03/10/2016 0941   HGBUR LARGE (A)  03/10/2016 0941   BILIRUBINUR negative 04/07/2016 1446   BILIRUBINUR neg 09/24/2014 0851   KETONESUR negative 04/07/2016 1446   KETONESUR NEGATIVE 03/10/2016 0941   PROTEINUR =100  (A) 04/07/2016 1446   PROTEINUR 100 (A) 03/10/2016 0941   UROBILINOGEN 0.2 04/07/2016 1446   NITRITE Negative 04/07/2016 1446   NITRITE NEGATIVE 03/10/2016 0941   LEUKOCYTESUR Trace (A) 04/07/2016 1446    Creatinine Clearance: Estimated Creatinine Clearance: 5.9 mL/min (by C-G formula based on SCr of 15.42 mg/dL (H)).  Sepsis Labs: @LABRCNTIP (procalcitonin:4,lacticidven:4) )No results found for this or any previous visit (from the past 240 hour(s)).   Radiological Exams on Admission: Ct Abdomen Pelvis Wo Contrast  Result Date: 07/12/2016 CLINICAL DATA:  60 year old male with acute renal failure, anuria and constipation for 7 days. Patient with bladder cancer, currently on chemotherapy. EXAM: CT ABDOMEN AND PELVIS WITHOUT CONTRAST TECHNIQUE: Multidetector CT imaging of the abdomen and pelvis was performed following the standard protocol without IV contrast. COMPARISON:  03/19/2016 FINDINGS: Please note that parenchymal abnormalities may be missed without intravenous contrast. Lower chest: No acute abnormality. Hepatobiliary: A right hepatic cyst is again noted. No other hepatic or gallbladder abnormalities identified. There is no evidence of biliary dilatation. Pancreas: Unremarkable Spleen: Unremarkable Adrenals/Urinary Tract: New moderate to severe bilateral hydroureteronephrosis to the bladder is present. This is caused by an enlarging bladder mass, now measuring 3.7 x 8.3 cm with new masses/adenopathy throughout the pelvis. A conglomerate mass posterior to the bladder measures 6.5 x 11.1 cm. The adrenal glands are unremarkable. Stomach/Bowel: No bowel obstruction or focal bowel wall thickening. Vascular/Lymphatic: Aortic atherosclerotic calcifications noted without aneurysm. New lymphadenopathy throughout the pelvis identified. Other: No free fluid or pneumoperitoneum. Musculoskeletal: No acute bony abnormality. IMPRESSION: New moderate to severe bilateral hydronephrosis from significantly  enlarged bladder mass/malignancy and metastatic disease/lymphadenopathy within the pelvis. Abdominal aortic atherosclerosis. Electronically Signed   By: Margarette Canada M.D.   On: 07/12/2016 19:21    EKG: Independently reviewed.  Sinus tachycardia with rate 121; nonspecific ST changes with concern for acute ischemia  Assessment/Plan Principal Problem:   Acute renal failure (ARF) (HCC) Active Problems:   Malignant neoplasm of urinary bladder (HCC)   Nonspecific abnormal electrocardiogram (ECG) (EKG)   Hyperkalemia   Elevated lactic acid level   Uremia   Acute bilateral obstructive uropathy   Anemia of chronic disease   Hypocalcemia   Acute renal failure -Patient with acute renal failure as a result of obstructive uropathy -Urology and IR consulted by the ER and the initial plan was for intervention in the AM -However, based on the patient's altered sensorium which was thought to be the result of uremia as well as tachycardia and elevated lactate which was unable to be cleared in the setting of the patient not making urine and finally with his hyperkalemia which was also unable to be effectively treated - Dr. Pascal Lux was re-contacted and asked to come in for the procedure emergently.  He graciously orchestrated his team and came in and successfully completed the procedure. -The obstructing neoplasms remain, but at least now the patient does not appear to be at risk of imminent death. -He is admitted to the ICU overnight due to the severity of his underlying problems and potential for negative outcomes - particularly while awaiting improvement from nephrostomy tube placement. -Anticipate improvement in potassium, creatinine, and lactate following placement of nephrostomy tubes.  Malignant bladder neoplasm -Per wife's report, the patient was initially diagnosed and treated in [facility] and then was lost  to f/u until July -It appears that he now has a more aggressive disease -The "thrombosed  hemorrhoid" pain that he has been experiencing is actually likely the softball-sized tumor adjacent to his rectum -Dr. Alen Blew has been added to the treatment team but likely needs to be called in the AM -Now that the emergent consideration has been dealt with, his cancer will need to be addressed quickly to see if there are salvage therapies available and/or whether rad onc would be of assistance -Unfortunately, the prognosis appears to be poor in this situation  Abnormal EKG/Hyperkalemia -The patient has hyperkalemia upon presentation as well as tachycardia -His initial EKG was abnormal -Now that the underlying issue has been resolved (for now) and the hyperkalemia is resolved, will repeat EKG. -Will add serial troponins, as well; demand ischemia would not be surprising given the acute illness the patient was facing on presentation - although he does have h/o CABG and so ischemia is also of concern  Anemia of chronic disease  -Hgb 8.8, prior 11.6 on 10/31 -Will trend  Hypocalcemia -Replete with calcium gluconate  DVT prophylaxis:  Lovenox - after placement of nephrostomy tubes Code Status:  Full - confirmed with family Family Communication: Wife (nurse) present throughout evaluation  Disposition Plan:  Home once clinically improved Consults called: Oncology and urology in AM; IR tonight (greatly appreciate Dr. Pascal Lux' assistance!)  Admission status: Admit - It is my clinical opinion that admission to INPATIENT is reasonable and necessary because this patient will require at least 2 midnights in the hospital to treat this condition based on the medical complexity of the problems presented.  Given the aforementioned information, the predictability of an adverse outcome is felt to be significant.    Karmen Bongo MD [facility]  If 7PM-7AM, please contact night-coverage [email or web address] Password [email or web address]  07/13/2016, 3:14 AM

## 2016-07-12 NOTE — ED Triage Notes (Signed)
Pt sent for abnormal labs. Was having routine labs drawn, reports no symptoms. C/o ongoing abdominal "gassiness," wife reports pt has some confusion but may be side effect of chemo. Currently getting IV chemotherapy for bladder cancer.

## 2016-07-12 NOTE — ED Provider Notes (Signed)
[facility] DEPT Provider Note   CSN: [id number] Arrival date & time: 07/12/16  1711     History   Chief Complaint Chief Complaint  Patient presents with  . Abnormal Lab    HPI Alexander Wolf is a 60 y.o. male.  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  Last chemo Oct 31.  P/W 3-7 days of dry heaving and no urinary output and no bowel movement.  Pt notes his abdomen is "starting" to hurt.  Decreased PO intake.  Wife notes she is unsure if he is confused or if the time frame is accurate.  No fevers, cough, SOB, CP.    Past Medical History:  Diagnosis Date  . Bladder cancer ([place]) 2014   [facility] in [place], [place]; /  urologist in [place]-  dr Diona Fanti; lost to f/u, redeveloped problems in 7/17  . BPH (benign prostatic hypertrophy) with urinary retention   . Coronary artery disease    s/p CABG  (per pt hasn't seen cardiologist for several years)  . Foley catheter in place   . GERD (gastroesophageal reflux disease)   . Hyperlipidemia   . Hypertension   . Insomnia   . 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 Type 2 diabetes mellitus [facility])     Patient Active Problem List   Diagnosis Date Noted  . Acute renal failure (ARF) ([place]) 07/12/2016  . Port catheter in place 06/05/2016  . Constipation 05/30/2016  . Malignant neoplasm of urinary bladder ([place]) 04/10/2016  . Nodular prostate with urinary obstruction 03/30/2016  . BPH (benign prostatic hypertrophy) with urinary retention 03/29/2016  . Coronary artery disease involving coronary bypass graft of native heart without angina pectoris 04/04/2014  . Essential hypertension 04/04/2014  . Hyperlipidemia LDL goal <70 04/04/2014  . Type 2 diabetes mellitus not at goal [facility]) 04/04/2014  . Hx of bladder cancer 04/04/2014    Past Surgical History:  Procedure Laterality Date  . CARDIAC CATHETERIZATION  10/26/2003  dr Leonia Reeves   ETT + ischemia/  severe 3 vessel avcad,  normal  LVF, ef 55-60%  . CORONARY ARTERY BYPASS GRAFT  10-28-2003   dr Ricard Dillon   LIMA to dLAD,  RIMA to OM1,  SVG to OM2,  SVG to  right posterolateral   . CYSTOSCOPY N/A 03/29/2016   Procedure: CYSTOSCOPY;  Surgeon: Franchot Gallo, MD;  Location[facility];  Service: Urology;  Laterality: N/A;  . IR GENERIC HISTORICAL  04/19/2016   IR US GUIDE VASC ACCESS RIGHT 04/19/2016 Corrie Mckusick, DO [facility]-INTERV RAD  . IR GENERIC HISTORICAL  04/19/2016   IR FLUORO GUIDE PORT INSERTION RIGHT 04/19/2016 Corrie Mckusick, DO [facility]-INTER[facility]  . TONSILLECTOMY  as child  . TRANSURETHRAL RESECTION OF BLADDER TUMOR  2014  . TRANSURETHRAL RESECTION OF BLADDER TUMOR WITH GYRUS (TURBT-GYRUS) N/A 03/29/2016   Procedure: TRANSURETHRAL RESECTION OF BLADDER TUMOR WITH GYRUS (TURBT-GYRUS);  Surgeon: Franchot Gallo, MD;  Location[facility];  Service: Urology;  Laterality: N/A;  . TRANSURETHRAL RESECTION OF PROSTATE N/A 03/29/2016   Procedure: TRANSURETHRAL RESECTION OF THE PROSTATE (TURP);  Surgeon: Franchot Gallo, MD;  Location: [facility];  Service: Urology;  Laterality: N/A;       Home Medications    Prior to Admission medications   Medication Sig Start Date End Date Taking? Authorizing Provider  amitriptyline (ELAVIL) 25 MG tablet Take 1 tablet (25 mg total) by mouth at  bedtime. 06/13/16  Yes Shawnee Knapp, MD  atorvastatin (LIPITOR) 40 MG tablet Take 1 tablet (40 mg total) by mouth daily. 03/29/16  Yes Wardell Honour, MD  diazepam (VALIUM) 5 MG tablet Take 1 tab by mouth on an empty stomach 2 hrs prior to procedure. Repeat in 60 min if needed 06/28/16  Yes Shawnee Knapp, MD  docusate sodium (COLACE) 100 MG capsule Take 1 capsule (100 mg total) by mouth 2 (two) times daily as needed for mild constipation. Patient taking differently: Take 100 mg by mouth 2 (two) times daily.  04/07/16  Yes Shawnee Knapp, MD  glipiZIDE (GLUCOTROL) 5 MG tablet Take 1 tablet (5 mg total) by mouth 2 (two) times  daily before a meal. 05/27/15  Yes Wardell Honour, MD  lidocaine (XYLOCAINE) 5 % ointment Apply 1 application topically as needed. Patient taking differently: Apply 1 application topically as needed for mild pain.  06/29/16  Yes Shawnee Knapp, MD  lidocaine-prilocaine (EMLA) cream Apply to port before chemotherapy. 04/10/16  Yes Wyatt Portela, MD  metFORMIN (GLUCOPHAGE) 1000 MG tablet Take 1 tablet (1,000 mg total) by mouth 2 (two) times daily with a meal. 04/11/16  Yes Tereasa Coop, PA-C  metoprolol (LOPRESSOR) 50 MG tablet TAKE 1/2 TABLET BY MOUTH TWICE DAILY 06/13/16  Yes Shawnee Knapp, MD  Omega-3 Fatty Acids (FISH OIL) 300 MG CAPS Take 600 capsules by mouth 2 (two) times daily.    Yes Historical Provider, MD  omeprazole (PRILOSEC) 40 MG capsule TAKE 1 CAPSULE BY MOUTH EVERY DAY 03/29/16  Yes Wardell Honour, MD  oxyCODONE-acetaminophen (PERCOCET) 10-325 MG tablet Take 1-2 tablets by mouth every 4 (four) hours as needed for pain. 06/28/16  Yes Shawnee Knapp, MD  polyethylene glycol powder (GLYCOLAX/MIRALAX) powder Take 17 g by mouth 4 (four) times daily as needed. Patient taking differently: Take 17 g by mouth 4 (four) times daily as needed for mild constipation.  04/14/16  Yes Shawnee Knapp, MD  prochlorperazine (COMPAZINE) 10 MG tablet Take 1 tablet (10 mg total) by mouth every 6 (six) hours as needed for nausea or vomiting. 06/14/16  Yes Wyatt Portela, MD  zolpidem (AMBIEN) 10 MG tablet TAKE 1 TABLET BY MOUTH EVERY NIGHT AT BEDTIME AS NEEDED FOR SLEEP 06/05/16  Yes Shawnee Knapp, MD    Family History Family History  Problem Relation Age of Onset  . Varicose Veins Mother     Social History Social History  Substance Use Topics  . Smoking status: Former Smoker    Packs/day: 1.00    Years: 44.00    Types: Cigarettes    Quit date: 03/22/2012  . Smokeless tobacco: Never Used  . Alcohol use No     Allergies   Patient has no known allergies.   Review of Systems Review of Systems  Unable to  perform ROS: Acuity of condition     Physical Exam Updated Vital Signs BP (!) 133/54   Pulse (!) 143   Temp 97.6 F (36.4 C) (Oral)   Resp 20   SpO2 97%   Physical Exam  Constitutional: He appears well-developed and well-nourished. No distress.  Uncomfortable appearing  HENT:  Head: Normocephalic and atraumatic.  Neck: Neck supple.  Cardiovascular: Normal rate and regular rhythm.   Pulmonary/Chest: Effort normal and breath sounds normal. No respiratory distress. He has no wheezes. He has no rales.  Abdominal: Soft. He exhibits no distension and no mass. There is no tenderness.  There is no rebound and no guarding.  Neurological: He is alert. He exhibits normal muscle tone.  Skin: He is not diaphoretic.  Nursing note and vitals reviewed.    ED Treatments / Results  Labs (all labs ordered are listed, but only abnormal results are displayed) Labs Reviewed  COMPREHENSIVE METABOLIC PANEL - Abnormal; Notable for the following:       Result Value   Sodium 134 (*)    Potassium 5.4 (*)    Chloride 97 (*)    CO2 16 (*)    Glucose, Bld 164 (*)    BUN 103 (*)    Creatinine, Ser 16.78 (*)    Calcium 8.4 (*)    Albumin 3.3 (*)    ALT 7 (*)    GFR calc non Af Amer 3 (*)    GFR calc Af Amer 3 (*)    Anion gap 21 (*)    All other components within normal limits  CBC WITH DIFFERENTIAL/PLATELET - Abnormal; Notable for the following:    RBC 3.14 (*)    Hemoglobin 8.8 (*)    HCT 26.8 (*)    RDW 17.4 (*)    All other components within normal limits  CBG MONITORING, ED - Abnormal; Notable for the following:    Glucose-Capillary 36 (*)    All other components within normal limits  I-STAT CHEM 8, ED - Abnormal; Notable for the following:    Sodium 131 (*)    Potassium 5.3 (*)    Chloride 98 (*)    BUN 102 (*)    Creatinine, Ser >18.00 (*)    Glucose, Bld 153 (*)    Calcium, Ion 1.02 (*)    Hemoglobin 8.8 (*)    HCT 26.0 (*)    All other components within normal limits    I-STAT CG4 LACTIC ACID, ED - Abnormal; Notable for the following:    Lactic Acid, Venous 4.13 (*)    All other components within normal limits  PROTIME-INR  CBG MONITORING, ED  CBG MONITORING, ED    EKG  EKG Interpretation None       Radiology Ct Abdomen Pelvis Wo Contrast  Result Date: 07/12/2016 CLINICAL DATA:  60 year old male with acute renal failure, anuria and constipation for 7 days. Patient with bladder cancer, currently on chemotherapy. EXAM: CT ABDOMEN AND PELVIS WITHOUT CONTRAST TECHNIQUE: Multidetector CT imaging of the abdomen and pelvis was performed following the standard protocol without IV contrast. COMPARISON:  03/19/2016 FINDINGS: Please note that parenchymal abnormalities may be missed without intravenous contrast. Lower chest: No acute abnormality. Hepatobiliary: A right hepatic cyst is again noted. No other hepatic or gallbladder abnormalities identified. There is no evidence of biliary dilatation. Pancreas: Unremarkable Spleen: Unremarkable Adrenals/Urinary Tract: New moderate to severe bilateral hydroureteronephrosis to the bladder is present. This is caused by an enlarging bladder mass, now measuring 3.7 x 8.3 cm with new masses/adenopathy throughout the pelvis. A conglomerate mass posterior to the bladder measures 6.5 x 11.1 cm. The adrenal glands are unremarkable. Stomach/Bowel: No bowel obstruction or focal bowel wall thickening. Vascular/Lymphatic: Aortic atherosclerotic calcifications noted without aneurysm. New lymphadenopathy throughout the pelvis identified. Other: No free fluid or pneumoperitoneum. Musculoskeletal: No acute bony abnormality. IMPRESSION: New moderate to severe bilateral hydronephrosis from significantly enlarged bladder mass/malignancy and metastatic disease/lymphadenopathy within the pelvis. Abdominal aortic atherosclerosis. Electronically Signed   By: Margarette Canada M.D.   On: 07/12/2016 19:21    Procedures Procedures (including critical care  time)  CRITICAL CARE Performed  by: Clayton Bibles   Total critical care time: 35 minutes  Critical care time was exclusive of separately billable procedures and treating other patients.  Critical care was necessary to treat or prevent imminent or life-threatening deterioration.  Critical care was time spent personally by me on the following activities: development of treatment plan with patient and/or surrogate as well as nursing, discussions with consultants, evaluation of patient's response to treatment, examination of patient, obtaining history from patient or surrogate, ordering and performing treatments and interventions, ordering and review of laboratory studies, ordering and review of radiographic studies, pulse oximetry and re-evaluation of patient's condition.   Medications Ordered in ED Medications  iopamidol (ISOVUE-300) 61 % injection (not administered)  dextrose 50 % solution (  Given 07/12/16 1740)  sodium chloride 0.9 % bolus 500 mL (0 mLs Intravenous Stopped 07/12/16 1946)  dextrose 5 %-0.9 % sodium chloride infusion ( Intravenous New Bag/Given 07/12/16 1813)  albuterol (PROVENTIL) (2.5 MG/3ML) 0.083% nebulizer solution 10 mg (10 mg Nebulization Given 07/12/16 1814)  HYDROmorphone (DILAUDID) injection 1 mg (1 mg Intravenous Given 07/12/16 1813)  ondansetron (ZOFRAN) injection 4 mg (4 mg Intravenous Given 07/12/16 1813)  HYDROmorphone (DILAUDID) injection 2 mg (2 mg Intravenous Given 07/12/16 1946)  dextrose 50 % solution 50 mL (50 mLs Intravenous Given 07/12/16 2012)  LORazepam (ATIVAN) injection 2 mg (2 mg Intravenous Given 07/12/16 2028)  ondansetron (ZOFRAN) injection 4 mg (4 mg Intravenous Given 07/12/16 2028)  HYDROmorphone (DILAUDID) injection 2 mg (2 mg Intravenous Given 07/12/16 2130)     Initial Impression / Assessment and Plan / ED Course  I have reviewed the triage vital signs and the nursing notes.  Pertinent labs & imaging results that were available during  my care of the patient were reviewed by me and considered in my medical decision making (see chart for details).  Clinical Course as of Jul 12 2145  Thu Jul 12, 2016  1756 Patient seen and examined.  Discussed patient and plan with Dr Johnney Killian who will also see the patient.    [EW]  2048 I spoke with Dr Pascal Lux, Interventional Radiology, and Dr Johnney Killian has also spoken with him.  He will speak with Dr Alyson Ingles, urology, with whom I spoke earlier, to make a plan.   [EW]    Clinical Course User Index [EW] Clayton Bibles, PA-C    Patient with known bladder cancer s/p surgery and chemotherapy, awaiting urologic surgery in January p/w 3 days of dry heaving, not urinating or defecating.  Some increasing confusion per wife.  Found to be in acute renal failure, K of 6.2 and creatinine of 17.  CT demonstrates enlarged tumor with pelvic mets causing bilateral hydronephrosis.  Dr Johnney Killian involved in patient's care throughout ED stay.  Discussed pt with Dr Alyson Ingles, urology, who advises patient needs IR to place bilateral nephrostomy tubes.  I have spoken with Dr Lorin Mercy, [facility], who accept patient for admission.  Interventional Radiologist Dr Pascal Lux will place nephrostomy tubes.  Dr Lorin Mercy and Dr Pascal Lux to arrange particulars of timing.  Pt admitted to [facility].    Final Clinical Impressions(s) / ED Diagnoses   Final diagnoses:  Acute renal failure, unspecified acute renal failure type [facility])  Bilateral hydronephrosis  Metastatic cancer ([place])  Uremia    New Prescriptions New Prescriptions   No medications on file     Clayton Bibles, PA-C 07/12/16 2147    Charlesetta Shanks, MD 07/13/16 [phone]

## 2016-07-12 NOTE — Telephone Encounter (Addendum)
"  I need to be the one listed as te contact in his records.  I also need the lab results in order to tal to the ED."  Returned call.  Voicemail  providing lab result information.  Total results have been released.  Updated demographic information.

## 2016-07-12 NOTE — Telephone Encounter (Signed)
This RN instructed patient to call [facility] because he needs to come back up here and have his labs redrawn. His labs came back abnormal. Per Dr. Alen Blew, he needs a STAT CMET and draw peripherally, not from port-a-cath. This message was left on his cell phone. Wife's cell phone I could not leave a message.

## 2016-07-12 NOTE — Telephone Encounter (Signed)
Tried calling patient again and no answer. Left message on cell phone to come back to [facility] and have labs redrawn. He needs a STAT CMET drawn peripherally, not from his port-a-cath, per Dr. Alen Blew.

## 2016-07-12 NOTE — Telephone Encounter (Signed)
Called wife's cell phone to instruct her that patient needs to have his labs redrawn today. Instructed her to call [facility].

## 2016-07-12 NOTE — ED Notes (Signed)
ED Provider at bedside. 

## 2016-07-12 NOTE — ED Notes (Signed)
Bed: WA24 Expected date:  Expected time:  Means of arrival:  Comments: TR1 

## 2016-07-12 NOTE — Telephone Encounter (Signed)
Returned call to spouse Eustaquio Maize after voicemail from her and this documentation.  "No message but I think you're calling about my husband."  Instructed to return for lab draw.  "Cannot before 5:00 pm.  I'll have to bring him tomorrow."  Notified collaborative, patient needs to come in today.  Beth reports he does not get out of bed, can't sit or stand to go to [employer].  Instructed to go to the ED and call 911 if unable to stand or sit in car.  States she "will have to convince him, he may not want to come in. This is his way of dealing with this.  He's been going downhill for a while now.  No one feels it's related to the cancer or anything else with their specialty.  He doesn't eat and vomits foamy stuff four times a day.  Has pain in his muscles and hurts when anyone tries to touch him.  He doesn't answer his phone anymore.  He just lies in bed.  I will try."

## 2016-07-13 ENCOUNTER — Other Ambulatory Visit: Payer: 59

## 2016-07-13 ENCOUNTER — Encounter (HOSPITAL_COMMUNITY): Payer: Self-pay | Admitting: Interventional Radiology

## 2016-07-13 DIAGNOSIS — N19 Unspecified kidney failure: Secondary | ICD-10-CM | POA: Diagnosis present

## 2016-07-13 DIAGNOSIS — N139 Obstructive and reflux uropathy, unspecified: Secondary | ICD-10-CM | POA: Diagnosis present

## 2016-07-13 DIAGNOSIS — R9431 Abnormal electrocardiogram [ECG] [EKG]: Secondary | ICD-10-CM | POA: Diagnosis present

## 2016-07-13 DIAGNOSIS — R7989 Other specified abnormal findings of blood chemistry: Secondary | ICD-10-CM | POA: Diagnosis present

## 2016-07-13 DIAGNOSIS — E875 Hyperkalemia: Secondary | ICD-10-CM | POA: Diagnosis present

## 2016-07-13 DIAGNOSIS — D638 Anemia in other chronic diseases classified elsewhere: Secondary | ICD-10-CM | POA: Diagnosis present

## 2016-07-13 LAB — GLUCOSE, CAPILLARY
GLUCOSE-CAPILLARY: 121 mg/dL — AB (ref 65–99)
GLUCOSE-CAPILLARY: 44 mg/dL — AB (ref 65–99)
Glucose-Capillary: 70 mg/dL (ref 65–99)
Glucose-Capillary: 46 mg/dL — ABNORMAL LOW (ref 65–99)
Glucose-Capillary: 98 mg/dL (ref 65–99)
Glucose-Capillary: 87 mg/dL (ref 65–99)
Glucose-Capillary: 311 mg/dL — ABNORMAL HIGH (ref 65–99)
Glucose-Capillary: 125 mg/dL — ABNORMAL HIGH (ref 65–99)

## 2016-07-13 LAB — BASIC METABOLIC PANEL
Anion gap: 18 — ABNORMAL HIGH (ref 5–15)
Anion gap: 22 — ABNORMAL HIGH (ref 5–15)
BUN: 98 mg/dL — AB (ref 6–20)
BUN: 99 mg/dL — ABNORMAL HIGH (ref 6–20)
CALCIUM: 8.2 mg/dL — AB (ref 8.9–10.3)
CALCIUM: 8.3 mg/dL — AB (ref 8.9–10.3)
CHLORIDE: 99 mmol/L — AB (ref 101–111)
CO2: 16 mmol/L — AB (ref 22–32)
CO2: 19 mmol/L — ABNORMAL LOW (ref 22–32)
CREATININE: 14.63 mg/dL — AB (ref 0.61–1.24)
CREATININE: 15.42 mg/dL — AB (ref 0.61–1.24)
Chloride: 98 mmol/L — ABNORMAL LOW (ref 101–111)
GFR calc Af Amer: 4 mL/min — ABNORMAL LOW (ref >60)
GFR calc non Af Amer: 3 mL/min — ABNORMAL LOW (ref >60)
GFR calc non Af Amer: 3 mL/min — ABNORMAL LOW (ref >60)
GFR, EST AFRICAN AMERICAN: 3 mL/min — AB (ref >60)
GLUCOSE: 60 mg/dL — AB (ref 65–99)
Glucose, Bld: 132 mg/dL — ABNORMAL HIGH (ref 65–99)
Potassium: 4.4 mmol/L (ref 3.5–5.1)
Potassium: 4.7 mmol/L (ref 3.5–5.1)
SODIUM: 136 mmol/L (ref 135–145)
Sodium: 136 mmol/L (ref 135–145)

## 2016-07-13 LAB — LACTIC ACID, PLASMA
LACTIC ACID, VENOUS: 2.9 mmol/L — AB (ref 0.5–1.9)
Lactic Acid, Venous: 3.3 mmol/L (ref 0.5–1.9)

## 2016-07-13 LAB — CBC
HCT: 24.5 % — ABNORMAL LOW (ref 39.0–52.0)
Hemoglobin: 8 g/dL — ABNORMAL LOW (ref 13.0–17.0)
MCH: 27.3 pg (ref 26.0–34.0)
MCHC: 32.7 g/dL (ref 30.0–36.0)
MCV: 83.6 fL (ref 78.0–100.0)
PLATELETS: 265 10*3/uL (ref 150–400)
RBC: 2.93 MIL/uL — ABNORMAL LOW (ref 4.22–5.81)
RDW: 17.4 % — AB (ref 11.5–15.5)
WBC: 9.8 10*3/uL (ref 4.0–10.5)

## 2016-07-13 LAB — COMPREHENSIVE METABOLIC PANEL
ALT: 7 U/L — ABNORMAL LOW (ref 17–63)
AST: 24 U/L (ref 15–41)
Albumin: 3.3 g/dL — ABNORMAL LOW (ref 3.5–5.0)
Alkaline Phosphatase: 111 U/L (ref 38–126)
Anion gap: 21 — ABNORMAL HIGH (ref 5–15)
BUN: 103 mg/dL — AB (ref 6–20)
CHLORIDE: 97 mmol/L — AB (ref 101–111)
CO2: 16 mmol/L — ABNORMAL LOW (ref 22–32)
Calcium: 8.4 mg/dL — ABNORMAL LOW (ref 8.9–10.3)
Creatinine, Ser: 16.78 mg/dL — ABNORMAL HIGH (ref 0.61–1.24)
GFR calc Af Amer: 3 mL/min — ABNORMAL LOW (ref >60)
GFR, EST NON AFRICAN AMERICAN: 3 mL/min — AB (ref >60)
Glucose, Bld: 164 mg/dL — ABNORMAL HIGH (ref 65–99)
POTASSIUM: 5.4 mmol/L — AB (ref 3.5–5.1)
Sodium: 134 mmol/L — ABNORMAL LOW (ref 135–145)
Total Bilirubin: 0.8 mg/dL (ref 0.3–1.2)
Total Protein: 7.5 g/dL (ref 6.5–8.1)

## 2016-07-13 LAB — TROPONIN I
TROPONIN I: 0.13 ng/mL — AB (ref <0.03)
Troponin I: 0.07 ng/mL (ref <0.03)
Troponin I: 0.15 ng/mL (ref <0.03)

## 2016-07-13 LAB — MRSA PCR SCREENING: MRSA by PCR: NEGATIVE

## 2016-07-13 MED ORDER — ORAL CARE MOUTH RINSE
15.0000 mL | Freq: Two times a day (BID) | OROMUCOSAL | Status: DC
Start: 2016-07-13 — End: 2016-07-19
  Administered 2016-07-13 – 2016-07-19 (×10): 15 mL via OROMUCOSAL

## 2016-07-13 MED ORDER — DEXTROSE-NACL 5-0.9 % IV SOLN
INTRAVENOUS | Status: DC
  Administered 2016-07-13: 02:00:00 via INTRAVENOUS

## 2016-07-13 MED ORDER — OXYCODONE HCL 5 MG PO TABS
5.0000 mg | ORAL_TABLET | ORAL | Status: DC | PRN
  Administered 2016-07-14 – 2016-07-16 (×12): 10 mg via ORAL
  Administered 2016-07-16 (×2): 5 mg via ORAL
  Administered 2016-07-17 (×3): 10 mg via ORAL
  Filled 2016-07-13 (×3): qty 2
  Filled 2016-07-13 (×2): qty 1
  Filled 2016-07-13 (×6): qty 2
  Filled 2016-07-13: qty 1
  Filled 2016-07-13 (×3): qty 2
  Filled 2016-07-13: qty 1
  Filled 2016-07-13 (×3): qty 2
  Filled 2016-07-13: qty 1

## 2016-07-13 MED ORDER — OXYCODONE-ACETAMINOPHEN 10-325 MG PO TABS: 1.0000 | ORAL_TABLET | ORAL | Status: DC | PRN

## 2016-07-13 MED ORDER — INSULIN ASPART 100 UNIT/ML ~~LOC~~ SOLN
0.0000 [IU] | Freq: Three times a day (TID) | SUBCUTANEOUS | Status: DC
Start: 2016-07-13 — End: 2016-07-19
  Administered 2016-07-13: 7 [IU] via SUBCUTANEOUS
  Administered 2016-07-13: 1 [IU] via SUBCUTANEOUS
  Administered 2016-07-14 – 2016-07-15 (×4): 2 [IU] via SUBCUTANEOUS
  Administered 2016-07-15: 1 [IU] via SUBCUTANEOUS
  Administered 2016-07-15: 2 [IU] via SUBCUTANEOUS
  Administered 2016-07-16: 3 [IU] via SUBCUTANEOUS
  Administered 2016-07-16: 1 [IU] via SUBCUTANEOUS
  Administered 2016-07-16: 3 [IU] via SUBCUTANEOUS
  Administered 2016-07-17 (×3): 2 [IU] via SUBCUTANEOUS
  Administered 2016-07-18 (×2): 1 [IU] via SUBCUTANEOUS
  Administered 2016-07-18: 2 [IU] via SUBCUTANEOUS

## 2016-07-13 MED ORDER — POLYETHYLENE GLYCOL 3350 17 G PO PACK
17.0000 g | PACK | Freq: Every day | ORAL | Status: DC
  Administered 2016-07-13 – 2016-07-14 (×2): 17 g via ORAL
  Filled 2016-07-13 (×2): qty 1

## 2016-07-13 MED ORDER — DEXTROSE 50 % IV SOLN
25.0000 mL | Freq: Once | INTRAVENOUS | Status: AC
Start: 2016-07-13 — End: 2016-07-13
  Administered 2016-07-13: 25 mL via INTRAVENOUS

## 2016-07-13 MED ORDER — FAMOTIDINE 20 MG PO TABS
20.0000 mg | ORAL_TABLET | Freq: Once | ORAL | Status: AC
  Administered 2016-07-13: 20 mg via ORAL
  Filled 2016-07-13: qty 1

## 2016-07-13 MED ORDER — OXYCODONE-ACETAMINOPHEN 5-325 MG PO TABS
1.0000 | ORAL_TABLET | ORAL | Status: DC | PRN
  Administered 2016-07-13 (×2): 2 via ORAL
  Filled 2016-07-13 (×2): qty 2

## 2016-07-13 MED ORDER — SODIUM CHLORIDE 0.9% FLUSH
10.0000 mL | INTRAVENOUS | Status: DC | PRN
  Administered 2016-07-18 – 2016-07-19 (×3): 10 mL
  Filled 2016-07-13 (×3): qty 40

## 2016-07-13 MED ORDER — DEXTROSE 50 % IV SOLN
25.0000 mL | Freq: Once | INTRAVENOUS | Status: AC
  Administered 2016-07-13: 25 mL via INTRAVENOUS

## 2016-07-13 MED ORDER — HYDROCORTISONE 2.5 % RE CREA
TOPICAL_CREAM | Freq: Three times a day (TID) | RECTAL | Status: DC
  Administered 2016-07-13 – 2016-07-15 (×3): via RECTAL
  Administered 2016-07-15: 1 via RECTAL
  Administered 2016-07-16 – 2016-07-17 (×3): via RECTAL
  Administered 2016-07-17: 1 via RECTAL
  Administered 2016-07-18 – 2016-07-19 (×5): via RECTAL
  Filled 2016-07-13: qty 28.35

## 2016-07-13 MED ORDER — SODIUM CHLORIDE 0.9 % IV SOLN
1.0000 g | Freq: Once | INTRAVENOUS | Status: AC
  Administered 2016-07-13: 1 g via INTRAVENOUS
  Filled 2016-07-13: qty 10

## 2016-07-13 MED ORDER — DEXTROSE 50 % IV SOLN
INTRAVENOUS | Status: AC
  Filled 2016-07-13: qty 50

## 2016-07-13 MED ORDER — SODIUM CHLORIDE 0.9 % IV SOLN
INTRAVENOUS | Status: DC
  Administered 2016-07-13 – 2016-07-19 (×10): via INTRAVENOUS

## 2016-07-13 NOTE — Progress Notes (Signed)
Patient ID: Alexander Wolf, male   DOB: 1955/12/07, 60 y.o.   MRN: [id number]    Referring Physician(s): Dr. Karmen Bongo  Supervising Physician: Daryll Brod  Patient Status: [facility] - In-pt  Chief Complaint: Bilateral severe hydronephrosis  Subjective: Patient is confused today.  He denies any pain in his back.  Allergies: Patient has no known allergies.  Medications: Prior to Admission medications   Medication Sig Start Date End Date Taking? Authorizing Provider  amitriptyline (ELAVIL) 25 MG tablet Take 1 tablet (25 mg total) by mouth at bedtime. 06/13/16  Yes Shawnee Knapp, MD  atorvastatin (LIPITOR) 40 MG tablet Take 1 tablet (40 mg total) by mouth daily. 03/29/16  Yes Wardell Honour, MD  diazepam (VALIUM) 5 MG tablet Take 1 tab by mouth on an empty stomach 2 hrs prior to procedure. Repeat in 60 min if needed 06/28/16  Yes Shawnee Knapp, MD  docusate sodium (COLACE) 100 MG capsule Take 1 capsule (100 mg total) by mouth 2 (two) times daily as needed for mild constipation. Patient taking differently: Take 100 mg by mouth 2 (two) times daily.  04/07/16  Yes Shawnee Knapp, MD  glipiZIDE (GLUCOTROL) 5 MG tablet Take 1 tablet (5 mg total) by mouth 2 (two) times daily before a meal. 05/27/15  Yes Wardell Honour, MD  lidocaine (XYLOCAINE) 5 % ointment Apply 1 application topically as needed. Patient taking differently: Apply 1 application topically as needed for mild pain.  06/29/16  Yes Shawnee Knapp, MD  lidocaine-prilocaine (EMLA) cream Apply to port before chemotherapy. 04/10/16  Yes Wyatt Portela, MD  metFORMIN (GLUCOPHAGE) 1000 MG tablet Take 1 tablet (1,000 mg total) by mouth 2 (two) times daily with a meal. 04/11/16  Yes Tereasa Coop, PA-C  metoprolol (LOPRESSOR) 50 MG tablet TAKE 1/2 TABLET BY MOUTH TWICE DAILY 06/13/16  Yes Shawnee Knapp, MD  Omega-3 Fatty Acids (FISH OIL) 300 MG CAPS Take 600 capsules by mouth 2 (two) times daily.    Yes Historical Provider, MD  omeprazole (PRILOSEC) 40 MG  capsule TAKE 1 CAPSULE BY MOUTH EVERY DAY 03/29/16  Yes Wardell Honour, MD  oxyCODONE-acetaminophen (PERCOCET) 10-325 MG tablet Take 1-2 tablets by mouth every 4 (four) hours as needed for pain. 06/28/16  Yes Shawnee Knapp, MD  polyethylene glycol powder (GLYCOLAX/MIRALAX) powder Take 17 g by mouth 4 (four) times daily as needed. Patient taking differently: Take 17 g by mouth 4 (four) times daily as needed for mild constipation.  04/14/16  Yes Shawnee Knapp, MD  prochlorperazine (COMPAZINE) 10 MG tablet Take 1 tablet (10 mg total) by mouth every 6 (six) hours as needed for nausea or vomiting. 06/14/16  Yes Wyatt Portela, MD  zolpidem (AMBIEN) 10 MG tablet TAKE 1 TABLET BY MOUTH EVERY NIGHT AT BEDTIME AS NEEDED FOR SLEEP 06/05/16  Yes Shawnee Knapp, MD    Vital Signs: BP 130/83   Pulse (!) 112   Temp 98.4 F (36.9 C) (Oral)   Resp (!) 26   Ht 5\' 10"  (1.778 m)   Wt 219 lb 9.3 oz (99.6 kg)   SpO2 93%   BMI 31.51 kg/m   Physical Exam: Abd: (B) PCNs in place with good output.  Both are pink in color but otherwise clear.  2125cc last night on the right and 1700cc last night on the left.  Drain sites are c/d/i  Imaging: Ct Abdomen Pelvis Wo Contrast  Result Date: 07/12/2016 CLINICAL DATA:  60 year old male with acute renal failure, anuria and constipation for 7 days. Patient with bladder cancer, currently on chemotherapy. EXAM: CT ABDOMEN AND PELVIS WITHOUT CONTRAST TECHNIQUE: Multidetector CT imaging of the abdomen and pelvis was performed following the standard protocol without IV contrast. COMPARISON:  03/19/2016 FINDINGS: Please note that parenchymal abnormalities may be missed without intravenous contrast. Lower chest: No acute abnormality. Hepatobiliary: A right hepatic cyst is again noted. No other hepatic or gallbladder abnormalities identified. There is no evidence of biliary dilatation. Pancreas: Unremarkable Spleen: Unremarkable Adrenals/Urinary Tract: New moderate to severe bilateral  hydroureteronephrosis to the bladder is present. This is caused by an enlarging bladder mass, now measuring 3.7 x 8.3 cm with new masses/adenopathy throughout the pelvis. A conglomerate mass posterior to the bladder measures 6.5 x 11.1 cm. The adrenal glands are unremarkable. Stomach/Bowel: No bowel obstruction or focal bowel wall thickening. Vascular/Lymphatic: Aortic atherosclerotic calcifications noted without aneurysm. New lymphadenopathy throughout the pelvis identified. Other: No free fluid or pneumoperitoneum. Musculoskeletal: No acute bony abnormality. IMPRESSION: New moderate to severe bilateral hydronephrosis from significantly enlarged bladder mass/malignancy and metastatic disease/lymphadenopathy within the pelvis. Abdominal aortic atherosclerosis. Electronically Signed   By: Margarette Canada M.D.   On: 07/12/2016 19:21   Ir Nephrostomy Placement Left  Result Date: 07/13/2016 INDICATION: Rapidly progressive bladder cancer, now with bilateral hydronephrosis secondary to obstructive uropathy with the uremia. Please perform ultrasound and fluoroscopic guided bilateral nephrostomy catheter placement. EXAM: 1. ULTRASOUND GUIDANCE FOR PUNCTURE OF THE BILATERAL RENAL COLLECTING SYSTEMS 2. BILATERAL PERCUTANEOUS NEPHROSTOMY TUBE PLACEMENT. COMPARISON:  CT abdomen pelvis - 07/12/2016; 03/19/2016 MEDICATIONS: Ciprofloxacin 400 mg IV; The antibiotic was administered in an appropriate time frame prior to skin puncture. ANESTHESIA/SEDATION: Fentanyl 100 mcg IV; Versed 4 mg IV Total Moderate Sedation Time 26 minutes. CONTRAST:  A total of 20 mL Isovue 300 was administered administered into the bilateral renal collecting systems FLUOROSCOPY TIME:  4 minutes 6 seconds (Q000111Q mGy) COMPLICATIONS: None immediate. PROCEDURE: The procedure, risks, benefits, and alternatives were explained to the patient's wife. Questions regarding the procedure were encouraged and answered. The patient's wife understands and consents to the  procedure. A timeout was performed prior to the initiation of the procedure. The bilateral flanks were prepped with Betadine in a sterile fashion, and a sterile drape was applied covering the operative field. A sterile gown and sterile gloves were used for the procedure. Local anesthesia was provided with 1% Lidocaine with epinephrine. Beginning with the left kidney, under direct ultrasound guidance, a 21 gauge needle was advanced into the renal collecting system. An ultrasound image documentation was performed. Access within the collecting system was confirmed with the efflux of urine followed by contrast injection. Over a Nitrex wire, the track was dilated with an Accustick set. Over a guide wire, a 10-French percutaneous nephrostomy catheter was advanced into the collecting system where the coil was formed and locked. Contrast was injected and several sport radiographs were obtained in various obliquities confirming access. The catheter was secured at the skin with a Prolene retention suture and a gravity bag was placed. Attention was now paid towards the right-sided nephrostomy. Again, a 21 gauge needle was advanced into the renal collecting system. An ultrasound image documentation was performed. Access within the collecting system was confirmed with the efflux of urine followed by contrast injection. Over a Nitrex wire, the track was dilated with an Accustick set. Over a guide wire, a 10-French percutaneous nephrostomy catheter was advanced into the collecting system where the coil was formed and  locked. Contrast was injected and several sport radiographs were obtained in various obliquities confirming access. The catheter was secured at the skin with a Prolene retention suture and a gravity bag was placed. Dressings were placed. The patient tolerated the procedure well without immediate postprocedural complication. FINDINGS: Ultrasound scanning demonstrates a mild to slightly moderately dilated collecting  system. Under direct ultrasound guidance, a posterior inferior calix was targeted bilaterally allowing advancement of 10-French percutaneous nephrostomy catheters bilaterally under intermittent fluoroscopic guidance. Contrast injection confirmed appropriate positioning. IMPRESSION: Successful ultrasound and fluoroscopic guided placement of bilateral 10 French PCNs. Electronically Signed   By: Sandi Mariscal M.D.   On: 07/13/2016 08:08   Ir Nephrostomy Placement Right  Result Date: 07/13/2016 INDICATION: Rapidly progressive bladder cancer, now with bilateral hydronephrosis secondary to obstructive uropathy with the uremia. Please perform ultrasound and fluoroscopic guided bilateral nephrostomy catheter placement. EXAM: 1. ULTRASOUND GUIDANCE FOR PUNCTURE OF THE BILATERAL RENAL COLLECTING SYSTEMS 2. BILATERAL PERCUTANEOUS NEPHROSTOMY TUBE PLACEMENT. COMPARISON:  CT abdomen pelvis - 07/12/2016; 03/19/2016 MEDICATIONS: Ciprofloxacin 400 mg IV; The antibiotic was administered in an appropriate time frame prior to skin puncture. ANESTHESIA/SEDATION: Fentanyl 100 mcg IV; Versed 4 mg IV Total Moderate Sedation Time 26 minutes. CONTRAST:  A total of 20 mL Isovue 300 was administered administered into the bilateral renal collecting systems FLUOROSCOPY TIME:  4 minutes 6 seconds (Q000111Q mGy) COMPLICATIONS: None immediate. PROCEDURE: The procedure, risks, benefits, and alternatives were explained to the patient's wife. Questions regarding the procedure were encouraged and answered. The patient's wife understands and consents to the procedure. A timeout was performed prior to the initiation of the procedure. The bilateral flanks were prepped with Betadine in a sterile fashion, and a sterile drape was applied covering the operative field. A sterile gown and sterile gloves were used for the procedure. Local anesthesia was provided with 1% Lidocaine with epinephrine. Beginning with the left kidney, under direct ultrasound guidance,  a 21 gauge needle was advanced into the renal collecting system. An ultrasound image documentation was performed. Access within the collecting system was confirmed with the efflux of urine followed by contrast injection. Over a Nitrex wire, the track was dilated with an Accustick set. Over a guide wire, a 10-French percutaneous nephrostomy catheter was advanced into the collecting system where the coil was formed and locked. Contrast was injected and several sport radiographs were obtained in various obliquities confirming access. The catheter was secured at the skin with a Prolene retention suture and a gravity bag was placed. Attention was now paid towards the right-sided nephrostomy. Again, a 21 gauge needle was advanced into the renal collecting system. An ultrasound image documentation was performed. Access within the collecting system was confirmed with the efflux of urine followed by contrast injection. Over a Nitrex wire, the track was dilated with an Accustick set. Over a guide wire, a 10-French percutaneous nephrostomy catheter was advanced into the collecting system where the coil was formed and locked. Contrast was injected and several sport radiographs were obtained in various obliquities confirming access. The catheter was secured at the skin with a Prolene retention suture and a gravity bag was placed. Dressings were placed. The patient tolerated the procedure well without immediate postprocedural complication. FINDINGS: Ultrasound scanning demonstrates a mild to slightly moderately dilated collecting system. Under direct ultrasound guidance, a posterior inferior calix was targeted bilaterally allowing advancement of 10-French percutaneous nephrostomy catheters bilaterally under intermittent fluoroscopic guidance. Contrast injection confirmed appropriate positioning. IMPRESSION: Successful ultrasound and fluoroscopic guided placement of bilateral 10  [place] PCNs. Electronically Signed   By: Sandi Mariscal  M.D.   On: 07/13/2016 08:08    Labs:  CBC:  Recent Labs  06/12/16 1217 07/12/16 1132 07/12/16 1757 07/12/16 1806 07/13/16 0327  WBC 11.2* 13.1* 9.5  --  9.8  HGB 11.6* 9.9* 8.8* 8.8* 8.0*  HCT 35.7* 29.3* 26.8* 26.0* 24.5*  PLT 337 344 291  --  265    COAGS:  Recent Labs  04/19/16 1245 07/12/16 1757  INR 0.98 1.19    BMP:  Recent Labs  04/19/16 1245  07/12/16 1132 07/12/16 1757 07/12/16 1806 07/13/16 0027 07/13/16 0327  NA 137  < > 136 134* 131* 136 136  K 4.5  < > 6.2* 5.4* 5.3* 4.4 4.7  CL 101  --   --  97* 98* 98* 99*  CO2 26  < > 11* 16*  --  16* 19*  GLUCOSE 111*  < > 58* 164* 153* 60* 132*  BUN 11  < > 98.6* 103* 102* 99* 98*  CALCIUM 9.4  < > 10.0 8.4*  --  8.3* 8.2*  CREATININE 0.73  < > 16.4* 16.78* >18.00* 15.42* 14.63*  GFRNONAA >60  --   --  3*  --  3* 3*  GFRAA >60  --   --  3*  --  3* 4*  < > = values in this interval not displayed.  LIVER FUNCTION TESTS:  Recent Labs  06/05/16 0800 06/12/16 1218 07/12/16 1132 07/12/16 1757  BILITOT 0.23 0.36 0.51 0.8  AST 24 20 24 24   ALT 21 17 <6 7*  ALKPHOS 138 148 158* 111  PROT 7.7 8.3 8.2 7.5  ALBUMIN 3.0* 3.3* 3.0* 3.3*    Assessment and Plan: 1. (B) severe hydroureteronephrosis secondary to bladder tumor, s/p (B) PCNs by Pascal Lux 11/30 -routine drain care. -good urine output from both drains -minimal pink tinge expected from new placement. -will follow, further plans per primary service.  Electronically Signed: Henreitta Cea 07/13/2016, 1:02 PM   I spent a total of 15 Minutes at the the patient's bedside AND on the patient's hospital floor or unit, greater than 50% of which was counseling/coordinating care for bilateral hydroureteronephrosis

## 2016-07-13 NOTE — Progress Notes (Signed)
CRITICAL VALUE ALERT  Critical value received:  Troponin 0.15  Date of notification:  07/13/16  Time of notification:  [id number]  Critical value read back:Yes.    Nurse who received alert:  Katherine Mantle RN  MD notified (1st page):  [place]  Time of first page:  [phone]  MD notified (2nd page):  Time of second page:  Responding MD:  Dyann Kief  Time MD responded:  [phone]

## 2016-07-13 NOTE — Progress Notes (Signed)
IP PROGRESS NOTE  Subjective:   Events in the last 24 hours noted. Patient known to me with localized bladder tumor and received 3 cycles of neoadjuvant chemotherapy completed on 06/12/2016. He presented with acute renal failure on 07/12/2016. CT scan obtained overnight which showed enlarging bladder mass and bilateral pelvic adenopathy. He had percutaneous nephrostomy tube placed successfully.  Clinically, he reports feeling much better at this time. He reported last week or so decreased urine output, failure to thrive and constipation. He reports some flank pain which has improved overnight. He denied any hematuria, fevers or chills.  Objective:  Vital signs in last 24 hours: Temp:  [97.6 F (36.4 C)] 97.6 F (36.4 C) (11/30 1715) Pulse Rate:  [103-143] 118 (12/01 0700) Resp:  [10-25] 21 (12/01 0700) BP: (94-156)/(53-108) 121/54 (12/01 0700) SpO2:  [90 %-100 %] 93 % (12/01 0700) Weight:  [206 lb 9.1 oz (93.7 kg)] 206 lb 9.1 oz (93.7 kg) (12/01 0128) Weight change:  Last BM Date:  (PTA)  Intake/Output from previous day: 11/30 0701 - 12/01 0700 In: 610 [IV Piggyback:610] Out: 3825 [Urine:3825] Alert, awake gentleman appeared without distress. Mouth: mucous membranes moist, pharynx normal without lesions Resp: clear to auscultation bilaterally Cardio: regular rate and rhythm, S1, S2 normal, no murmur, click, rub or gallop  Chest wall examination showed a Port-A-Cath in place without any erythema or induration. GI: soft, non-tender; bowel sounds normal; no masses,  no organomegaly Extremities: extremities normal, atraumatic, no cyanosis or edema    Lab Results:  Recent Labs  07/12/16 1757 07/12/16 1806 07/13/16 0327  WBC 9.5  --  9.8  HGB 8.8* 8.8* 8.0*  HCT 26.8* 26.0* 24.5*  PLT 291  --  265    BMET  Recent Labs  07/13/16 0027 07/13/16 0327  NA 136 136  K 4.4 4.7  CL 98* 99*  CO2 16* 19*  GLUCOSE 60* 132*  BUN 99* 98*  CREATININE 15.42* 14.63*  CALCIUM  8.3* 8.2*    Studies/Results: Ct Abdomen Pelvis Wo Contrast  Result Date: 07/12/2016 CLINICAL DATA:  60 year old male with acute renal failure, anuria and constipation for 7 days. Patient with bladder cancer, currently on chemotherapy. EXAM: CT ABDOMEN AND PELVIS WITHOUT CONTRAST TECHNIQUE: Multidetector CT imaging of the abdomen and pelvis was performed following the standard protocol without IV contrast. COMPARISON:  03/19/2016 FINDINGS: Please note that parenchymal abnormalities may be missed without intravenous contrast. Lower chest: No acute abnormality. Hepatobiliary: A right hepatic cyst is again noted. No other hepatic or gallbladder abnormalities identified. There is no evidence of biliary dilatation. Pancreas: Unremarkable Spleen: Unremarkable Adrenals/Urinary Tract: New moderate to severe bilateral hydroureteronephrosis to the bladder is present. This is caused by an enlarging bladder mass, now measuring 3.7 x 8.3 cm with new masses/adenopathy throughout the pelvis. A conglomerate mass posterior to the bladder measures 6.5 x 11.1 cm. The adrenal glands are unremarkable. Stomach/Bowel: No bowel obstruction or focal bowel wall thickening. Vascular/Lymphatic: Aortic atherosclerotic calcifications noted without aneurysm. New lymphadenopathy throughout the pelvis identified. Other: No free fluid or pneumoperitoneum. Musculoskeletal: No acute bony abnormality. IMPRESSION: New moderate to severe bilateral hydronephrosis from significantly enlarged bladder mass/malignancy and metastatic disease/lymphadenopathy within the pelvis. Abdominal aortic atherosclerosis. Electronically Signed   By: Margarette Canada M.D.   On: 07/12/2016 19:21    Medications: I have reviewed the patient's current medications.  Assessment/Plan:  60 year old gentleman with the following issues:  1. 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 He presented with a large tumor  invading into the prostate. He received  3 cycles of neoadjuvant chemotherapy completed on 06/12/2016.  CT scan obtained on 07/12/2016 showed rapidly enlarging tumor with a bladder mass measuring 3.7 x 8.3 cm with no masses in adenopathy throughout the pelvis. There is also a mass posterior to the bladder measuring 6.5 x 11.1 cm.  This presentation is rather unusual as his tumor regrew rather rapidly while on systemic chemotherapy. I believe management options at this time I rather limited. I doubt surgical intervention would be an option given the large tumors and extensive adenopathy noted. Radiation therapy might also be less effective given the bulk of tumor. The fact that this tumor grew while he is on systemic chemotherapy makes additional chemotherapy likely less effective.  We will discuss this case in the multidisciplinary genitourinary tumor board unlikely will receive salvage chemotherapy if no other options exist once his acute episode resolves.  2. Acute renal failure: This is secondary to bilateral hydronephrosis. I anticipate improvement in his kidney function rather rapidly.  3. Hydronephrosis: Related to bladder tumor and he is status post bilateral percutaneous nephrostomy tube. I will await the input from urology regarding future stent placement at some point.  4. Anemia: Related to acute renal failure as well as malignancy. He is asymptomatic at this time.  We'll arrange oncology follow up for him upon his discharge once he is medically stable.    LOS: 1 day   [facility] 07/13/2016, 7:58 AM

## 2016-07-13 NOTE — Progress Notes (Addendum)
[facility] PROGRESS NOTE  Alexander Wolf [id number] DOB: Jul 11, 1956 DOA: 07/12/2016 PCP: Alexander Cheadle, MD  Interim summary and HPI 60 y.o. male with medical history significant of bladder cancer, DM, CAD s/p CABG, HTN, and HLD presenting with abnormal labs.   Patient with routine labs today, K+ and creatinine elevated.  Vomiting, inability to urinate.  Inability to urinate since Monday definitely, but wife is unsure if maybe longer.  Intermittent vomiting for the last week.  +confusion, waxing and waning for over a month now. Anorectal pain and anxiety reported as well.  Assessment/Plan: 1-acute renal failure: due to hydronephrosis and tumor burden obstructing outlet -patient status post PCNT by IR -draining properly and with great output -Cr rapidly trending down -will continue IVF's -no further intervention plan currently; will advance diet -resume home meds -provide supportive care and IVF's/antiemetics and analgesics  -will follow urology rec's  2-Metabolic encephalopathy -due to uremia -improved -will monitor mentation   3-hyperkalemia and hypocalcemia -treated in ED and received calcium gluconate -K now 4.6 -associated with acute renal failure -will monitor and replete as needed  -continue telemetry monitoring   4-Malignant bladder neoplasm -will follow oncology and urology rec's -prognosis appears gray right now -patient has essentially failed chemotherapy -per Dr. Alen Blew very little to be offer on radiation or even salvage chemo -urology to see for potential surgical palliation (if that is even an option)  5-anemia of chronic disease -will monitor for now -no transfusion needed currently  6-type 2 diabetes -will hold oral hypoglycemic agents and start patient on SSI  7-chronic pain syndrome -associated with cancer -will resume home oxycodone -will also add anusol cream   8-lactic acidosis -due to ARF and dehydration -will continue IVF's -will follow  lactic acid   Code Status: Full Family Communication: wife at bedside  Disposition Plan: will monitor for couple more hours in stepdown; will advance diet and start PO analgesics. Will follow urology and oncology rec's. Non toxic and with good output (through PCNT). If remains stable will transfer to telemetry bed.   Consultants:  Urology  Oncology  IR  Procedures:  PCNT placement on 11/30  Antibiotics:  Received Ciprofloxacin prophylactically for PCNT placement  11/30  HPI/Subjective: Afebrile, no CP, no SOB. Reports pain in anorectal area. Tachycardic (sinus rhythm); hungry and will like something to eat.  Objective: Vitals:   07/13/16 0800 07/13/16 0900  BP: 139/78 (!) 98/59  Pulse: (!) 118 (!) 118  Resp: (!) 24 (!) 26  Temp: 98.5 F (36.9 C)     Intake/Output Summary (Last 24 hours) at 07/13/16 0937 Last data filed at 07/13/16 0846  Gross per 24 hour  Intake              610 ml  Output             5025 ml  Net            -4415 ml   Filed Weights   07/13/16 0128  Weight: 93.7 kg (206 lb 9.1 oz)    Exam:   General:  Afebrile, hungry, oriented X3 currently. Reports pain in his rectal area, no nausea or vomiting at this moment.  Cardiovascular: S1 and S2, tachycardic, no rubs or gallops  Respiratory: good air movement, no wheezing, no crackles, patient with good O2 sat on RA and not using accessory muscles   Abdomen: soft, NT, ND, positive BS  Musculoskeletal: no edema or cyanosis   Data Reviewed: Basic Metabolic Panel:  Recent Labs Lab 07/12/16  1132 07/12/16 1757 07/12/16 1806 07/13/16 0027 07/13/16 0327  NA 136 134* 131* 136 136  K 6.2* 5.4* 5.3* 4.4 4.7  CL  --  97* 98* 98* 99*  CO2 11* 16*  --  16* 19*  GLUCOSE 58* 164* 153* 60* 132*  BUN 98.6* 103* 102* 99* 98*  CREATININE 16.4* 16.78* >18.00* 15.42* 14.63*  CALCIUM 10.0 8.4*  --  8.3* 8.2*   Liver Function Tests:  Recent Labs Lab 07/12/16 1132 07/12/16 1757  AST 24 24  ALT  <6 7*  ALKPHOS 158* 111  BILITOT 0.51 0.8  PROT 8.2 7.5  ALBUMIN 3.0* 3.3*   CBC:  Recent Labs Lab 07/12/16 1132 07/12/16 1757 07/12/16 1806 07/13/16 0327  WBC 13.1* 9.5  --  9.8  NEUTROABS 10.3* 7.7  --   --   HGB 9.9* 8.8* 8.8* 8.0*  HCT 29.3* 26.8* 26.0* 24.5*  MCV 84.9 85.4  --  83.6  PLT 344 291  --  265   Cardiac Enzymes:  Recent Labs Lab 07/13/16 0327  TROPONINI 0.07*   CBG:  Recent Labs Lab 07/13/16 0049 07/13/16 0112 07/13/16 0331 07/13/16 0353 07/13/16 0746  GLUCAP 44* 70 46* 98 87    Recent Results (from the past 240 hour(s))  MRSA PCR Screening     Status: None   Collection Time: 07/12/16 11:59 PM  Result Value Ref Range Status   MRSA by PCR NEGATIVE NEGATIVE Final    Comment:        The GeneXpert MRSA Assay (FDA approved for NASAL specimens only), is one component of a comprehensive MRSA colonization surveillance program. It is not intended to diagnose MRSA infection nor to guide or monitor treatment for MRSA infections.      Studies: Ct Abdomen Pelvis Wo Contrast  Result Date: 07/12/2016 CLINICAL DATA:  60 year old male with acute renal failure, anuria and constipation for 7 days. Patient with bladder cancer, currently on chemotherapy. EXAM: CT ABDOMEN AND PELVIS WITHOUT CONTRAST TECHNIQUE: Multidetector CT imaging of the abdomen and pelvis was performed following the standard protocol without IV contrast. COMPARISON:  03/19/2016 FINDINGS: Please note that parenchymal abnormalities may be missed without intravenous contrast. Lower chest: No acute abnormality. Hepatobiliary: A right hepatic cyst is again noted. No other hepatic or gallbladder abnormalities identified. There is no evidence of biliary dilatation. Pancreas: Unremarkable Spleen: Unremarkable Adrenals/Urinary Tract: New moderate to severe bilateral hydroureteronephrosis to the bladder is present. This is caused by an enlarging bladder mass, now measuring 3.7 x 8.3 cm with new  masses/adenopathy throughout the pelvis. A conglomerate mass posterior to the bladder measures 6.5 x 11.1 cm. The adrenal glands are unremarkable. Stomach/Bowel: No bowel obstruction or focal bowel wall thickening. Vascular/Lymphatic: Aortic atherosclerotic calcifications noted without aneurysm. New lymphadenopathy throughout the pelvis identified. Other: No free fluid or pneumoperitoneum. Musculoskeletal: No acute bony abnormality. IMPRESSION: New moderate to severe bilateral hydronephrosis from significantly enlarged bladder mass/malignancy and metastatic disease/lymphadenopathy within the pelvis. Abdominal aortic atherosclerosis. Electronically Signed   By: Margarette Canada M.D.   On: 07/12/2016 19:21   Ir Nephrostomy Placement Left  Result Date: 07/13/2016 INDICATION: Rapidly progressive bladder cancer, now with bilateral hydronephrosis secondary to obstructive uropathy with the uremia. Please perform ultrasound and fluoroscopic guided bilateral nephrostomy catheter placement. EXAM: 1. ULTRASOUND GUIDANCE FOR PUNCTURE OF THE BILATERAL RENAL COLLECTING SYSTEMS 2. BILATERAL PERCUTANEOUS NEPHROSTOMY TUBE PLACEMENT. COMPARISON:  CT abdomen pelvis - 07/12/2016; 03/19/2016 MEDICATIONS: Ciprofloxacin 400 mg IV; The antibiotic was administered in an appropriate time frame prior  to skin puncture. ANESTHESIA/SEDATION: Fentanyl 100 mcg IV; Versed 4 mg IV Total Moderate Sedation Time 26 minutes. CONTRAST:  A total of 20 mL Isovue 300 was administered administered into the bilateral renal collecting systems FLUOROSCOPY TIME:  4 minutes 6 seconds (Q000111Q mGy) COMPLICATIONS: None immediate. PROCEDURE: The procedure, risks, benefits, and alternatives were explained to the patient's wife. Questions regarding the procedure were encouraged and answered. The patient's wife understands and consents to the procedure. A timeout was performed prior to the initiation of the procedure. The bilateral flanks were prepped with Betadine in a  sterile fashion, and a sterile drape was applied covering the operative field. A sterile gown and sterile gloves were used for the procedure. Local anesthesia was provided with 1% Lidocaine with epinephrine. Beginning with the left kidney, under direct ultrasound guidance, a 21 gauge needle was advanced into the renal collecting system. An ultrasound image documentation was performed. Access within the collecting system was confirmed with the efflux of urine followed by contrast injection. Over a Nitrex wire, the track was dilated with an Accustick set. Over a guide wire, a 10-French percutaneous nephrostomy catheter was advanced into the collecting system where the coil was formed and locked. Contrast was injected and several sport radiographs were obtained in various obliquities confirming access. The catheter was secured at the skin with a Prolene retention suture and a gravity bag was placed. Attention was now paid towards the right-sided nephrostomy. Again, a 21 gauge needle was advanced into the renal collecting system. An ultrasound image documentation was performed. Access within the collecting system was confirmed with the efflux of urine followed by contrast injection. Over a Nitrex wire, the track was dilated with an Accustick set. Over a guide wire, a 10-French percutaneous nephrostomy catheter was advanced into the collecting system where the coil was formed and locked. Contrast was injected and several sport radiographs were obtained in various obliquities confirming access. The catheter was secured at the skin with a Prolene retention suture and a gravity bag was placed. Dressings were placed. The patient tolerated the procedure well without immediate postprocedural complication. FINDINGS: Ultrasound scanning demonstrates a mild to slightly moderately dilated collecting system. Under direct ultrasound guidance, a posterior inferior calix was targeted bilaterally allowing advancement of 10-French  percutaneous nephrostomy catheters bilaterally under intermittent fluoroscopic guidance. Contrast injection confirmed appropriate positioning. IMPRESSION: Successful ultrasound and fluoroscopic guided placement of bilateral 10 French PCNs. Electronically Signed   By: Sandi Mariscal M.D.   On: 07/13/2016 08:08   Ir Nephrostomy Placement Right  Result Date: 07/13/2016 INDICATION: Rapidly progressive bladder cancer, now with bilateral hydronephrosis secondary to obstructive uropathy with the uremia. Please perform ultrasound and fluoroscopic guided bilateral nephrostomy catheter placement. EXAM: 1. ULTRASOUND GUIDANCE FOR PUNCTURE OF THE BILATERAL RENAL COLLECTING SYSTEMS 2. BILATERAL PERCUTANEOUS NEPHROSTOMY TUBE PLACEMENT. COMPARISON:  CT abdomen pelvis - 07/12/2016; 03/19/2016 MEDICATIONS: Ciprofloxacin 400 mg IV; The antibiotic was administered in an appropriate time frame prior to skin puncture. ANESTHESIA/SEDATION: Fentanyl 100 mcg IV; Versed 4 mg IV Total Moderate Sedation Time 26 minutes. CONTRAST:  A total of 20 mL Isovue 300 was administered administered into the bilateral renal collecting systems FLUOROSCOPY TIME:  4 minutes 6 seconds (Q000111Q mGy) COMPLICATIONS: None immediate. PROCEDURE: The procedure, risks, benefits, and alternatives were explained to the patient's wife. Questions regarding the procedure were encouraged and answered. The patient's wife understands and consents to the procedure. A timeout was performed prior to the initiation of the procedure. The bilateral flanks were prepped with Betadine  in a sterile fashion, and a sterile drape was applied covering the operative field. A sterile gown and sterile gloves were used for the procedure. Local anesthesia was provided with 1% Lidocaine with epinephrine. Beginning with the left kidney, under direct ultrasound guidance, a 21 gauge needle was advanced into the renal collecting system. An ultrasound image documentation was performed. Access within  the collecting system was confirmed with the efflux of urine followed by contrast injection. Over a Nitrex wire, the track was dilated with an Accustick set. Over a guide wire, a 10-French percutaneous nephrostomy catheter was advanced into the collecting system where the coil was formed and locked. Contrast was injected and several sport radiographs were obtained in various obliquities confirming access. The catheter was secured at the skin with a Prolene retention suture and a gravity bag was placed. Attention was now paid towards the right-sided nephrostomy. Again, a 21 gauge needle was advanced into the renal collecting system. An ultrasound image documentation was performed. Access within the collecting system was confirmed with the efflux of urine followed by contrast injection. Over a Nitrex wire, the track was dilated with an Accustick set. Over a guide wire, a 10-French percutaneous nephrostomy catheter was advanced into the collecting system where the coil was formed and locked. Contrast was injected and several sport radiographs were obtained in various obliquities confirming access. The catheter was secured at the skin with a Prolene retention suture and a gravity bag was placed. Dressings were placed. The patient tolerated the procedure well without immediate postprocedural complication. FINDINGS: Ultrasound scanning demonstrates a mild to slightly moderately dilated collecting system. Under direct ultrasound guidance, a posterior inferior calix was targeted bilaterally allowing advancement of 10-French percutaneous nephrostomy catheters bilaterally under intermittent fluoroscopic guidance. Contrast injection confirmed appropriate positioning. IMPRESSION: Successful ultrasound and fluoroscopic guided placement of bilateral 10 French PCNs. Electronically Signed   By: Sandi Mariscal M.D.   On: 07/13/2016 08:08    Scheduled Meds: . amitriptyline  25 mg Oral QHS  . atorvastatin  40 mg Oral Daily  .  docusate sodium  100 mg Oral BID  . enoxaparin (LOVENOX) injection  30 mg Subcutaneous Q24H  . hydrocortisone   Rectal TID  . insulin aspart  0-9 Units Subcutaneous TID WC  . lidocaine-EPINEPHrine      . mouth rinse  15 mL Mouth Rinse BID  . metoprolol  25 mg Oral BID  . pantoprazole  80 mg Oral Daily  . polyethylene glycol  17 g Oral Daily  . sodium chloride flush  3 mL Intravenous Q12H   Continuous Infusions: . sodium chloride 125 mL/hr at 07/13/16 0800    Principal Problem:   Acute renal failure (ARF) (HCC) Active Problems:   Malignant neoplasm of urinary bladder (HCC)   Nonspecific abnormal electrocardiogram (ECG) (EKG)   Hyperkalemia   Elevated lactic acid level   Uremia   Acute bilateral obstructive uropathy   Anemia of chronic disease   Hypocalcemia    Time spent: 35 minutes    Barton Dubois  [facility] Pager [phone]. If 7PM-7AM, please contact night-coverage at [email or web address], password [facility] 07/13/2016, 9:37 AM  LOS: 1 day

## 2016-07-13 NOTE — Progress Notes (Signed)
CRITICAL VALUE ALERT  Critical value received:  Troponin 0.13  Date of notification:  07/13/16  Time of notification:  [id number]  Critical value read back:Yes.    Nurse who received alert:  Katherine Mantle  MD notified (1st page):  Dr Dyann Kief  Time of first page:  66  MD notified (2nd page):  Time of second page:  Responding MD:  Dyann Kief  Time MD responded:  [id number]

## 2016-07-14 ENCOUNTER — Telehealth: Payer: Self-pay

## 2016-07-14 LAB — BASIC METABOLIC PANEL
Anion gap: 12 (ref 5–15)
BUN: 51 mg/dL — AB (ref 6–20)
CHLORIDE: 111 mmol/L (ref 101–111)
CO2: 21 mmol/L — AB (ref 22–32)
CREATININE: 6.2 mg/dL — AB (ref 0.61–1.24)
Calcium: 8.8 mg/dL — ABNORMAL LOW (ref 8.9–10.3)
GFR calc Af Amer: 10 mL/min — ABNORMAL LOW (ref >60)
GFR calc non Af Amer: 9 mL/min — ABNORMAL LOW (ref >60)
Glucose, Bld: 157 mg/dL — ABNORMAL HIGH (ref 65–99)
Potassium: 4.5 mmol/L (ref 3.5–5.1)
SODIUM: 144 mmol/L (ref 135–145)

## 2016-07-14 LAB — GLUCOSE, CAPILLARY
GLUCOSE-CAPILLARY: 170 mg/dL — AB (ref 65–99)
GLUCOSE-CAPILLARY: 120 mg/dL — AB (ref 65–99)
Glucose-Capillary: 152 mg/dL — ABNORMAL HIGH (ref 65–99)
Glucose-Capillary: 195 mg/dL — ABNORMAL HIGH (ref 65–99)

## 2016-07-14 LAB — LACTIC ACID, PLASMA: Lactic Acid, Venous: 1.3 mmol/L (ref 0.5–1.9)

## 2016-07-14 MED ORDER — HYDROMORPHONE HCL 1 MG/ML IJ SOLN
1.0000 mg | INTRAMUSCULAR | Status: DC | PRN
  Administered 2016-07-16 – 2016-07-18 (×8): 1 mg via INTRAVENOUS
  Filled 2016-07-14 (×9): qty 1

## 2016-07-14 MED ORDER — HYDRALAZINE HCL 20 MG/ML IJ SOLN: 10.0000 mg | Freq: Four times a day (QID) | INTRAMUSCULAR | Status: DC | PRN

## 2016-07-14 MED ORDER — QUETIAPINE FUMARATE 25 MG PO TABS
25.0000 mg | ORAL_TABLET | Freq: Two times a day (BID) | ORAL | Status: DC
  Administered 2016-07-14 – 2016-07-17 (×7): 25 mg via ORAL
  Filled 2016-07-14 (×8): qty 1

## 2016-07-14 MED ORDER — HALOPERIDOL LACTATE 5 MG/ML IJ SOLN
1.0000 mg | Freq: Four times a day (QID) | INTRAMUSCULAR | Status: DC | PRN
  Administered 2016-07-14 (×2): 1 mg via INTRAVENOUS
  Filled 2016-07-14 (×2): qty 1

## 2016-07-14 MED ORDER — METOPROLOL TARTRATE 50 MG PO TABS
50.0000 mg | ORAL_TABLET | Freq: Two times a day (BID) | ORAL | Status: DC
  Administered 2016-07-14 – 2016-07-19 (×11): 50 mg via ORAL
  Filled 2016-07-14: qty 2
  Filled 2016-07-14 (×2): qty 1
  Filled 2016-07-14 (×3): qty 2
  Filled 2016-07-14: qty 1
  Filled 2016-07-14: qty 2
  Filled 2016-07-14: qty 1
  Filled 2016-07-14: qty 2
  Filled 2016-07-14 (×2): qty 1

## 2016-07-14 MED ORDER — HALOPERIDOL LACTATE 5 MG/ML IJ SOLN
1.0000 mg | Freq: Three times a day (TID) | INTRAMUSCULAR | Status: DC | PRN
  Administered 2016-07-14: 1 mg via INTRAVENOUS
  Filled 2016-07-14: qty 1

## 2016-07-14 NOTE — Progress Notes (Signed)
Patient ID: Alexander Wolf, male   DOB: 05-May-1956, 60 y.o.   MRN: [id number] Mr. Alexander Wolf has rapidly progressive metastatic bladder cancer despite chemotherapy and was admitted with bilateral hydronephrosis with ARI.   He is s/p bilateral percs with improvement in the Cr but he is still critically ill.   I have reviewed the CT scans and he is not a candidate for antegrade stents.   He should continue management with the perc tubes.   I will notify Dr. Tresa Moore of his admission.  I have not done a formal consult and will notify Dr. Tresa Moore of the admission.

## 2016-07-14 NOTE — Telephone Encounter (Signed)
Patient's wife called to cancel patient's appointment. She wanted me to give an update for Brigitte Pulse as well.  She states that her husband has been hospitalized and she wanted Brigitte Pulse to know that.  [phone]

## 2016-07-14 NOTE — Telephone Encounter (Signed)
Thanks for letter me know

## 2016-07-14 NOTE — Progress Notes (Signed)
[facility] PROGRESS NOTE  Alexander Wolf [id number] DOB: August 01, 1956 DOA: 07/12/2016 PCP: [name], MD  Interim summary and HPI 60 y.o. male with medical history significant of bladder cancer, DM, CAD s/p CABG, HTN, and HLD presenting with abnormal labs.   Patient with routine labs today, K+ and creatinine elevated.  Vomiting, inability to urinate.  Inability to urinate since Monday definitely, but wife is unsure if maybe longer.  Intermittent vomiting for the last week.  +confusion, waxing and waning for over a month now. Anorectal pain and anxiety reported as well.  Assessment/Plan: 1-acute renal failure: due to hydronephrosis and tumor burden obstructing outlet -patient status post PCNT by IR on 11/30 -draining properly and with great output -Cr rapidly trending down (down to 6.20 now) -will continue IVF's and supportive care -no further interventions planned at this moment  -provide antiemetics and analgesics  -will follow urology rec's  2-Metabolic encephalopathy/confusion  -due to uremia most likely; also with concerns for hospital acquired delirium and medications -will stop benzo's, minimize IV narcotics and start seroquel  -PRN haldol and monitoring sitter ordered as well -will monitor mentation closely  3-hyperkalemia and hypocalcemia -treated in ED and received calcium gluconate -K now 4.5 -associated with acute renal failure -will monitor and replete as needed  -continue telemetry monitoring   4-Malignant bladder neoplasm -will follow oncology and urology rec's -prognosis appears gray right now -patient has essentially failed chemotherapy -per Dr. Alen Blew very little to be offer on radiation or even salvage chemo -urology to see for potential surgical palliation (if that is even an option)  5-anemia of chronic disease -will monitor for now -no transfusion needed currently  6-type 2 diabetes -will hold oral hypoglycemic agents and start patient on  SSI  7-chronic pain syndrome -associated with cancer -will continue home oxycodone -will also continue anusol cream   8-lactic acidosis -due to ARF and dehydration -will continue IVF's -lactic acid WNL now   Code Status: Full Family Communication: wife at bedside  Disposition Plan: will monitor for couple more hours to another day in stepdown (secondary to current mental status and needs); will follow electrolytes and renal function. Will follow urology and oncology rec's. Non toxic and with good output (through PCNT).   Consultants:  Urology  Oncology  IR  Procedures:  PCNT placement on 11/30  Antibiotics:  Received Ciprofloxacin prophylactically for PCNT placement  11/30  HPI/Subjective: Afebrile, no CP, no SOB. Patient is very confuse and restless. Tachycardic (sinus rhythm).  Objective: Vitals:   07/14/16 0700 07/14/16 0800  BP: (!) 160/72 (!) 163/100  Pulse: (!) 124 (!) 128  Resp: (!) 21 (!) 27  Temp:  97.5 F (36.4 C)    Intake/Output Summary (Last 24 hours) at 07/14/16 0928 Last data filed at 07/14/16 0900  Gross per 24 hour  Intake             3160 ml  Output             6600 ml  Net            -3440 ml   Filed Weights   07/13/16 0128 07/13/16 0701  Weight: 93.7 kg (206 lb 9.1 oz) 99.6 kg (219 lb 9.3 oz)    Exam:   General:  Afebrile, very confused and only oriented X 1; no CP and no SOB. Restless and having difficulty following commands.   Cardiovascular: S1 and S2, sinus tachycardic, no rubs or gallops  Respiratory: good air movement, no wheezing, no crackles,  patient with good O2 sat on RA and not using accessory muscles   Abdomen: soft, NT, ND, positive BS  Musculoskeletal: no edema or cyanosis   Data Reviewed: Basic Metabolic Panel:  Recent Labs Lab 07/12/16 1132 07/12/16 1757 07/12/16 1806 07/13/16 0027 07/13/16 0327 07/14/16 0518  NA 136 134* 131* 136 136 144  K 6.2* 5.4* 5.3* 4.4 4.7 4.5  CL  --  97* 98* 98* 99* 111   CO2 11* 16*  --  16* 19* 21*  GLUCOSE 58* 164* 153* 60* 132* 157*  BUN 98.6* 103* 102* 99* 98* 51*  CREATININE 16.4* 16.78* >18.00* 15.42* 14.63* 6.20*  CALCIUM 10.0 8.4*  --  8.3* 8.2* 8.8*   Liver Function Tests:  Recent Labs Lab 07/12/16 1132 07/12/16 1757  AST 24 24  ALT <6 7*  ALKPHOS 158* 111  BILITOT 0.51 0.8  PROT 8.2 7.5  ALBUMIN 3.0* 3.3*   CBC:  Recent Labs Lab 07/12/16 1132 07/12/16 1757 07/12/16 1806 07/13/16 0327  WBC 13.1* 9.5  --  9.8  NEUTROABS 10.3* 7.7  --   --   HGB 9.9* 8.8* 8.8* 8.0*  HCT 29.3* 26.8* 26.0* 24.5*  MCV 84.9 85.4  --  83.6  PLT 344 291  --  265   Cardiac Enzymes:  Recent Labs Lab 07/13/16 0327 07/13/16 0913 07/13/16 1528  TROPONINI 0.07* 0.13* 0.15*   CBG:  Recent Labs Lab 07/13/16 0746 07/13/16 1146 07/13/16 1759 07/13/16 2300 07/14/16 0812  GLUCAP 87 121* 311* 125* 152*    Recent Results (from the past 240 hour(s))  MRSA PCR Screening     Status: None   Collection Time: 07/12/16 11:59 PM  Result Value Ref Range Status   MRSA by PCR NEGATIVE NEGATIVE Final    Comment:        The GeneXpert MRSA Assay (FDA approved for NASAL specimens only), is one component of a comprehensive MRSA colonization surveillance program. It is not intended to diagnose MRSA infection nor to guide or monitor treatment for MRSA infections.      Studies: Ct Abdomen Pelvis Wo Contrast  Result Date: 07/12/2016 CLINICAL DATA:  60 year old male with acute renal failure, anuria and constipation for 7 days. Patient with bladder cancer, currently on chemotherapy. EXAM: CT ABDOMEN AND PELVIS WITHOUT CONTRAST TECHNIQUE: Multidetector CT imaging of the abdomen and pelvis was performed following the standard protocol without IV contrast. COMPARISON:  03/19/2016 FINDINGS: Please note that parenchymal abnormalities may be missed without intravenous contrast. Lower chest: No acute abnormality. Hepatobiliary: A right hepatic cyst is again  noted. No other hepatic or gallbladder abnormalities identified. There is no evidence of biliary dilatation. Pancreas: Unremarkable Spleen: Unremarkable Adrenals/Urinary Tract: New moderate to severe bilateral hydroureteronephrosis to the bladder is present. This is caused by an enlarging bladder mass, now measuring 3.7 x 8.3 cm with new masses/adenopathy throughout the pelvis. A conglomerate mass posterior to the bladder measures 6.5 x 11.1 cm. The adrenal glands are unremarkable. Stomach/Bowel: No bowel obstruction or focal bowel wall thickening. Vascular/Lymphatic: Aortic atherosclerotic calcifications noted without aneurysm. New lymphadenopathy throughout the pelvis identified. Other: No free fluid or pneumoperitoneum. Musculoskeletal: No acute bony abnormality. IMPRESSION: New moderate to severe bilateral hydronephrosis from significantly enlarged bladder mass/malignancy and metastatic disease/lymphadenopathy within the pelvis. Abdominal aortic atherosclerosis. Electronically Signed   By: Margarette Canada M.D.   On: 07/12/2016 19:21   Ir Nephrostomy Placement Left  Result Date: 07/13/2016 INDICATION: Rapidly progressive bladder cancer, now with bilateral hydronephrosis secondary to obstructive uropathy  with the uremia. Please perform ultrasound and fluoroscopic guided bilateral nephrostomy catheter placement. EXAM: 1. ULTRASOUND GUIDANCE FOR PUNCTURE OF THE BILATERAL RENAL COLLECTING SYSTEMS 2. BILATERAL PERCUTANEOUS NEPHROSTOMY TUBE PLACEMENT. COMPARISON:  CT abdomen pelvis - 07/12/2016; 03/19/2016 MEDICATIONS: Ciprofloxacin 400 mg IV; The antibiotic was administered in an appropriate time frame prior to skin puncture. ANESTHESIA/SEDATION: Fentanyl 100 mcg IV; Versed 4 mg IV Total Moderate Sedation Time 26 minutes. CONTRAST:  A total of 20 mL Isovue 300 was administered administered into the bilateral renal collecting systems FLUOROSCOPY TIME:  4 minutes 6 seconds (Q000111Q mGy) COMPLICATIONS: None immediate.  PROCEDURE: The procedure, risks, benefits, and alternatives were explained to the patient's wife. Questions regarding the procedure were encouraged and answered. The patient's wife understands and consents to the procedure. A timeout was performed prior to the initiation of the procedure. The bilateral flanks were prepped with Betadine in a sterile fashion, and a sterile drape was applied covering the operative field. A sterile gown and sterile gloves were used for the procedure. Local anesthesia was provided with 1% Lidocaine with epinephrine. Beginning with the left kidney, under direct ultrasound guidance, a 21 gauge needle was advanced into the renal collecting system. An ultrasound image documentation was performed. Access within the collecting system was confirmed with the efflux of urine followed by contrast injection. Over a Nitrex wire, the track was dilated with an Accustick set. Over a guide wire, a 10-French percutaneous nephrostomy catheter was advanced into the collecting system where the coil was formed and locked. Contrast was injected and several sport radiographs were obtained in various obliquities confirming access. The catheter was secured at the skin with a Prolene retention suture and a gravity bag was placed. Attention was now paid towards the right-sided nephrostomy. Again, a 21 gauge needle was advanced into the renal collecting system. An ultrasound image documentation was performed. Access within the collecting system was confirmed with the efflux of urine followed by contrast injection. Over a Nitrex wire, the track was dilated with an Accustick set. Over a guide wire, a 10-French percutaneous nephrostomy catheter was advanced into the collecting system where the coil was formed and locked. Contrast was injected and several sport radiographs were obtained in various obliquities confirming access. The catheter was secured at the skin with a Prolene retention suture and a gravity bag was  placed. Dressings were placed. The patient tolerated the procedure well without immediate postprocedural complication. FINDINGS: Ultrasound scanning demonstrates a mild to slightly moderately dilated collecting system. Under direct ultrasound guidance, a posterior inferior calix was targeted bilaterally allowing advancement of 10-French percutaneous nephrostomy catheters bilaterally under intermittent fluoroscopic guidance. Contrast injection confirmed appropriate positioning. IMPRESSION: Successful ultrasound and fluoroscopic guided placement of bilateral 10 French PCNs. Electronically Signed   By: Sandi Mariscal M.D.   On: 07/13/2016 08:08   Ir Nephrostomy Placement Right  Result Date: 07/13/2016 INDICATION: Rapidly progressive bladder cancer, now with bilateral hydronephrosis secondary to obstructive uropathy with the uremia. Please perform ultrasound and fluoroscopic guided bilateral nephrostomy catheter placement. EXAM: 1. ULTRASOUND GUIDANCE FOR PUNCTURE OF THE BILATERAL RENAL COLLECTING SYSTEMS 2. BILATERAL PERCUTANEOUS NEPHROSTOMY TUBE PLACEMENT. COMPARISON:  CT abdomen pelvis - 07/12/2016; 03/19/2016 MEDICATIONS: Ciprofloxacin 400 mg IV; The antibiotic was administered in an appropriate time frame prior to skin puncture. ANESTHESIA/SEDATION: Fentanyl 100 mcg IV; Versed 4 mg IV Total Moderate Sedation Time 26 minutes. CONTRAST:  A total of 20 mL Isovue 300 was administered administered into the bilateral renal collecting systems FLUOROSCOPY TIME:  4 minutes 6  seconds (Q000111Q mGy) COMPLICATIONS: None immediate. PROCEDURE: The procedure, risks, benefits, and alternatives were explained to the patient's wife. Questions regarding the procedure were encouraged and answered. The patient's wife understands and consents to the procedure. A timeout was performed prior to the initiation of the procedure. The bilateral flanks were prepped with Betadine in a sterile fashion, and a sterile drape was applied covering the  operative field. A sterile gown and sterile gloves were used for the procedure. Local anesthesia was provided with 1% Lidocaine with epinephrine. Beginning with the left kidney, under direct ultrasound guidance, a 21 gauge needle was advanced into the renal collecting system. An ultrasound image documentation was performed. Access within the collecting system was confirmed with the efflux of urine followed by contrast injection. Over a Nitrex wire, the track was dilated with an Accustick set. Over a guide wire, a 10-French percutaneous nephrostomy catheter was advanced into the collecting system where the coil was formed and locked. Contrast was injected and several sport radiographs were obtained in various obliquities confirming access. The catheter was secured at the skin with a Prolene retention suture and a gravity bag was placed. Attention was now paid towards the right-sided nephrostomy. Again, a 21 gauge needle was advanced into the renal collecting system. An ultrasound image documentation was performed. Access within the collecting system was confirmed with the efflux of urine followed by contrast injection. Over a Nitrex wire, the track was dilated with an Accustick set. Over a guide wire, a 10-French percutaneous nephrostomy catheter was advanced into the collecting system where the coil was formed and locked. Contrast was injected and several sport radiographs were obtained in various obliquities confirming access. The catheter was secured at the skin with a Prolene retention suture and a gravity bag was placed. Dressings were placed. The patient tolerated the procedure well without immediate postprocedural complication. FINDINGS: Ultrasound scanning demonstrates a mild to slightly moderately dilated collecting system. Under direct ultrasound guidance, a posterior inferior calix was targeted bilaterally allowing advancement of 10-French percutaneous nephrostomy catheters bilaterally under intermittent  fluoroscopic guidance. Contrast injection confirmed appropriate positioning. IMPRESSION: Successful ultrasound and fluoroscopic guided placement of bilateral 10 French PCNs. Electronically Signed   By: Sandi Mariscal M.D.   On: 07/13/2016 08:08    Scheduled Meds: . amitriptyline  25 mg Oral QHS  . atorvastatin  40 mg Oral Daily  . docusate sodium  100 mg Oral BID  . enoxaparin (LOVENOX) injection  30 mg Subcutaneous Q24H  . hydrocortisone   Rectal TID  . insulin aspart  0-9 Units Subcutaneous TID WC  . mouth rinse  15 mL Mouth Rinse BID  . metoprolol  50 mg Oral BID  . pantoprazole  80 mg Oral Daily  . polyethylene glycol  17 g Oral Daily  . QUEtiapine  25 mg Oral BID  . sodium chloride flush  3 mL Intravenous Q12H   Continuous Infusions: . sodium chloride 125 mL/hr at 07/14/16 0203    Principal Problem:   Acute renal failure (ARF) (HCC) Active Problems:   Malignant neoplasm of urinary bladder (HCC)   Nonspecific abnormal electrocardiogram (ECG) (EKG)   Hyperkalemia   Elevated lactic acid level   Uremia   Acute bilateral obstructive uropathy   Anemia of chronic disease   Hypocalcemia    Time spent: 35 minutes    Barton Dubois  [facility] Pager [phone]. If 7PM-7AM, please contact night-coverage at [email or web address], password [facility] 07/14/2016, 9:28 AM  LOS: 2 days

## 2016-07-14 NOTE — Telephone Encounter (Signed)
Routed to Dr Brigitte Pulse

## 2016-07-15 DIAGNOSIS — E876 Hypokalemia: Secondary | ICD-10-CM

## 2016-07-15 DIAGNOSIS — D649 Anemia, unspecified: Secondary | ICD-10-CM

## 2016-07-15 DIAGNOSIS — G9341 Metabolic encephalopathy: Secondary | ICD-10-CM

## 2016-07-15 LAB — BASIC METABOLIC PANEL
ANION GAP: 10 (ref 5–15)
BUN: 31 mg/dL — ABNORMAL HIGH (ref 6–20)
CALCIUM: 7.4 mg/dL — AB (ref 8.9–10.3)
CHLORIDE: 115 mmol/L — AB (ref 101–111)
CO2: 16 mmol/L — AB (ref 22–32)
Creatinine, Ser: 2.75 mg/dL — ABNORMAL HIGH (ref 0.61–1.24)
GFR calc Af Amer: 27 mL/min — ABNORMAL LOW (ref >60)
GFR calc non Af Amer: 24 mL/min — ABNORMAL LOW (ref >60)
GLUCOSE: 121 mg/dL — AB (ref 65–99)
Potassium: 3 mmol/L — ABNORMAL LOW (ref 3.5–5.1)
Sodium: 141 mmol/L (ref 135–145)

## 2016-07-15 LAB — GLUCOSE, CAPILLARY
GLUCOSE-CAPILLARY: 197 mg/dL — AB (ref 65–99)
Glucose-Capillary: 191 mg/dL — ABNORMAL HIGH (ref 65–99)
Glucose-Capillary: 127 mg/dL — ABNORMAL HIGH (ref 65–99)
Glucose-Capillary: 213 mg/dL — ABNORMAL HIGH (ref 65–99)

## 2016-07-15 LAB — CBC
HEMATOCRIT: 22.3 % — AB (ref 39.0–52.0)
HEMOGLOBIN: 7.3 g/dL — AB (ref 13.0–17.0)
MCH: 29 pg (ref 26.0–34.0)
MCHC: 32.7 g/dL (ref 30.0–36.0)
MCV: 88.5 fL (ref 78.0–100.0)
Platelets: 210 10*3/uL (ref 150–400)
RBC: 2.52 MIL/uL — ABNORMAL LOW (ref 4.22–5.81)
RDW: 18.5 % — AB (ref 11.5–15.5)
WBC: 11.3 10*3/uL — AB (ref 4.0–10.5)

## 2016-07-15 LAB — ABO/RH: ABO/RH(D): O POS

## 2016-07-15 LAB — PREPARE RBC (CROSSMATCH)

## 2016-07-15 LAB — MAGNESIUM: Magnesium: 0.9 mg/dL — CL (ref 1.7–2.4)

## 2016-07-15 MED ORDER — FUROSEMIDE 10 MG/ML IJ SOLN
20.0000 mg | Freq: Once | INTRAMUSCULAR | Status: AC
  Administered 2016-07-15: 20 mg via INTRAVENOUS
  Filled 2016-07-15: qty 2

## 2016-07-15 MED ORDER — SODIUM CHLORIDE 0.9 % IV SOLN
Freq: Once | INTRAVENOUS | Status: AC
  Administered 2016-07-15: 14:00:00 via INTRAVENOUS

## 2016-07-15 MED ORDER — POTASSIUM CHLORIDE CRYS ER 20 MEQ PO TBCR
40.0000 meq | EXTENDED_RELEASE_TABLET | ORAL | Status: AC
  Administered 2016-07-15 (×2): 40 meq via ORAL
  Filled 2016-07-15 (×2): qty 2

## 2016-07-15 MED ORDER — HALOPERIDOL LACTATE 5 MG/ML IJ SOLN
1.0000 mg | INTRAMUSCULAR | Status: DC | PRN
  Administered 2016-07-15 – 2016-07-17 (×5): 1 mg via INTRAVENOUS
  Filled 2016-07-15 (×5): qty 1

## 2016-07-15 MED ORDER — LORAZEPAM 2 MG/ML IJ SOLN: 1.0000 mg | Freq: Once | INTRAMUSCULAR | Status: DC

## 2016-07-15 MED ORDER — MAGNESIUM SULFATE 2 GM/50ML IV SOLN
2.0000 g | Freq: Once | INTRAVENOUS | Status: AC
  Administered 2016-07-15: 2 g via INTRAVENOUS
  Filled 2016-07-15: qty 50

## 2016-07-15 NOTE — Telephone Encounter (Signed)
06/2016 last ov labwork this month

## 2016-07-15 NOTE — Progress Notes (Signed)
[facility] PROGRESS NOTE  RAJENDER SOKOLSKI [id number] DOB: 02-Mar-1956 DOA: 07/12/2016 PCP: [name], MD  Interim summary and HPI 60 y.o. male with medical history significant of bladder cancer, DM, CAD s/p CABG, HTN, and HLD presenting with abnormal labs.   Patient with routine labs today, K+ and creatinine elevated.  Vomiting, inability to urinate.  Inability to urinate since Monday definitely, but wife is unsure if maybe longer.  Intermittent vomiting for the last week.  +confusion, waxing and waning for over a month now. Anorectal pain and anxiety reported as well.  Assessment/Plan: 1-acute renal failure: due to hydronephrosis and tumor burden obstructing outlet -patient status post bilateral PCNT by IR on 11/30 -continue draining properly and with great output -Cr rapidly trending down (down to 2.75 now) -will continue IVF's and supportive care -no further interventions planned at this moment  -continue PRN antiemetics and analgesics  -will follow urology rec's  2-Metabolic encephalopathy/confusion  -due to uremia most likely; also with concerns for hospital acquired delirium and medications -will continue holding bezo's, minimize IV narcotics and continue seroquel  -continue PRN haldol and bed side sitter ordered as well -will monitor mentation closely  3-hyperkalemia and hypocalcemia -treated in ED and received calcium gluconate -K 3.0 this morning -will monitor and replete as needed  -continue telemetry monitoring   4-Malignant bladder neoplasm -will follow oncology and urology rec's -prognosis appears gray right now -patient has essentially failed chemotherapy -per Dr. Alen Blew very little to be offer on radiation or even salvage chemo -urology to see for potential surgical palliation (if that is even an option)  5-anemia of chronic disease -Hgb down to 7.3, patient tachycardic  -will transfuse 1 unit  -will monitor Hgb trend   6-type 2 diabetes -will hold  oral hypoglycemic agents and continue using SSI  7-chronic pain syndrome -associated with cancer -will continue home oxycodone -will also continue anusol cream   8-lactic acidosis -due to ARF and dehydration -will continue IVF's -lactic acid WNL now  9-hypomagnesemia -will monitor and replete as needed    Code Status: Full Family Communication: wife at bedside  Disposition Plan: will transfer to telemetry bed; will follow/replete electrolytes and monitor renal function. Will follow urology and oncology rec's. Non toxic and with good output (through PCNT).   Consultants:  Urology  Oncology  IR  Procedures:  PCNT placement on 11/30  Antibiotics:  Received Ciprofloxacin prophylactically for PCNT placement  11/30  HPI/Subjective: Afebrile, no CP, no SOB. Patient is still confuse, but better and less agitated. Tachycardic (sinus rhythm).  Objective: Vitals:   07/15/16 0700 07/15/16 0800  BP: (!) 102/56 128/65  Pulse: (!) 108 (!) 128  Resp: (!) 21 (!) 22  Temp:  97.9 F (36.6 C)    Intake/Output Summary (Last 24 hours) at 07/15/16 0957 Last data filed at 07/15/16 0800  Gross per 24 hour  Intake             3895 ml  Output             3575 ml  Net              320 ml   Filed Weights   07/13/16 0128 07/13/16 0701  Weight: 93.7 kg (206 lb 9.1 oz) 99.6 kg (219 lb 9.3 oz)    Exam:   General:  Afebrile, still confused, but better (was able to remember full name and place); no CP and no SOB. Less agitated, following commands better and w/o abd pain, nausea or  vomiting.    Cardiovascular: S1 and S2, sinus tachycardic, no rubs or gallops  Respiratory: good air movement, no wheezing, no crackles, patient with good O2 sat on RA and not using accessory muscles   Abdomen: soft, NT, ND, positive BS  Skin: percutaneous nephrostomy tube in place bilaterally, good flow, clear urine seen, no blood appreciated.  Musculoskeletal: no edema or cyanosis   Data  Reviewed: Basic Metabolic Panel:  Recent Labs Lab 07/12/16 1757 07/12/16 1806 07/13/16 0027 07/13/16 0327 07/14/16 0518 07/15/16 0512  NA 134* 131* 136 136 144 141  K 5.4* 5.3* 4.4 4.7 4.5 3.0*  CL 97* 98* 98* 99* 111 115*  CO2 16*  --  16* 19* 21* 16*  GLUCOSE 164* 153* 60* 132* 157* 121*  BUN 103* 102* 99* 98* 51* 31*  CREATININE 16.78* >18.00* 15.42* 14.63* 6.20* 2.75*  CALCIUM 8.4*  --  8.3* 8.2* 8.8* 7.4*  MG  --   --   --   --   --  0.9*   Liver Function Tests:  Recent Labs Lab 07/12/16 1132 07/12/16 1757  AST 24 24  ALT <6 7*  ALKPHOS 158* 111  BILITOT 0.51 0.8  PROT 8.2 7.5  ALBUMIN 3.0* 3.3*   CBC:  Recent Labs Lab 07/12/16 1132 07/12/16 1757 07/12/16 1806 07/13/16 0327 07/15/16 0512  WBC 13.1* 9.5  --  9.8 11.3*  NEUTROABS 10.3* 7.7  --   --   --   HGB 9.9* 8.8* 8.8* 8.0* 7.3*  HCT 29.3* 26.8* 26.0* 24.5* 22.3*  MCV 84.9 85.4  --  83.6 88.5  PLT 344 291  --  265 210   Cardiac Enzymes:  Recent Labs Lab 07/13/16 0327 07/13/16 0913 07/13/16 1528  TROPONINI 0.07* 0.13* 0.15*   CBG:  Recent Labs Lab 07/14/16 0812 07/14/16 1143 07/14/16 1710 07/14/16 2138 07/15/16 0750  GLUCAP 152* 195* 170* 120* 191*    Recent Results (from the past 240 hour(s))  MRSA PCR Screening     Status: None   Collection Time: 07/12/16 11:59 PM  Result Value Ref Range Status   MRSA by PCR NEGATIVE NEGATIVE Final    Comment:        The GeneXpert MRSA Assay (FDA approved for NASAL specimens only), is one component of a comprehensive MRSA colonization surveillance program. It is not intended to diagnose MRSA infection nor to guide or monitor treatment for MRSA infections.      Studies: No results found.  Scheduled Meds: . sodium chloride   Intravenous Once  . amitriptyline  25 mg Oral QHS  . atorvastatin  40 mg Oral Daily  . docusate sodium  100 mg Oral BID  . enoxaparin (LOVENOX) injection  30 mg Subcutaneous Q24H  . furosemide  20 mg  Intravenous Once  . hydrocortisone   Rectal TID  . insulin aspart  0-9 Units Subcutaneous TID WC  . LORazepam  1 mg Intravenous Once  . magnesium sulfate 1 - 4 g bolus IVPB  2 g Intravenous Once  . mouth rinse  15 mL Mouth Rinse BID  . metoprolol  50 mg Oral BID  . pantoprazole  80 mg Oral Daily  . polyethylene glycol  17 g Oral Daily  . potassium chloride  40 mEq Oral Q4H  . QUEtiapine  25 mg Oral BID  . sodium chloride flush  3 mL Intravenous Q12H   Continuous Infusions: . sodium chloride 125 mL/hr at 07/15/16 0800    Principal Problem:   Acute  renal failure (ARF) (HCC) Active Problems:   Malignant neoplasm of urinary bladder (HCC)   Nonspecific abnormal electrocardiogram (ECG) (EKG)   Hyperkalemia   Elevated lactic acid level   Uremia   Acute bilateral obstructive uropathy   Anemia of chronic disease   Hypocalcemia    Time spent: 35 minutes    Barton Dubois  [facility] Pager [phone]. If 7PM-7AM, please contact night-coverage at [email or web address], password [facility] 07/15/2016, 9:57 AM  LOS: 3 days

## 2016-07-15 NOTE — Progress Notes (Signed)
CRITICAL VALUE ALERT  Critical value received:  Magnesium 0.9  Date of notification:  07/15/2016  Time of notification:  0950  Critical value read back:Yes.    Nurse who received alert:  Jacklynn Lewis RN  MD notified (1st page):  Dr. Dyann Kief Text page  Time of first page:  [phone] Text Page  MD notified (2nd page):  Time of second page:  Responding MD:  Dr. Dyann Kief  Time MD responded:  [id number]

## 2016-07-16 ENCOUNTER — Ambulatory Visit: Payer: 59

## 2016-07-16 DIAGNOSIS — N1330 Unspecified hydronephrosis: Secondary | ICD-10-CM

## 2016-07-16 DIAGNOSIS — C799 Secondary malignant neoplasm of unspecified site: Secondary | ICD-10-CM

## 2016-07-16 LAB — BASIC METABOLIC PANEL
Anion gap: 11 (ref 5–15)
BUN: 28 mg/dL — AB (ref 6–20)
CHLORIDE: 108 mmol/L (ref 101–111)
CO2: 20 mmol/L — ABNORMAL LOW (ref 22–32)
CREATININE: 2.37 mg/dL — AB (ref 0.61–1.24)
Calcium: 8 mg/dL — ABNORMAL LOW (ref 8.9–10.3)
GFR calc Af Amer: 33 mL/min — ABNORMAL LOW (ref >60)
GFR calc non Af Amer: 28 mL/min — ABNORMAL LOW (ref >60)
GLUCOSE: 120 mg/dL — AB (ref 65–99)
POTASSIUM: 3.1 mmol/L — AB (ref 3.5–5.1)
Sodium: 139 mmol/L (ref 135–145)

## 2016-07-16 LAB — TYPE AND SCREEN
ABO/RH(D): O POS
ANTIBODY SCREEN: NEGATIVE
UNIT DIVISION: 0

## 2016-07-16 LAB — GLUCOSE, CAPILLARY
GLUCOSE-CAPILLARY: 123 mg/dL — AB (ref 65–99)
GLUCOSE-CAPILLARY: 223 mg/dL — AB (ref 65–99)
GLUCOSE-CAPILLARY: 244 mg/dL — AB (ref 65–99)
Glucose-Capillary: 115 mg/dL — ABNORMAL HIGH (ref 65–99)
Glucose-Capillary: 127 mg/dL — ABNORMAL HIGH (ref 65–99)

## 2016-07-16 LAB — CBC
HEMATOCRIT: 25.2 % — AB (ref 39.0–52.0)
Hemoglobin: 8.5 g/dL — ABNORMAL LOW (ref 13.0–17.0)
MCH: 29.4 pg (ref 26.0–34.0)
MCHC: 33.7 g/dL (ref 30.0–36.0)
MCV: 87.2 fL (ref 78.0–100.0)
PLATELETS: 252 10*3/uL (ref 150–400)
RBC: 2.89 MIL/uL — ABNORMAL LOW (ref 4.22–5.81)
RDW: 17.6 % — AB (ref 11.5–15.5)
WBC: 10.3 10*3/uL (ref 4.0–10.5)

## 2016-07-16 LAB — MAGNESIUM: Magnesium: 1.3 mg/dL — ABNORMAL LOW (ref 1.7–2.4)

## 2016-07-16 MED ORDER — POLYETHYLENE GLYCOL 3350 17 G PO PACK
17.0000 g | PACK | Freq: Every day | ORAL | Status: DC | PRN
  Administered 2016-07-17: 17 g via ORAL
  Filled 2016-07-16: qty 1

## 2016-07-16 MED ORDER — MAGNESIUM SULFATE 2 GM/50ML IV SOLN
2.0000 g | Freq: Once | INTRAVENOUS | Status: AC
  Administered 2016-07-16: 2 g via INTRAVENOUS
  Filled 2016-07-16: qty 50

## 2016-07-16 MED ORDER — ENOXAPARIN SODIUM 40 MG/0.4ML ~~LOC~~ SOLN
40.0000 mg | SUBCUTANEOUS | Status: DC
  Administered 2016-07-17 – 2016-07-18 (×2): 40 mg via SUBCUTANEOUS
  Filled 2016-07-16 (×3): qty 0.4

## 2016-07-16 MED ORDER — METHOCARBAMOL 500 MG PO TABS
500.0000 mg | ORAL_TABLET | Freq: Three times a day (TID) | ORAL | Status: DC | PRN
  Administered 2016-07-16 – 2016-07-19 (×9): 500 mg via ORAL
  Filled 2016-07-16 (×9): qty 1

## 2016-07-16 MED ORDER — DOCUSATE SODIUM 50 MG PO CAPS
50.0000 mg | ORAL_CAPSULE | Freq: Two times a day (BID) | ORAL | Status: DC
  Administered 2016-07-16 – 2016-07-18 (×6): 50 mg via ORAL
  Filled 2016-07-16 (×7): qty 1

## 2016-07-16 MED ORDER — POTASSIUM CHLORIDE CRYS ER 20 MEQ PO TBCR
40.0000 meq | EXTENDED_RELEASE_TABLET | ORAL | Status: AC
  Administered 2016-07-16 (×2): 40 meq via ORAL
  Filled 2016-07-16 (×2): qty 2

## 2016-07-16 NOTE — Progress Notes (Signed)
Patient ID: Alexander Wolf, male   DOB: 01-Nov-1955, 60 y.o.   MRN: [id number]    Referring Physician(s): Dr. Karmen Bongo  Supervising Physician: Sandi Mariscal  Patient Status: [facility] - In-pt  Chief Complaint: Bilateral hydronephrosis  Subjective: Patient feels much better today.  Seems less confused.  No c/o back pain.  Allergies: Patient has no known allergies.  Medications: Prior to Admission medications   Medication Sig Start Date End Date Taking? Authorizing Provider  amitriptyline (ELAVIL) 25 MG tablet Take 1 tablet (25 mg total) by mouth at bedtime. 06/13/16  Yes Shawnee Knapp, MD  atorvastatin (LIPITOR) 40 MG tablet Take 1 tablet (40 mg total) by mouth daily. 03/29/16  Yes Wardell Honour, MD  diazepam (VALIUM) 5 MG tablet Take 1 tab by mouth on an empty stomach 2 hrs prior to procedure. Repeat in 60 min if needed 06/28/16  Yes Shawnee Knapp, MD  docusate sodium (COLACE) 100 MG capsule Take 1 capsule (100 mg total) by mouth 2 (two) times daily as needed for mild constipation. Patient taking differently: Take 100 mg by mouth 2 (two) times daily.  04/07/16  Yes Shawnee Knapp, MD  lidocaine (XYLOCAINE) 5 % ointment Apply 1 application topically as needed. Patient taking differently: Apply 1 application topically as needed for mild pain.  06/29/16  Yes Shawnee Knapp, MD  lidocaine-prilocaine (EMLA) cream Apply to port before chemotherapy. 04/10/16  Yes Wyatt Portela, MD  metFORMIN (GLUCOPHAGE) 1000 MG tablet Take 1 tablet (1,000 mg total) by mouth 2 (two) times daily with a meal. 04/11/16  Yes Tereasa Coop, PA-C  metoprolol (LOPRESSOR) 50 MG tablet TAKE 1/2 TABLET BY MOUTH TWICE DAILY 06/13/16  Yes Shawnee Knapp, MD  Omega-3 Fatty Acids (FISH OIL) 300 MG CAPS Take 600 capsules by mouth 2 (two) times daily.    Yes Historical Provider, MD  omeprazole (PRILOSEC) 40 MG capsule TAKE 1 CAPSULE BY MOUTH EVERY DAY 03/29/16  Yes Wardell Honour, MD  oxyCODONE-acetaminophen (PERCOCET) 10-325 MG tablet Take 1-2  tablets by mouth every 4 (four) hours as needed for pain. 06/28/16  Yes Shawnee Knapp, MD  polyethylene glycol powder (GLYCOLAX/MIRALAX) powder Take 17 g by mouth 4 (four) times daily as needed. Patient taking differently: Take 17 g by mouth 4 (four) times daily as needed for mild constipation.  04/14/16  Yes Shawnee Knapp, MD  prochlorperazine (COMPAZINE) 10 MG tablet Take 1 tablet (10 mg total) by mouth every 6 (six) hours as needed for nausea or vomiting. 06/14/16  Yes Wyatt Portela, MD  zolpidem (AMBIEN) 10 MG tablet TAKE 1 TABLET BY MOUTH EVERY NIGHT AT BEDTIME AS NEEDED FOR SLEEP 06/05/16  Yes Shawnee Knapp, MD  glipiZIDE (GLUCOTROL) 5 MG tablet TAKE 1 TABLET BY MOUTH TWICE DAILY BEFORE A MEAL 07/15/16   Shawnee Knapp, MD    Vital Signs: BP 125/67 (BP Location: Left Arm)   Pulse 82   Temp 98.1 F (36.7 C) (Oral)   Resp 16   Ht 5\' 10"  (1.778 m)   Wt 219 lb 9.3 oz (99.6 kg)   SpO2 98%   BMI 31.51 kg/m   Physical Exam: Abd: bilateral PCNs in place.  Clear yellow urine output.  Right PCN with 3825cc yesterday, left PCN with 2625cc.  Imaging: Ct Abdomen Pelvis Wo Contrast  Result Date: 07/12/2016 CLINICAL DATA:  60 year old male with acute renal failure, anuria and constipation for 7 days. Patient with bladder cancer, currently on  chemotherapy. EXAM: CT ABDOMEN AND PELVIS WITHOUT CONTRAST TECHNIQUE: Multidetector CT imaging of the abdomen and pelvis was performed following the standard protocol without IV contrast. COMPARISON:  03/19/2016 FINDINGS: Please note that parenchymal abnormalities may be missed without intravenous contrast. Lower chest: No acute abnormality. Hepatobiliary: A right hepatic cyst is again noted. No other hepatic or gallbladder abnormalities identified. There is no evidence of biliary dilatation. Pancreas: Unremarkable Spleen: Unremarkable Adrenals/Urinary Tract: New moderate to severe bilateral hydroureteronephrosis to the bladder is present. This is caused by an enlarging  bladder mass, now measuring 3.7 x 8.3 cm with new masses/adenopathy throughout the pelvis. A conglomerate mass posterior to the bladder measures 6.5 x 11.1 cm. The adrenal glands are unremarkable. Stomach/Bowel: No bowel obstruction or focal bowel wall thickening. Vascular/Lymphatic: Aortic atherosclerotic calcifications noted without aneurysm. New lymphadenopathy throughout the pelvis identified. Other: No free fluid or pneumoperitoneum. Musculoskeletal: No acute bony abnormality. IMPRESSION: New moderate to severe bilateral hydronephrosis from significantly enlarged bladder mass/malignancy and metastatic disease/lymphadenopathy within the pelvis. Abdominal aortic atherosclerosis. Electronically Signed   By: Margarette Canada M.D.   On: 07/12/2016 19:21   Ir Nephrostomy Placement Left  Result Date: 07/13/2016 INDICATION: Rapidly progressive bladder cancer, now with bilateral hydronephrosis secondary to obstructive uropathy with the uremia. Please perform ultrasound and fluoroscopic guided bilateral nephrostomy catheter placement. EXAM: 1. ULTRASOUND GUIDANCE FOR PUNCTURE OF THE BILATERAL RENAL COLLECTING SYSTEMS 2. BILATERAL PERCUTANEOUS NEPHROSTOMY TUBE PLACEMENT. COMPARISON:  CT abdomen pelvis - 07/12/2016; 03/19/2016 MEDICATIONS: Ciprofloxacin 400 mg IV; The antibiotic was administered in an appropriate time frame prior to skin puncture. ANESTHESIA/SEDATION: Fentanyl 100 mcg IV; Versed 4 mg IV Total Moderate Sedation Time 26 minutes. CONTRAST:  A total of 20 mL Isovue 300 was administered administered into the bilateral renal collecting systems FLUOROSCOPY TIME:  4 minutes 6 seconds (Q000111Q mGy) COMPLICATIONS: None immediate. PROCEDURE: The procedure, risks, benefits, and alternatives were explained to the patient's wife. Questions regarding the procedure were encouraged and answered. The patient's wife understands and consents to the procedure. A timeout was performed prior to the initiation of the procedure. The  bilateral flanks were prepped with Betadine in a sterile fashion, and a sterile drape was applied covering the operative field. A sterile gown and sterile gloves were used for the procedure. Local anesthesia was provided with 1% Lidocaine with epinephrine. Beginning with the left kidney, under direct ultrasound guidance, a 21 gauge needle was advanced into the renal collecting system. An ultrasound image documentation was performed. Access within the collecting system was confirmed with the efflux of urine followed by contrast injection. Over a Nitrex wire, the track was dilated with an Accustick set. Over a guide wire, a 10-French percutaneous nephrostomy catheter was advanced into the collecting system where the coil was formed and locked. Contrast was injected and several sport radiographs were obtained in various obliquities confirming access. The catheter was secured at the skin with a Prolene retention suture and a gravity bag was placed. Attention was now paid towards the right-sided nephrostomy. Again, a 21 gauge needle was advanced into the renal collecting system. An ultrasound image documentation was performed. Access within the collecting system was confirmed with the efflux of urine followed by contrast injection. Over a Nitrex wire, the track was dilated with an Accustick set. Over a guide wire, a 10-French percutaneous nephrostomy catheter was advanced into the collecting system where the coil was formed and locked. Contrast was injected and several sport radiographs were obtained in various obliquities confirming access. The catheter was  secured at the skin with a Prolene retention suture and a gravity bag was placed. Dressings were placed. The patient tolerated the procedure well without immediate postprocedural complication. FINDINGS: Ultrasound scanning demonstrates a mild to slightly moderately dilated collecting system. Under direct ultrasound guidance, a posterior inferior calix was targeted  bilaterally allowing advancement of 10-French percutaneous nephrostomy catheters bilaterally under intermittent fluoroscopic guidance. Contrast injection confirmed appropriate positioning. IMPRESSION: Successful ultrasound and fluoroscopic guided placement of bilateral 10 French PCNs. Electronically Signed   By: Sandi Mariscal M.D.   On: 07/13/2016 08:08   Ir Nephrostomy Placement Right  Result Date: 07/13/2016 INDICATION: Rapidly progressive bladder cancer, now with bilateral hydronephrosis secondary to obstructive uropathy with the uremia. Please perform ultrasound and fluoroscopic guided bilateral nephrostomy catheter placement. EXAM: 1. ULTRASOUND GUIDANCE FOR PUNCTURE OF THE BILATERAL RENAL COLLECTING SYSTEMS 2. BILATERAL PERCUTANEOUS NEPHROSTOMY TUBE PLACEMENT. COMPARISON:  CT abdomen pelvis - 07/12/2016; 03/19/2016 MEDICATIONS: Ciprofloxacin 400 mg IV; The antibiotic was administered in an appropriate time frame prior to skin puncture. ANESTHESIA/SEDATION: Fentanyl 100 mcg IV; Versed 4 mg IV Total Moderate Sedation Time 26 minutes. CONTRAST:  A total of 20 mL Isovue 300 was administered administered into the bilateral renal collecting systems FLUOROSCOPY TIME:  4 minutes 6 seconds (Q000111Q mGy) COMPLICATIONS: None immediate. PROCEDURE: The procedure, risks, benefits, and alternatives were explained to the patient's wife. Questions regarding the procedure were encouraged and answered. The patient's wife understands and consents to the procedure. A timeout was performed prior to the initiation of the procedure. The bilateral flanks were prepped with Betadine in a sterile fashion, and a sterile drape was applied covering the operative field. A sterile gown and sterile gloves were used for the procedure. Local anesthesia was provided with 1% Lidocaine with epinephrine. Beginning with the left kidney, under direct ultrasound guidance, a 21 gauge needle was advanced into the renal collecting system. An ultrasound  image documentation was performed. Access within the collecting system was confirmed with the efflux of urine followed by contrast injection. Over a Nitrex wire, the track was dilated with an Accustick set. Over a guide wire, a 10-French percutaneous nephrostomy catheter was advanced into the collecting system where the coil was formed and locked. Contrast was injected and several sport radiographs were obtained in various obliquities confirming access. The catheter was secured at the skin with a Prolene retention suture and a gravity bag was placed. Attention was now paid towards the right-sided nephrostomy. Again, a 21 gauge needle was advanced into the renal collecting system. An ultrasound image documentation was performed. Access within the collecting system was confirmed with the efflux of urine followed by contrast injection. Over a Nitrex wire, the track was dilated with an Accustick set. Over a guide wire, a 10-French percutaneous nephrostomy catheter was advanced into the collecting system where the coil was formed and locked. Contrast was injected and several sport radiographs were obtained in various obliquities confirming access. The catheter was secured at the skin with a Prolene retention suture and a gravity bag was placed. Dressings were placed. The patient tolerated the procedure well without immediate postprocedural complication. FINDINGS: Ultrasound scanning demonstrates a mild to slightly moderately dilated collecting system. Under direct ultrasound guidance, a posterior inferior calix was targeted bilaterally allowing advancement of 10-French percutaneous nephrostomy catheters bilaterally under intermittent fluoroscopic guidance. Contrast injection confirmed appropriate positioning. IMPRESSION: Successful ultrasound and fluoroscopic guided placement of bilateral 10 French PCNs. Electronically Signed   By: Sandi Mariscal M.D.   On: 07/13/2016 08:08  Labs:  CBC:  Recent Labs   07/12/16 1757 07/12/16 1806 07/13/16 0327 07/15/16 0512 07/16/16 0555  WBC 9.5  --  9.8 11.3* 10.3  HGB 8.8* 8.8* 8.0* 7.3* 8.5*  HCT 26.8* 26.0* 24.5* 22.3* 25.2*  PLT 291  --  265 210 252    COAGS:  Recent Labs  04/19/16 1245 07/12/16 1757  INR 0.98 1.19    BMP:  Recent Labs  07/13/16 0327 07/14/16 0518 07/15/16 0512 07/16/16 0555  NA 136 144 141 139  K 4.7 4.5 3.0* 3.1*  CL 99* 111 115* 108  CO2 19* 21* 16* 20*  GLUCOSE 132* 157* 121* 120*  BUN 98* 51* 31* 28*  CALCIUM 8.2* 8.8* 7.4* 8.0*  CREATININE 14.63* 6.20* 2.75* 2.37*  GFRNONAA 3* 9* 24* 28*  GFRAA 4* 10* 27* 33*    LIVER FUNCTION TESTS:  Recent Labs  06/05/16 0800 06/12/16 1218 07/12/16 1132 07/12/16 1757  BILITOT 0.23 0.36 0.51 0.8  AST 24 20 24 24   ALT 21 17 <6 7*  ALKPHOS 138 148 158* 111  PROT 7.7 8.3 8.2 7.5  ALBUMIN 3.0* 3.3* 3.0* 3.3*    Assessment and Plan: 1. Bilateral hydronephrosis, s/p PCN placements on 11/30 by Pascal Lux -PCNs look good with good output. -cont routine PCN care, irrigate both drains q shift. -further plans per urology. -we will follow  Electronically Signed: Kreston Ahrendt E 07/16/2016, 3:35 PM   I spent a total of 15 Minutes at the the patient's bedside AND on the patient's hospital floor or unit, greater than 50% of which was counseling/coordinating care for bilateral hydronephrosis

## 2016-07-16 NOTE — Consult Note (Signed)
Reason for Consult:Worsening Locally Advanced Bladder Cancer, Acute Renal Failure with Malignant Ureteral Obstruction, Nephrolithiasis, Renal Cysts  Referring Physician: Barton Dubois MD  Alexander Wolf is an 60 y.o. male.   HPI:   1 - Stage 4 Bladder Cancer - H/o high grade cancer originally managed by Dr. [place] Wolf in [facility] s/p TURBT x2 around 2014 but patient did not follow up. Now with T4 disease by TURBT/TURP 03/2016 (signifiant prostatic stromal invovlment). CT at that time clinically localized but significant soft tissue tranding around bladder neck prostate consistent with T3/T4 disease, no obvious pelvic adenopathy. Completed several cycles of neo-adjuvant gem-cis under care of Dr. Alen Wolf and was planning on curative intent cystoprostatectomy  (surgery was scheduled) but patient and family decided to switch providers again to [facility].  Recent Course: 07/2016 - CT with rapidly progressive locally advanced disease, new 11cm perirectal mass with rectal compression, 8cm bladder-prostate mass with malignant ureteral obstruction. New pelvic adenopathy. New sacral mass.    2 - Bilateral Non-Complex renal Cysts - Right upper x2, Left upper x 1 <2cm cortical renal cysts w/o enhancement or mass effect by CT 2017.   3 - Nephrolithiasis - punctate right renal stones by CT 2017. No prior colic.   4 - Prostate Screening - PSA 0.71 2017.  5 - Acute Renal Failure / Bilateral Malignant Ureteral Obstruction  - Cr >18 with mild hyperkalemia and new bilateral malignant obstruction by intake labs. Bilateral neph tubes placed with rapid improvement in GFR but still compromised with Cr presently low 2's.   6- Cancer Related Pain - pt with severe pelvic / lower extremity pain. On PRN's with oxycodone at present.   PMH sig for DM2, HLD, HTN, CAD/CABG (now limiting whatsoever now), Anxiety/Insomnia (benzos, elevil). NO strong blood thinners. No prior abd surgery. His PCP is Alexander Cheadle MD.    Today "Alexander Wolf" is seen in consultation for above.    Past Medical History:  Diagnosis Date  . Bladder cancer ([place]) 2014   Alexander Wolf in [place], [place]; /  urologist in [place]-  dr Alexander Wolf; lost to f/u, redeveloped problems in 7/17  . BPH (benign prostatic hypertrophy) with urinary retention   . Coronary artery disease    s/p CABG  (per pt hasn't seen cardiologist for several years)  . Foley catheter in place   . GERD (gastroesophageal reflux disease)   . Hyperlipidemia   . Hypertension   . Insomnia   . 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 Type 2 diabetes mellitus ([place])     Past Surgical History:  Procedure Laterality Date  . CARDIAC CATHETERIZATION  10/26/2003  dr Alexander Wolf   ETT + ischemia/  severe 3 vessel avcad,  normal LVF, ef 55-60%  . CORONARY ARTERY BYPASS GRAFT  10-28-2003   dr Alexander Wolf   LIMA to dLAD,  RIMA to OM1,  SVG to OM2,  SVG to  right posterolateral   . CYSTOSCOPY N/A 03/29/2016   Procedure: CYSTOSCOPY;  Surgeon: Alexander Gallo, MD;  [facility];  Service: Urology;  Laterality: N/A;  . IR GENERIC HISTORICAL  04/19/2016   IR US GUIDE VASC ACCESS RIGHT 04/19/2016 Alexander Mckusick, DO [facility]  . IR GENERIC HISTORICAL  04/19/2016   IR FLUORO GUIDE PORT INSERTION RIGHT 04/19/2016 Alexander Mckusick, DO [facility]  . IR GENERIC HISTORICAL  07/12/2016   IR NEPHROSTOMY PLACEMENT RIGHT 07/12/2016 Alexander Mariscal, MD [facility]  . IR GENERIC HISTORICAL  07/12/2016  IR NEPHROSTOMY PLACEMENT LEFT 07/12/2016 Alexander Mariscal, MD [facility]  . TONSILLECTOMY  as child  . TRANSURETHRAL RESECTION OF BLADDER TUMOR  2014  . TRANSURETHRAL RESECTION OF BLADDER TUMOR WITH GYRUS (TURBT-GYRUS) N/A 03/29/2016   Procedure: TRANSURETHRAL RESECTION OF BLADDER TUMOR WITH GYRUS (TURBT-GYRUS);  Surgeon: Alexander Gallo, MD;  Location[facility];  Service: Urology;  Laterality: N/A;  . TRANSURETHRAL RESECTION OF PROSTATE N/A 03/29/2016   Procedure: TRANSURETHRAL  RESECTION OF THE PROSTATE (TURP);  Surgeon: Alexander Gallo, MD;  Location: [facility];  Service: Urology;  Laterality: N/A;    Family History  Problem Relation Age of Onset  . Varicose Veins Mother     Social History:  reports that he quit smoking about 4 years ago. His smoking use included Cigarettes. He has a 44.00 pack-year smoking history. He has never used smokeless tobacco. He reports that he does not drink alcohol or use drugs.  Allergies: No Known Allergies  Medications: I have reviewed the patient's current medications.  Results for orders placed or performed during the hospital encounter of 07/12/16 (from the past 48 hour(s))  Glucose, capillary     Status: Abnormal   Collection Time: 07/14/16  5:10 PM  Result Value Ref Range   Glucose-Capillary 170 (H) 65 - 99 mg/dL   Comment 1 Notify RN    Comment 2 Document in Chart   Glucose, capillary     Status: Abnormal   Collection Time: 07/14/16  9:38 PM  Result Value Ref Range   Glucose-Capillary 120 (H) 65 - 99 mg/dL   Comment 1 Notify RN    Comment 2 Document in Chart   CBC     Status: Abnormal   Collection Time: 07/15/16  5:12 AM  Result Value Ref Range   WBC 11.3 (H) 4.0 - 10.5 K/uL   RBC 2.52 (L) 4.22 - 5.81 MIL/uL   Hemoglobin 7.3 (L) 13.0 - 17.0 g/dL   HCT 22.3 (L) 39.0 - 52.0 %   MCV 88.5 78.0 - 100.0 fL   MCH 29.0 26.0 - 34.0 pg   MCHC 32.7 30.0 - 36.0 g/dL   RDW 18.5 (H) 11.5 - 15.5 %   Platelets 210 150 - 400 K/uL  Basic metabolic panel     Status: Abnormal   Collection Time: 07/15/16  5:12 AM  Result Value Ref Range   Sodium 141 135 - 145 mmol/L   Potassium 3.0 (L) 3.5 - 5.1 mmol/L    Comment: DELTA CHECK NOTED   Chloride 115 (H) 101 - 111 mmol/L   CO2 16 (L) 22 - 32 mmol/L   Glucose, Bld 121 (H) 65 - 99 mg/dL   BUN 31 (H) 6 - 20 mg/dL   Creatinine, Ser 2.75 (H) 0.61 - 1.24 mg/dL    Comment: DELTA CHECK NOTED REPEATED TO VERIFY    Calcium 7.4 (L) 8.9 - 10.3 mg/dL   GFR calc non  Af Amer 24 (L) >60 mL/min   GFR calc Af Amer 27 (L) >60 mL/min    Comment: (NOTE) The eGFR has been calculated using the CKD EPI equation. This calculation has not been validated in all clinical situations. eGFR's persistently <60 mL/min signify possible Chronic Kidney Disease.    Anion gap 10 5 - 15  Magnesium     Status: Abnormal   Collection Time: 07/15/16  5:12 AM  Result Value Ref Range   Magnesium 0.9 (LL) 1.7 - 2.4 mg/dL    Comment: CRITICAL RESULT CALLED TO,  READ BACK BY AND VERIFIED WITH: Wolf,S RN AT [phone] ON 12.3.17 BY EPPERSON,S   Glucose, capillary     Status: Abnormal   Collection Time: 07/15/16  7:50 AM  Result Value Ref Range   Glucose-Capillary 191 (H) 65 - 99 mg/dL  Prepare RBC     Status: None   Collection Time: 07/15/16  8:31 AM  Result Value Ref Range   Order Confirmation ORDER PROCESSED BY BLOOD BANK   Type and screen [place]     Status: None   Collection Time: 07/15/16 10:27 AM  Result Value Ref Range   ABO/RH(D) O POS    Antibody Screen NEG    Sample Expiration 07/18/2016    Unit Number [id number]    Blood Component Type RBC LR PHER2    Unit division 00    Status of Unit ISSUED,FINAL    Transfusion Status OK TO TRANSFUSE    Crossmatch Result Compatible   ABO/Rh     Status: None   Collection Time: 07/15/16 10:28 AM  Result Value Ref Range   ABO/RH(D) O POS   Glucose, capillary     Status: Abnormal   Collection Time: 07/15/16  1:20 PM  Result Value Ref Range   Glucose-Capillary 127 (H) 65 - 99 mg/dL   Comment 1 Notify RN    Comment 2 Document in Chart   Glucose, capillary     Status: Abnormal   Collection Time: 07/15/16  5:02 PM  Result Value Ref Range   Glucose-Capillary 213 (H) 65 - 99 mg/dL  Glucose, capillary     Status: Abnormal   Collection Time: 07/15/16  9:15 PM  Result Value Ref Range   Glucose-Capillary 197 (H) 65 - 99 mg/dL  Basic metabolic panel     Status: Abnormal   Collection Time: 07/16/16  5:55 AM   Result Value Ref Range   Sodium 139 135 - 145 mmol/L   Potassium 3.1 (L) 3.5 - 5.1 mmol/L   Chloride 108 101 - 111 mmol/L   CO2 20 (L) 22 - 32 mmol/L   Glucose, Bld 120 (H) 65 - 99 mg/dL   BUN 28 (H) 6 - 20 mg/dL   Creatinine, Ser 2.37 (H) 0.61 - 1.24 mg/dL   Calcium 8.0 (L) 8.9 - 10.3 mg/dL   GFR calc non Af Amer 28 (L) >60 mL/min   GFR calc Af Amer 33 (L) >60 mL/min    Comment: (NOTE) The eGFR has been calculated using the CKD EPI equation. This calculation has not been validated in all clinical situations. eGFR's persistently <60 mL/min signify possible Chronic Kidney Disease.    Anion gap 11 5 - 15  CBC     Status: Abnormal   Collection Time: 07/16/16  5:55 AM  Result Value Ref Range   WBC 10.3 4.0 - 10.5 K/uL   RBC 2.89 (L) 4.22 - 5.81 MIL/uL   Hemoglobin 8.5 (L) 13.0 - 17.0 g/dL   HCT 25.2 (L) 39.0 - 52.0 %   MCV 87.2 78.0 - 100.0 fL   MCH 29.4 26.0 - 34.0 pg   MCHC 33.7 30.0 - 36.0 g/dL   RDW 17.6 (H) 11.5 - 15.5 %   Platelets 252 150 - 400 K/uL  Magnesium     Status: Abnormal   Collection Time: 07/16/16  5:55 AM  Result Value Ref Range   Magnesium 1.3 (L) 1.7 - 2.4 mg/dL  Glucose, capillary     Status: Abnormal   Collection Time: 07/16/16  6:54 AM  Result Value Ref Range   Glucose-Capillary 115 (H) 65 - 99 mg/dL  Glucose, capillary     Status: Abnormal   Collection Time: 07/16/16  7:37 AM  Result Value Ref Range   Glucose-Capillary 127 (H) 65 - 99 mg/dL   Comment 1 Notify RN    Comment 2 Document in Chart   Glucose, capillary     Status: Abnormal   Collection Time: 07/16/16 12:50 PM  Result Value Ref Range   Glucose-Capillary 244 (H) 65 - 99 mg/dL   Comment 1 Notify RN    Comment 2 Document in Chart     No results found.  Review of Systems  Constitutional: Positive for malaise/fatigue.  HENT: Negative.   Eyes: Negative.   Respiratory: Negative.   Cardiovascular: Negative.   Gastrointestinal: Positive for constipation.  Genitourinary: Negative.    Musculoskeletal: Negative.   Skin: Negative.   Neurological: Negative.   Endo/Heme/Allergies: Negative.    Blood pressure 125/67, pulse 82, temperature 98.1 F (36.7 C), temperature source Oral, resp. rate 16, height '5\' 10"'  (1.778 m), weight 99.6 kg (219 lb 9.3 oz), SpO2 98 %. Physical Exam  Constitutional: He appears well-developed.  HENT:  Head: Normocephalic.  Eyes: Pupils are equal, round, and reactive to light.  Neck: Normal range of motion.  Cardiovascular: Normal rate.   Respiratory: Effort normal.  GI: Soft.  Genitourinary: Penis normal.  Genitourinary Comments: Palpable lower pelvic mass Rt > Lt  Musculoskeletal: Normal range of motion.  Neurological: He is alert.  Skin: Skin is warm.  Psychiatric: He has a normal mood and affect. His behavior is normal.    Assessment/Plan:  1 - Stage 4 Bladder Cancer -  Rapid progression despite chemotherapy. Prognosis grim. I feel there is no role for surgical extirpation at this point. Unfortunately he likely missed window of cure years ago.  Frankly discussed overall prognosis (likely <6-12 mo survival if no further therapy) and further management options including purely palliative (least aggressive) to chemo-radiation (most aggressive). He adamantly wants aggressive therapy understanding that very little chance of cure. Should he have dramatic response, he may become surgical candidate in future for possible cystoprostatectomy v. Total pelvic exenteration. I again expressed that this would be very unlikely.    2 - Bilateral Non-Complex renal Cysts - low risk and stable, observe.   3 - Nephrolithiasis - low risk and stable, observe.   4 - Prostate Screening - no indication for further PSA testing  5 - Acute Renal Failure / Bilateral Malignant Ureteral Obstruction  - agree with bilateral nephrostomy tubes. He will likely be dependant on these the rest of his life. No role for retrograde approach given volume of invasive pelvic  cancer.  6 - Cancer Related Pain - pt will likely require combination of long and short acting narcotics going forward.  I called pt's wife Beth per patient's request and also reiterated above findings, options, and plan and answered questions.  Will follow, please call me directly with questions anytime.  Adalai Perl 07/16/2016, 4:31 PM

## 2016-07-16 NOTE — Progress Notes (Signed)
IP PROGRESS NOTE  Subjective:   Mr. Crisantos feels reasonably well this morning without any recent complaints. He does have some hip and lower abdominal pain discomfort that is manageable at this time. He does not report any confusion or lethargy at this time.  Objective:  Vital signs in last 24 hours: Temp:  [97.8 F (36.6 C)-98.4 F (36.9 C)] 98.4 F (36.9 C) (12/04 0652) Pulse Rate:  [79-111] 111 (12/04 0900) Resp:  [14-26] 26 (12/04 0900) BP: (88-150)/(45-112) 113/53 (12/04 0900) SpO2:  [94 %-100 %] 95 % (12/04 0900) Weight change:  Last BM Date: 07/16/16  Intake/Output from previous day: 12/03 0701 - 12/04 0700 In: 3352 [I.V.:3000; Blood:352] Out: 6450 [Urine:6450] Comfortable without distress. Mouth: mucous membranes moist, pharynx normal without lesions Resp: clear to auscultation bilaterally Cardio: regular rate and rhythm, S1, S2 normal, no murmur, click, rub or gallop   Port-A-Cath in place without any erythema or induration. GI: soft, non-tender; bowel sounds normal; no masses,  no organomegaly Extremities: extremities normal, atraumatic, no cyanosis or edema    Lab Results:  Recent Labs  07/15/16 0512 07/16/16 0555  WBC 11.3* 10.3  HGB 7.3* 8.5*  HCT 22.3* 25.2*  PLT 210 252    BMET  Recent Labs  07/15/16 0512 07/16/16 0555  NA 141 139  K 3.0* 3.1*  CL 115* 108  CO2 16* 20*  GLUCOSE 121* 120*  BUN 31* 28*  CREATININE 2.75* 2.37*  CALCIUM 7.4* 8.0*    Studies/Results: No results found.  Medications: I have reviewed the patient's current medications.  Assessment/Plan:  60 year old gentleman with the following issues:  1. 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 He presented with a large tumor invading into the prostate. He received 3 cycles of neoadjuvant chemotherapy completed on 06/12/2016.  CT scan obtained on 07/12/2016 showed rapidly enlarging tumor with a bladder mass measuring 3.7 x 8.3 cm with no masses in  adenopathy throughout the pelvis. There is also a mass posterior to the bladder measuring 6.5 x 11.1 cm.  He will likely receive salvage chemotherapy once this episode have resolved. I see very little and curative surgery at this time but we will discuss with urology in a multidisciplinary fashion.  2. Acute renal failure: Kidney function appears to be improving.  3. Hydronephrosis: Related to bladder tumor and he is status post bilateral percutaneous nephrostomy tube.   4. Anemia: Related to acute renal failure as well as malignancy. Status post one unit of packed crit cell transfusion with improvement in his hemoglobin.  We'll arrange oncology follow up for him upon his discharge once he is medically stable.    LOS: 4 days   Lorrine Killilea 07/16/2016, 11:24 AM

## 2016-07-16 NOTE — Progress Notes (Signed)
[facility] PROGRESS NOTE  Alexander Wolf [id number] DOB: 1956-01-04 DOA: 07/12/2016 PCP: [name], MD  Interim summary and HPI 60 y.o. male with medical history significant of bladder cancer, DM, CAD s/p CABG, HTN, and HLD presenting with abnormal labs.   Patient with routine labs today, K+ and creatinine elevated.  Vomiting, inability to urinate.  Inability to urinate since Monday definitely, but wife is unsure if maybe longer.  Intermittent vomiting for the last week.  +confusion, waxing and waning for over a month now. Anorectal pain and anxiety reported as well.  Assessment/Plan: 1-acute renal failure: due to hydronephrosis and tumor burden obstructing outlet -patient status post bilateral PCNT by IR on 11/30 -continue draining properly and with great output -Cr rapidly trending down (down to 2.37 now) -will continue IVF's and supportive care -no further interventions planned at this moment  -continue PRN antiemetics and analgesics  -will follow urology rec's  2-Metabolic encephalopathy/confusion  -due to uremia most likely; also with concerns for hospital acquired delirium and medications -will continue holding bezo's, minimize IV narcotics and continue seroquel  -continue PRN haldol and supportive care/constant reorientation  -will monitor mentation closely; much improved today.  3-hyperkalemia and hypocalcemia -treated in ED and received calcium gluconate -will monitor and replete as needed  -continue telemetry monitoring   4-Malignant bladder neoplasm -will follow oncology and urology rec's -prognosis appears gray right now -patient has essentially failed chemotherapy -per Dr. Alen Blew very little to be offer on radiation or even salvage chemo -urology to see for potential surgical palliation (if that is even an option)  5-anemia of chronic disease -Hgb 8.5 after transfusing 1 unit  -will monitor Hgb trend  -no signs of acute bleeding  6-type 2  diabetes -will hold oral hypoglycemic agents and continue using SSI  7-chronic pain syndrome -associated with cancer -will continue home oxycodone -will also continue anusol cream   8-lactic acidosis -due to ARF and dehydration -will continue IVF's -lactic acid WNL now  9-hypomagnesemia/hypokalemia -will monitor and replete as needed  -K 3.1 and Mg 1.3  Code Status: Full Family Communication: wife at bedside  Disposition Plan: will transfer to telemetry bed; will follow/replete electrolytes and monitor renal function. Will follow urology and oncology rec's. Non toxic and with good output (through PCNT).   Consultants:  Urology  Oncology  IR  Procedures:  PCNT placement on 11/30  Antibiotics:  Received Ciprofloxacin prophylactically for PCNT placement  11/30  HPI/Subjective: Afebrile, no CP, no SOB. Patient with significant improvement in his mentation; less confuse and no presenting agitation. Reports lower back and thighs pain.   Objective: Vitals:   07/16/16 0800 07/16/16 0900  BP: (!) 112/57 (!) 113/53  Pulse: 100 (!) 111  Resp: (!) 23 (!) 26  Temp:      Intake/Output Summary (Last 24 hours) at 07/16/16 0927 Last data filed at 07/16/16 0900  Gross per 24 hour  Intake             3402 ml  Output             6750 ml  Net            -3348 ml   Filed Weights   07/13/16 0128 07/13/16 0701  Weight: 93.7 kg (206 lb 9.1 oz) 99.6 kg (219 lb 9.3 oz)    Exam:   General:  Afebrile, no CP, no SOB. Complaining of lower back and left thigh pain. Significant improvement in agitation and confusion. Following commands better and w/o abd  pain, nausea or vomiting.    Cardiovascular: S1 and S2, sinus tachycardic, no rubs or gallops  Respiratory: good air movement, no wheezing, no crackles, patient with good O2 sat on RA and not using accessory muscles   Abdomen: soft, NT, ND, positive BS  Skin: percutaneous nephrostomy tube in place bilaterally, good flow, clear  urine seen, no blood appreciated.  Musculoskeletal: no edema or cyanosis; reports pain in his thighs and lower back.   Data Reviewed: Basic Metabolic Panel:  Recent Labs Lab 07/13/16 0027 07/13/16 0327 07/14/16 0518 07/15/16 0512 07/16/16 0555  NA 136 136 144 141 139  K 4.4 4.7 4.5 3.0* 3.1*  CL 98* 99* 111 115* 108  CO2 16* 19* 21* 16* 20*  GLUCOSE 60* 132* 157* 121* 120*  BUN 99* 98* 51* 31* 28*  CREATININE 15.42* 14.63* 6.20* 2.75* 2.37*  CALCIUM 8.3* 8.2* 8.8* 7.4* 8.0*  MG  --   --   --  0.9* 1.3*   Liver Function Tests:  Recent Labs Lab 07/12/16 1132 07/12/16 1757  AST 24 24  ALT <6 7*  ALKPHOS 158* 111  BILITOT 0.51 0.8  PROT 8.2 7.5  ALBUMIN 3.0* 3.3*   CBC:  Recent Labs Lab 07/12/16 1132 07/12/16 1757 07/12/16 1806 07/13/16 0327 07/15/16 0512 07/16/16 0555  WBC 13.1* 9.5  --  9.8 11.3* 10.3  NEUTROABS 10.3* 7.7  --   --   --   --   HGB 9.9* 8.8* 8.8* 8.0* 7.3* 8.5*  HCT 29.3* 26.8* 26.0* 24.5* 22.3* 25.2*  MCV 84.9 85.4  --  83.6 88.5 87.2  PLT 344 291  --  265 210 252   Cardiac Enzymes:  Recent Labs Lab 07/13/16 0327 07/13/16 0913 07/13/16 1528  TROPONINI 0.07* 0.13* 0.15*   CBG:  Recent Labs Lab 07/15/16 1320 07/15/16 1702 07/15/16 2115 07/16/16 0654 07/16/16 0737  GLUCAP 127* 213* 197* 115* 127*    Recent Results (from the past 240 hour(s))  MRSA PCR Screening     Status: None   Collection Time: 07/12/16 11:59 PM  Result Value Ref Range Status   MRSA by PCR NEGATIVE NEGATIVE Final    Comment:        The GeneXpert MRSA Assay (FDA approved for NASAL specimens only), is one component of a comprehensive MRSA colonization surveillance program. It is not intended to diagnose MRSA infection nor to guide or monitor treatment for MRSA infections.      Studies: No results found.  Scheduled Meds: . amitriptyline  25 mg Oral QHS  . atorvastatin  40 mg Oral Daily  . docusate sodium  50 mg Oral BID  . enoxaparin  (LOVENOX) injection  30 mg Subcutaneous Q24H  . hydrocortisone   Rectal TID  . insulin aspart  0-9 Units Subcutaneous TID WC  . LORazepam  1 mg Intravenous Once  . mouth rinse  15 mL Mouth Rinse BID  . metoprolol  50 mg Oral BID  . pantoprazole  80 mg Oral Daily  . potassium chloride  40 mEq Oral Q4H  . QUEtiapine  25 mg Oral BID  . sodium chloride flush  3 mL Intravenous Q12H   Continuous Infusions: . sodium chloride 125 mL/hr at 07/15/16 1900    Principal Problem:   Acute renal failure [facility]) Active Problems:   Malignant neoplasm of urinary bladder (HCC)   Nonspecific abnormal electrocardiogram (ECG) (EKG)   Hyperkalemia   Elevated lactic acid level   Uremia   Acute bilateral obstructive uropathy  Anemia of chronic disease   Hypocalcemia   Bilateral hydronephrosis   Metastatic cancer ([place])    Time spent: 6 minutes    Barton Dubois  [facility] Pager [phone]. If 7PM-7AM, please contact night-coverage at [email or web address], password [facility] 07/16/2016, 9:27 AM  LOS: 4 days

## 2016-07-17 DIAGNOSIS — G893 Neoplasm related pain (acute) (chronic): Secondary | ICD-10-CM

## 2016-07-17 DIAGNOSIS — Z515 Encounter for palliative care: Secondary | ICD-10-CM

## 2016-07-17 LAB — BASIC METABOLIC PANEL
ANION GAP: 8 (ref 5–15)
BUN: 26 mg/dL — AB (ref 6–20)
CALCIUM: 8.8 mg/dL — AB (ref 8.9–10.3)
CO2: 22 mmol/L (ref 22–32)
Chloride: 110 mmol/L (ref 101–111)
Creatinine, Ser: 2.18 mg/dL — ABNORMAL HIGH (ref 0.61–1.24)
GFR calc Af Amer: 36 mL/min — ABNORMAL LOW (ref >60)
GFR, EST NON AFRICAN AMERICAN: 31 mL/min — AB (ref >60)
GLUCOSE: 118 mg/dL — AB (ref 65–99)
Potassium: 3.6 mmol/L (ref 3.5–5.1)
Sodium: 140 mmol/L (ref 135–145)

## 2016-07-17 LAB — GLUCOSE, CAPILLARY
GLUCOSE-CAPILLARY: 187 mg/dL — AB (ref 65–99)
GLUCOSE-CAPILLARY: 159 mg/dL — AB (ref 65–99)
GLUCOSE-CAPILLARY: 190 mg/dL — AB (ref 65–99)
GLUCOSE-CAPILLARY: 141 mg/dL — AB (ref 65–99)

## 2016-07-17 LAB — MAGNESIUM: Magnesium: 1.5 mg/dL — ABNORMAL LOW (ref 1.7–2.4)

## 2016-07-17 MED ORDER — HALOPERIDOL LACTATE 5 MG/ML IJ SOLN
1.0000 mg | Freq: Three times a day (TID) | INTRAMUSCULAR | Status: DC | PRN
Start: 2016-07-17 — End: 2016-07-19

## 2016-07-17 MED ORDER — QUETIAPINE FUMARATE 25 MG PO TABS: 25.0000 mg | ORAL_TABLET | Freq: Every day | ORAL | Status: DC

## 2016-07-17 MED ORDER — OXYCODONE HCL 5 MG PO TABS
10.0000 mg | ORAL_TABLET | ORAL | Status: DC | PRN
  Administered 2016-07-17 – 2016-07-19 (×8): 10 mg via ORAL
  Filled 2016-07-17 (×8): qty 2

## 2016-07-17 MED ORDER — OXYCODONE HCL ER 20 MG PO T12A
20.0000 mg | EXTENDED_RELEASE_TABLET | Freq: Two times a day (BID) | ORAL | Status: DC
  Administered 2016-07-17 – 2016-07-18 (×3): 20 mg via ORAL
  Filled 2016-07-17 (×3): qty 1

## 2016-07-17 MED ORDER — OXYCODONE HCL 5 MG PO TABS: 5.0000 mg | ORAL_TABLET | ORAL | Status: DC | PRN

## 2016-07-17 MED ORDER — OLANZAPINE 5 MG PO TABS
5.0000 mg | ORAL_TABLET | Freq: Every day | ORAL | Status: DC
  Administered 2016-07-17 – 2016-07-18 (×2): 5 mg via ORAL
  Filled 2016-07-17 (×3): qty 1

## 2016-07-17 NOTE — Consult Note (Signed)
Consultation Note Date: 07/17/2016   Patient Name: Alexander Wolf  DOB: 03-22-1956  MRN: [id number]  Age / Sex: 60 y.o., male  PCP: Shawnee Knapp, MD Referring Physician: Barton Dubois, MD  Reason for Consultation: Establishing goals of care and Pain control  HPI/Patient Profile: 60 y.o. male  with past medical history of bladder cancer, DM, CAD s/p CABG, HTN, and HLD  admitted on 07/12/2016 with abnormal labs and inability to urinate.  He was found to have rapidly progressive bladder cancer with bilateral hydronephrosis secondary to obstructive uropathy with uremia. He is status post percutaneous nephrostomy tube placement on 11/30.  He continues to have a in his left leg and lower abdomen. Palliative consulted for pain management as well as initiation of discussion of goals of care in light of his advanced cancer.  Clinical Assessment and Goals of Care: I met today with Alexander Wolf.  He reports that his family and his faith are the most important things to him.  He reports that the physicians have been doing a good job explaining things to him, but when asked what he has been told, simply states, "nothing good."  He would not elaborate further.  He continues to report pain in his left leg/buttock and lower pelvis.  Pain currently 8/10, dull and achy.  Worse when lying in certain positions and better when he shifts around or takes pain medications.  He states today that he thinks that the pain may be improving overall.  He relates pain starting with hemorrhoid pain a few weeks to months ago.  At home, he takes oxycodone 31m 1-2 tabs.  He reports that he often is in pain as he does not have enough pain medication to last him for the month.  I also called and spoke with his wife via phone.  She reports that she feels that he often has limited insight into the severity of his condition.  She also reports that she is  concerned that he have adequate pain control on discharge as this has been an issue for the past several months.  He will often tell providers that he is "fine" and then report that his pain is terrible to his wife.  SUMMARY OF RECOMMENDATIONS   - Pain: I agree that Alexander Wolf benefit from initiation of regimen of long and sort acting narcotics moving forward.  Over the past 24 hours, he has received 642mof oxycodone and 10m37mf IV dilaudid.  Overall, this is roughly equivalent to 100m91mal oxycodone daily.  My initial plan was to start oxycontin 30mg26mce daily with oxycodone as breakthrough medication.  He wanted to "start lower" and will therefore initiate OxyContin 20 mg twice daily with oxycodone 10 mg every 3 hours as needed for breakthrough medication. I also left hydromorphone 1 mg as a backup medication in case the oral route is ineffective. I believe we will need to continue to titrate up his long-acting medication.  -  Constipation:  Reports constipation as  OP that has resolved and now reports loose stools.  Discussed need to continue bowel regimen on d/c while taking narcotics. - Confusion: His wife also reports that he has been more "down and agitated" lately and asked about mood stabilizer.  Plan for transition from seroquel to Zyprexa to see if additional benefit from zyprexa as a mood stabilizer. - Goals of care: Briefly touched on this today with patient and his wife.  We discussed that the goals of any medical therapies is to add time and quality to his life.  He is very invested in plan to f/u with oncology and to pursue any therapies available.  I am not sure how much he understands about the severity of his disease.  His wife expressed this concern as well.   Will continue to follow-up and progress conversation as able this admission.  Code Status/Advance Care Planning:  Full code   Symptom Management:   As above  Palliative Prophylaxis:   Bowel Regimen and Frequent Pain  Assessment  Psycho-social/Spiritual:   Desire for further Chaplaincy support:no  Additional Recommendations: Caregiving  Support/Resources  Prognosis:   Unable to determine  Discharge Planning: To Be Determined      Primary Diagnoses: Present on Admission: . Acute renal failure ([place]) . Malignant neoplasm of urinary bladder ([place]) . Nonspecific abnormal electrocardiogram (ECG) (EKG) . Hyperkalemia . Elevated lactic acid level . Uremia . Acute bilateral obstructive uropathy . Anemia of chronic disease . Hypocalcemia   I have reviewed the medical record, interviewed the patient and family, and examined the patient. The following aspects are pertinent.  Past Medical History:  Diagnosis Date  . Bladder cancer ([place]) 2014   [facility] in [place], [place]; /  urologist in [place]-  dr Diona Fanti; lost to f/u, redeveloped problems in 7/17  . BPH (benign prostatic hypertrophy) with urinary retention   . Coronary artery disease    s/p CABG  (per pt hasn't seen cardiologist for several years)  . Foley catheter in place   . GERD (gastroesophageal reflux disease)   . Hyperlipidemia   . Hypertension   . Insomnia   . 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 Type 2 diabetes mellitus ([place])    Social History   Social History  . Marital status: Married    Spouse name: N/A  . Number of children: 3  . Years of education: N/A   Occupational History  . unemployed    Social History Main Topics  . Smoking status: Former Smoker    Packs/day: 1.00    Years: 44.00    Types: Cigarettes    Quit date: 03/22/2012  . Smokeless tobacco: Never Used  . Alcohol use No  . Drug use: No  . Sexual activity: Yes   Other Topics Concern  . None   Social History Narrative   Marital status: married x 12 years; second marriage.      Children: 3 children; 3 grandchildren; no gg      Lives: with wife      Employment:  Building control surveyor; Architect; 40 hours per week      Tobacco: quit in 3 years ago in 2013; smoked  45 years      Alcohol:  None      Drugs:  None      Exercise: works hard every day      Seatbelt:  100%; no texting while driving        Family History  Problem Relation Age of Onset  .  Varicose Veins Mother    Scheduled Meds: . atorvastatin  40 mg Oral Daily  . docusate sodium  50 mg Oral BID  . enoxaparin (LOVENOX) injection  40 mg Subcutaneous Q24H  . hydrocortisone   Rectal TID  . insulin aspart  0-9 Units Subcutaneous TID WC  . LORazepam  1 mg Intravenous Once  . mouth rinse  15 mL Mouth Rinse BID  . metoprolol  50 mg Oral BID  . OLANZapine  5 mg Oral QHS  . oxyCODONE  20 mg Oral Q12H  . pantoprazole  80 mg Oral Daily  . sodium chloride flush  3 mL Intravenous Q12H   Continuous Infusions: . sodium chloride 75 mL/hr at 07/17/16 1428   PRN Meds:.acetaminophen **OR** acetaminophen, haloperidol lactate, hydrALAZINE, HYDROmorphone (DILAUDID) injection, lidocaine-EPINEPHrine, methocarbamol, ondansetron **OR** ondansetron (ZOFRAN) IV, [DISCONTINUED] oxyCODONE-acetaminophen **AND** oxyCODONE, polyethylene glycol, sodium chloride flush Medications Prior to Admission:  Prior to Admission medications   Medication Sig Start Date End Date Taking? Authorizing Provider  amitriptyline (ELAVIL) 25 MG tablet Take 1 tablet (25 mg total) by mouth at bedtime. 06/13/16  Yes Shawnee Knapp, MD  atorvastatin (LIPITOR) 40 MG tablet Take 1 tablet (40 mg total) by mouth daily. 03/29/16  Yes Wardell Honour, MD  diazepam (VALIUM) 5 MG tablet Take 1 tab by mouth on an empty stomach 2 hrs prior to procedure. Repeat in 60 min if needed 06/28/16  Yes Shawnee Knapp, MD  docusate sodium (COLACE) 100 MG capsule Take 1 capsule (100 mg total) by mouth 2 (two) times daily as needed for mild constipation. Patient taking differently: Take 100 mg by mouth 2 (two) times daily.  04/07/16  Yes Shawnee Knapp, MD  lidocaine (XYLOCAINE) 5 % ointment Apply 1 application topically as needed. Patient taking differently: Apply 1  application topically as needed for mild pain.  06/29/16  Yes Shawnee Knapp, MD  lidocaine-prilocaine (EMLA) cream Apply to port before chemotherapy. 04/10/16  Yes Wyatt Portela, MD  metFORMIN (GLUCOPHAGE) 1000 MG tablet Take 1 tablet (1,000 mg total) by mouth 2 (two) times daily with a meal. 04/11/16  Yes Tereasa Coop, PA-C  metoprolol (LOPRESSOR) 50 MG tablet TAKE 1/2 TABLET BY MOUTH TWICE DAILY 06/13/16  Yes Shawnee Knapp, MD  Omega-3 Fatty Acids (FISH OIL) 300 MG CAPS Take 600 capsules by mouth 2 (two) times daily.    Yes Historical Provider, MD  omeprazole (PRILOSEC) 40 MG capsule TAKE 1 CAPSULE BY MOUTH EVERY DAY 03/29/16  Yes Wardell Honour, MD  oxyCODONE-acetaminophen (PERCOCET) 10-325 MG tablet Take 1-2 tablets by mouth every 4 (four) hours as needed for pain. 06/28/16  Yes Shawnee Knapp, MD  polyethylene glycol powder (GLYCOLAX/MIRALAX) powder Take 17 g by mouth 4 (four) times daily as needed. Patient taking differently: Take 17 g by mouth 4 (four) times daily as needed for mild constipation.  04/14/16  Yes Shawnee Knapp, MD  prochlorperazine (COMPAZINE) 10 MG tablet Take 1 tablet (10 mg total) by mouth every 6 (six) hours as needed for nausea or vomiting. 06/14/16  Yes Wyatt Portela, MD  zolpidem (AMBIEN) 10 MG tablet TAKE 1 TABLET BY MOUTH EVERY NIGHT AT BEDTIME AS NEEDED FOR SLEEP 06/05/16  Yes Shawnee Knapp, MD  glipiZIDE (GLUCOTROL) 5 MG tablet TAKE 1 TABLET BY MOUTH TWICE DAILY BEFORE A MEAL 07/15/16   Shawnee Knapp, MD   No Known Allergies Review of Systems  Constitutional: Positive for activity change, appetite change and  fatigue.  Gastrointestinal: Positive for abdominal distention, abdominal pain, constipation, diarrhea and rectal pain.  Musculoskeletal: Positive for back pain and neck pain.  Neurological: Positive for weakness.  Psychiatric/Behavioral: Positive for sleep disturbance.    Physical Exam  General: Alert, awake, in no acute distress.  HEENT: No bruits, no goiter, no JVD Heart:  Regular rate and rhythm. No murmur appreciated. Lungs: Good air movement, clear Abdomen: Soft, nontender, nondistended, positive bowel sounds.  Ext: No significant edema Skin: Warm and dry Neuro: Grossly intact, nonfocal.  Vital Signs: BP 122/73 (BP Location: Left Arm)   Pulse 79   Temp 98.2 F (36.8 C) (Oral)   Resp 18   Ht _0  (1.778 m)   Wt 99.6 kg (219 lb 9.3 oz)   SpO2 98%   BMI 31.51 kg/m  Pain Assessment: 0-10   Pain Score: 4    SpO2: SpO2: 98 % O2 Device:SpO2: 98 % O2 Flow Rate: .O2 Flow Rate (L/min): 2 L/min  IO: Intake/output summary:  Intake/Output Summary (Last 24 hours) at 07/17/16 1814 Last data filed at 07/17/16 1734  Gross per 24 hour  Intake              360 ml  Output             5100 ml  Net            -4740 ml    LBM: Last BM Date: 07/16/16 Baseline Weight: Weight: 93.7 kg (206 lb 9.1 oz) Most recent weight: Weight: 99.6 kg (219 lb 9.3 oz)     Palliative Assessment/Data:   Flowsheet Rows   Flowsheet Row Most Recent Value  Intake Tab  Referral Department  Hospitalist  Unit at Time of Referral  Med/Surg Unit  Palliative Care Primary Diagnosis  Cancer  Date Notified  07/17/16  Palliative Care Type  New Palliative care  Reason for referral  Pain, Clarify Goals of Care  Date of Admission  07/12/16  Date first seen by Palliative Care  07/17/16  # of days Palliative referral response time  0 Day(s)  # of days IP prior to Palliative referral  5  Clinical Assessment  Palliative Performance Scale Score  50%  Pain Max last 24 hours  9  Pain Min Last 24 hours  3  Psychosocial & Spiritual Assessment  Palliative Care Outcomes  Patient/Family meeting held?  Yes  Who was at the meeting?  Patient  Palliative Care Outcomes  Improved pain interventions      Time In: 1315 Time Out: 1430 Time Total: 75 Greater than 50%  of this time was spent counseling and coordinating care related to the above assessment and plan.  Signed by: Micheline Rough,  MD   Please contact Palliative Medicine Team phone at [phone] for questions and concerns.  For individual provider: See Shea Evans

## 2016-07-17 NOTE — Progress Notes (Signed)
[facility] PROGRESS NOTE  Alexander Wolf [id number] DOB: 1955-10-28 DOA: 07/12/2016 PCP: [name], MD  Interim summary and HPI 60 y.o. male with medical history significant of bladder cancer, DM, CAD s/p CABG, HTN, and HLD presenting with abnormal labs.   Patient with routine labs today, K+ and creatinine elevated.  Vomiting, inability to urinate.  Inability to urinate since Monday definitely, but wife is unsure if maybe longer.  Intermittent vomiting for the last week.  +confusion, waxing and waning for over a month now. Anorectal pain and anxiety reported as well.  Assessment/Plan: 1-acute renal failure: due to hydronephrosis and tumor burden obstructing outlet -patient status post bilateral PCNT by IR on 11/30; continue draining properly and with great output -Cr rapidly trending down (down to 2.18 now) -will continue IVF's and supportive care -no further interventions planned at this moment  -continue PRN antiemetics and analgesics  -if remains stable, most likely home in 1-2 days  2-Metabolic encephalopathy/confusion  -due to uremia most likely; also with concerns for hospital acquired delirium and medications -will continue holding bezo's, minimize IV narcotics and continue seroquel (last one just at bedtime now) -continue PRN haldol and supportive care/constant reorientation  -will monitor mentation closely; significantly improved today.  3-hyperkalemia and hypocalcemia -treated in ED and received calcium gluconate -will monitor and replete as needed  -continue telemetry monitoring   4-Malignant bladder neoplasm -will follow oncology and urology rec's -prognosis appears gray right now -patient has essentially failed chemotherapy -per Dr. Alen Blew very little to be offer on radiation or even salvage chemo -urology has seen and do not believe surgery is an option, at least not now. Plan is essentially for chemotherapy and radiation (Salvage treatment).   5-anemia of  chronic disease -Hgb 8.5 after transfusing 1 unit on 12/3 -will monitor Hgb trend  -no signs of acute bleeding  6-type 2 diabetes -will hold oral hypoglycemic agents and continue using SSI  7-chronic pain syndrome -associated with cancer -will continue home oxycodone -will also consult Palliative care for pain management assistance  8-lactic acidosis -due to ARF and dehydration -will continue IVF's -lactic acid WNL now  9-hypomagnesemia/hypokalemia -will monitor and replete as needed  -K 3.6 and Mg 1.5 today (12/5)  Code Status: Full Family Communication: wife at bedside  Disposition Plan: will keep in the hospital. Follow urology and oncology rec's. Palliative care consulted for Goals of Care and also pain management (as recommended by urology). Will track renal function for another 24 hours. If stable can go home in the next 24-48 hours. Non toxic and with good output (through PCNT).   Consultants:  Urology  Oncology  IR  Palliative Care  Procedures:  PCNT placement on 11/30  Antibiotics:  Received Ciprofloxacin prophylactically for PCNT placement  11/30  HPI/Subjective: Afebrile, no CP, no SOB. Patient reports some intermittent episodes of pain in his lower abd; also with lower back pain and left calf. Significant improvement in mentation and no presenting agitation.   Objective: Vitals:   07/16/16 2325 07/17/16 0535  BP: (!) 124/98 125/80  Pulse: 76 98  Resp: 16 18  Temp: 98.1 F (36.7 C) 98 F (36.7 C)    Intake/Output Summary (Last 24 hours) at 07/17/16 1401 Last data filed at 07/17/16 1247  Gross per 24 hour  Intake              420 ml  Output             6225 ml  Net            -  5805 ml   Filed Weights   07/13/16 0128 07/13/16 0701  Weight: 93.7 kg (206 lb 9.1 oz) 99.6 kg (219 lb 9.3 oz)    Exam:   General:  Afebrile, no CP, no SOB. Complaining of lower back and left thigh/left calf pain. Significant improvement in agitation and  confusion; currently AAOX3. Following commands properly and denying abd pain, nausea and vomiting.    Cardiovascular: S1 and S2, sinus tachycardic, no rubs or gallops  Respiratory: good air movement, no wheezing, no crackles, patient with good O2 sat on RA and not using accessory muscles   Abdomen: soft, NT, ND, positive BS  Skin: percutaneous nephrostomy tube in place bilaterally, good flow, clear urine seen, no blood appreciated.  Musculoskeletal: no edema or cyanosis; reports pain in his thighs and lower back.   Data Reviewed: Basic Metabolic Panel:  Recent Labs Lab 07/13/16 0327 07/14/16 0518 07/15/16 0512 07/16/16 0555 07/17/16 0245  NA 136 144 141 139 140  K 4.7 4.5 3.0* 3.1* 3.6  CL 99* 111 115* 108 110  CO2 19* 21* 16* 20* 22  GLUCOSE 132* 157* 121* 120* 118*  BUN 98* 51* 31* 28* 26*  CREATININE 14.63* 6.20* 2.75* 2.37* 2.18*  CALCIUM 8.2* 8.8* 7.4* 8.0* 8.8*  MG  --   --  0.9* 1.3* 1.5*   Liver Function Tests:  Recent Labs Lab 07/12/16 1132 07/12/16 1757  AST 24 24  ALT <6 7*  ALKPHOS 158* 111  BILITOT 0.51 0.8  PROT 8.2 7.5  ALBUMIN 3.0* 3.3*   CBC:  Recent Labs Lab 07/12/16 1132 07/12/16 1757 07/12/16 1806 07/13/16 0327 07/15/16 0512 07/16/16 0555  WBC 13.1* 9.5  --  9.8 11.3* 10.3  NEUTROABS 10.3* 7.7  --   --   --   --   HGB 9.9* 8.8* 8.8* 8.0* 7.3* 8.5*  HCT 29.3* 26.8* 26.0* 24.5* 22.3* 25.2*  MCV 84.9 85.4  --  83.6 88.5 87.2  PLT 344 291  --  265 210 252   Cardiac Enzymes:  Recent Labs Lab 07/13/16 0327 07/13/16 0913 07/13/16 1528  TROPONINI 0.07* 0.13* 0.15*   CBG:  Recent Labs Lab 07/16/16 1250 07/16/16 1653 07/16/16 2346 07/17/16 0732 07/17/16 1204  GLUCAP 244* 223* 123* 187* 159*    Recent Results (from the past 240 hour(s))  MRSA PCR Screening     Status: None   Collection Time: 07/12/16 11:59 PM  Result Value Ref Range Status   MRSA by PCR NEGATIVE NEGATIVE Final    Comment:        The GeneXpert MRSA Assay  (FDA approved for NASAL specimens only), is one component of a comprehensive MRSA colonization surveillance program. It is not intended to diagnose MRSA infection nor to guide or monitor treatment for MRSA infections.      Studies: No results found.  Scheduled Meds: . atorvastatin  40 mg Oral Daily  . docusate sodium  50 mg Oral BID  . enoxaparin (LOVENOX) injection  40 mg Subcutaneous Q24H  . hydrocortisone   Rectal TID  . insulin aspart  0-9 Units Subcutaneous TID WC  . LORazepam  1 mg Intravenous Once  . mouth rinse  15 mL Mouth Rinse BID  . metoprolol  50 mg Oral BID  . pantoprazole  80 mg Oral Daily  . [START ON 07/18/2016] QUEtiapine  25 mg Oral QHS  . sodium chloride flush  3 mL Intravenous Q12H   Continuous Infusions: . sodium chloride 100 mL/hr at 07/17/16 0256  Principal Problem:   Acute renal failure ([facility]) Active Problems:   Malignant neoplasm of urinary bladder ([facility])   Nonspecific abnormal electrocardiogram (ECG) (EKG)   Hyperkalemia   Elevated lactic acid level   Uremia   Acute bilateral obstructive uropathy   Anemia of chronic disease   Hypocalcemia   Bilateral hydronephrosis   Metastatic cancer ([place])    Time spent: 25 minutes    Barton Dubois  [facility] Pager [phone]. If 7PM-7AM, please contact night-coverage at [email or web address], password [facility] 07/17/2016, 2:01 PM  LOS: 5 days

## 2016-07-17 NOTE — Care Management Note (Signed)
Case Management Note  Patient Details  Name: Alexander Wolf MRN: [id number] Date of Birth: 1955/11/25  Subjective/Objective:  60 y/o m admitted w/Acute renal failure. From home.                  Action/Plan:d/c plan home.   Expected Discharge Date:   (unknown)               Expected Discharge Plan:  Home/Self Care  In-House Referral:     Discharge planning Services  CM Consult  Post Acute Care Choice:    Choice offered to:     DME Arranged:    DME Agency:     HH Arranged:    HH Agency:     Status of Service:  In process, will continue to follow  If discussed at Long Length of Stay Meetings, dates discussed:    Additional Comments:  Dessa Phi, RN 07/17/2016, 12:35 PM

## 2016-07-17 NOTE — Progress Notes (Addendum)
Inpatient Diabetes Program Recommendations  AACE/ADA: New Consensus Statement on Inpatient Glycemic Control (2015)  Target Ranges:  Prepandial:   less than 140 mg/dL      Peak postprandial:   less than 180 mg/dL (1-2 hours)      Critically ill patients:  140 - 180 mg/dL   Results for Alexander Wolf, Alexander Wolf (MRN [id number]) as of 07/17/2016 10:21  Ref. Range 07/16/2016 07:37 07/16/2016 12:50 07/16/2016 16:53 07/16/2016 23:46  Glucose-Capillary Latest Ref Range: 65 - 99 mg/dL 127 (H) 244 (H) 223 (H) 123 (H)    Home DM Meds: Glipizide 5 mg BID       Metformin 1000 mg BID  Current Insulin Orders: Novolog Sensitive Correction Scale/ SSI (0-9 units) TID AC       MD- Patient having occasional CBGs >200 mg/dl.  Home oral DM meds currently on hold.  Please consider increasing Novolog Correction Scale/ SSI to Moderate scale (0-15 units) TID AC + HS (currently ordered as Sensitive scale (0-9 units) TID AC)     --Will follow patient during hospitalization--  Wyn Quaker RN, MSN, CDE Diabetes Coordinator Inpatient Glycemic Control Team Team Pager: [phone] (8a-5p)

## 2016-07-18 ENCOUNTER — Telehealth: Payer: Self-pay

## 2016-07-18 DIAGNOSIS — N139 Obstructive and reflux uropathy, unspecified: Secondary | ICD-10-CM

## 2016-07-18 DIAGNOSIS — Z7189 Other specified counseling: Secondary | ICD-10-CM

## 2016-07-18 DIAGNOSIS — Z515 Encounter for palliative care: Secondary | ICD-10-CM

## 2016-07-18 DIAGNOSIS — N179 Acute kidney failure, unspecified: Principal | ICD-10-CM

## 2016-07-18 DIAGNOSIS — G893 Neoplasm related pain (acute) (chronic): Secondary | ICD-10-CM

## 2016-07-18 DIAGNOSIS — D638 Anemia in other chronic diseases classified elsewhere: Secondary | ICD-10-CM

## 2016-07-18 DIAGNOSIS — C679 Malignant neoplasm of bladder, unspecified: Secondary | ICD-10-CM

## 2016-07-18 DIAGNOSIS — N1330 Unspecified hydronephrosis: Secondary | ICD-10-CM

## 2016-07-18 LAB — BASIC METABOLIC PANEL
ANION GAP: 9 (ref 5–15)
BUN: 26 mg/dL — AB (ref 6–20)
CHLORIDE: 106 mmol/L (ref 101–111)
CO2: 24 mmol/L (ref 22–32)
Calcium: 8.7 mg/dL — ABNORMAL LOW (ref 8.9–10.3)
Creatinine, Ser: 1.94 mg/dL — ABNORMAL HIGH (ref 0.61–1.24)
GFR calc Af Amer: 42 mL/min — ABNORMAL LOW (ref >60)
GFR calc non Af Amer: 36 mL/min — ABNORMAL LOW (ref >60)
GLUCOSE: 152 mg/dL — AB (ref 65–99)
POTASSIUM: 3.6 mmol/L (ref 3.5–5.1)
Sodium: 139 mmol/L (ref 135–145)

## 2016-07-18 LAB — CBC
HEMATOCRIT: 28.6 % — AB (ref 39.0–52.0)
HEMOGLOBIN: 9.4 g/dL — AB (ref 13.0–17.0)
MCH: 28.9 pg (ref 26.0–34.0)
MCHC: 32.9 g/dL (ref 30.0–36.0)
MCV: 88 fL (ref 78.0–100.0)
Platelets: 316 10*3/uL (ref 150–400)
RBC: 3.25 MIL/uL — ABNORMAL LOW (ref 4.22–5.81)
RDW: 17.4 % — ABNORMAL HIGH (ref 11.5–15.5)
WBC: 11.9 10*3/uL — ABNORMAL HIGH (ref 4.0–10.5)

## 2016-07-18 LAB — GLUCOSE, CAPILLARY
GLUCOSE-CAPILLARY: 172 mg/dL — AB (ref 65–99)
Glucose-Capillary: 147 mg/dL — ABNORMAL HIGH (ref 65–99)
Glucose-Capillary: 146 mg/dL — ABNORMAL HIGH (ref 65–99)
Glucose-Capillary: 160 mg/dL — ABNORMAL HIGH (ref 65–99)

## 2016-07-18 LAB — MAGNESIUM: Magnesium: 1.3 mg/dL — ABNORMAL LOW (ref 1.7–2.4)

## 2016-07-18 MED ORDER — OXYCODONE HCL ER 20 MG PO T12A
40.0000 mg | EXTENDED_RELEASE_TABLET | Freq: Two times a day (BID) | ORAL | Status: DC
  Administered 2016-07-18 – 2016-07-19 (×2): 40 mg via ORAL
  Filled 2016-07-18 (×2): qty 2

## 2016-07-18 MED ORDER — HYDROMORPHONE HCL 1 MG/ML IJ SOLN
INTRAMUSCULAR | Status: AC
  Filled 2016-07-18: qty 1

## 2016-07-18 MED ORDER — HYDROMORPHONE HCL 1 MG/ML IJ SOLN
1.0000 mg | INTRAMUSCULAR | Status: DC | PRN
  Administered 2016-07-18 – 2016-07-19 (×5): 1 mg via INTRAVENOUS
  Filled 2016-07-18 (×4): qty 1

## 2016-07-18 MED ORDER — DULOXETINE HCL 30 MG PO CPEP
30.0000 mg | ORAL_CAPSULE | Freq: Every day | ORAL | Status: DC
  Administered 2016-07-18 – 2016-07-19 (×2): 30 mg via ORAL
  Filled 2016-07-18 (×2): qty 1

## 2016-07-18 NOTE — Progress Notes (Signed)
Daily Progress Note   Patient Name: Alexander Wolf       Date: 07/18/2016 DOB: [birth year]  Age: 60 y.o. MRN#: [id number] Attending Physician: Charlynne Cousins, MD Primary Care Physician: [name], MD Admit Date: 07/12/2016  Reason for Consultation/Follow-up: Establishing goals of care and Pain control  Subjective: I met with Alexander Wolf. We talked again about his pain. He reports that overall he thinks his pain is better controlled since increasing his narcotics. At the same time, however, he reports that he continues to have episodes of pain that remain uncontrolled.  Discussed increasing contacts as well as addition of another adjuvant (Cymbalta).  We also continue discussion regarding long-term goals. I still think he has limited insight into the severity of his condition, although this may just be a need for him to have time to emotionally process everything. He again stated that things "are good" but would not really engage in conversation further than this.  See details below.  Length of Stay: 6  Current Medications: Scheduled Meds:  . atorvastatin  40 mg Oral Daily  . docusate sodium  50 mg Oral BID  . DULoxetine  30 mg Oral Daily  . enoxaparin (LOVENOX) injection  40 mg Subcutaneous Q24H  . hydrocortisone   Rectal TID  . insulin aspart  0-9 Units Subcutaneous TID WC  . LORazepam  1 mg Intravenous Once  . mouth rinse  15 mL Mouth Rinse BID  . metoprolol  50 mg Oral BID  . OLANZapine  5 mg Oral QHS  . oxyCODONE  40 mg Oral Q12H  . pantoprazole  80 mg Oral Daily  . sodium chloride flush  3 mL Intravenous Q12H    Continuous Infusions: . sodium chloride 75 mL/hr at 07/18/16 0411    PRN Meds: acetaminophen **OR** acetaminophen, haloperidol lactate, hydrALAZINE, HYDROmorphone  (DILAUDID) injection, lidocaine-EPINEPHrine, methocarbamol, ondansetron **OR** ondansetron (ZOFRAN) IV, [DISCONTINUED] oxyCODONE-acetaminophen **AND** oxyCODONE, polyethylene glycol, sodium chloride flush  Physical Exam    General: Alert, awake, in no acute distress.  HEENT: No bruits, no goiter, no JVD Heart: Regular rate and rhythm. No murmur appreciated. Lungs: Good air movement, clear Abdomen: Soft, nontender, nondistended, positive bowel sounds.  Ext: No significant edema Skin: Warm and dry Neuro: Grossly intact, nonfocal.       Vital Signs: BP 132/74 (BP Location:  Left Arm)   Pulse 90   Temp 98.6 F (37 C) (Oral)   Resp 20   Ht '5\' 10"'  (1.778 m)   Wt 99.6 kg (219 lb 9.3 oz)   SpO2 98%   BMI 31.51 kg/m  SpO2: SpO2: 98 % O2 Device: O2 Device: Not Delivered O2 Flow Rate: O2 Flow Rate (L/min): 2 L/min  Intake/output summary:  Intake/Output Summary (Last 24 hours) at 07/18/16 1628 Last data filed at 07/18/16 1357  Gross per 24 hour  Intake          3735.83 ml  Output             4700 ml  Net          -964.17 ml   LBM: Last BM Date: 07/16/16 Baseline Weight: Weight: 93.7 kg (206 lb 9.1 oz) Most recent weight: Weight: 99.6 kg (219 lb 9.3 oz)       Palliative Assessment/Data:    Flowsheet Rows   Flowsheet Row Most Recent Value  Intake Tab  Referral Department  Hospitalist  Unit at Time of Referral  Med/Surg Unit  Palliative Care Primary Diagnosis  Cancer  Date Notified  07/17/16  Palliative Care Type  New Palliative care  Reason for referral  Pain, Clarify Goals of Care  Date of Admission  07/12/16  Date first seen by Palliative Care  07/17/16  # of days Palliative referral response time  0 Day(s)  # of days IP prior to Palliative referral  5  Clinical Assessment  Palliative Performance Scale Score  50%  Pain Max last 24 hours  9  Pain Min Last 24 hours  3  Psychosocial & Spiritual Assessment  Palliative Care Outcomes  Patient/Family meeting held?  Yes    Who was at the meeting?  Patient  Palliative Care Outcomes  Improved pain interventions      Patient Active Problem List   Diagnosis Date Noted  . Bilateral hydronephrosis   . Metastatic cancer ([place])   . Nonspecific abnormal electrocardiogram (ECG) (EKG) 07/13/2016  . Hyperkalemia 07/13/2016  . Elevated lactic acid level 07/13/2016  . Uremia 07/13/2016  . Acute bilateral obstructive uropathy 07/13/2016  . Anemia of chronic disease 07/13/2016  . Hypocalcemia 07/13/2016  . Acute renal failure ([place]) 07/12/2016  . Port catheter in place 06/05/2016  . Constipation 05/30/2016  . Malignant neoplasm of urinary bladder ([place]) 04/10/2016  . Nodular prostate with urinary obstruction 03/30/2016  . BPH (benign prostatic hypertrophy) with urinary retention 03/29/2016  . Coronary artery disease involving coronary bypass graft of native heart without angina pectoris 04/04/2014  . Essential hypertension 04/04/2014  . Hyperlipidemia LDL goal <70 04/04/2014  . Type 2 diabetes mellitus not at goal [facility]) 04/04/2014  . Hx of bladder cancer 04/04/2014    Palliative Care Assessment & Plan   Patient Profile: 60 y.o. male  with past medical history of bladder cancer, DM, CAD s/p CABG, HTN, and HLD  admitted on 07/12/2016 with abnormal labs and inability to urinate.  He was found to have rapidly progressive bladder cancer with bilateral hydronephrosis secondary to obstructive uropathy with uremia. He is status post percutaneous nephrostomy tube placement on 11/30.  He continues to have a in his left leg and lower abdomen. Palliative consulted for pain management as well as initiation of discussion of goals of care in light of his advanced cancer.  Assessment: Stage IV bladder cancer  Neoplasm related pain  Recommendations/Plan:  Pain: He reports that his pain overall seems better  controlled on current medication but he still has episodic pain that escalates quickly it is not relieved until he gets  further medication. It is also relieved by positional changes. He has had approximately the equivalent of 140 mg of oral oxycodone between OxyContin, oxycodone, and Dilaudid.  90 mg of this was in the form of oral oxycodone and OxyContin. Yesterday he also used equivalent of over 100 mg of oral oxycodone.  We'll plan to increase OxyContin to 40 mg twice daily. Also continue oxycodone as needed with backup Dilaudid in case oral route is ineffective. I also discussed with him initiation of Cymbalta as this may be beneficial both in regard to his mood as well as musculoskeletal pain. Started dose of 30 mg and would continue this for 1 week. At that point in time I would increase to 60 mg daily. I called and left a message with his outpatient primary care provider in order to discuss options for pain management on discharge.   Goals of care: We talked again about the fact that he has a noncurable illness and that there is a high chance that he may not benefit from pursuing disease modifying therapy much longer. He wants to follow-up with oncology as an outpatient to discuss with them. Discussed with him that he and his wife will need to talk about what is most important to him moving forward and encouraged him to continue treatments that are likely to add time and quality to his life while working with his oncologic team to determine how to best focus on his goals of feeling as well as possible and spending time with family.  I tried to call his wife again today and left a message for her. In my discussion with her yesterday, I feel as though she is much more aware of where he is at his disease process than Mr. Glockner is himself.  Goals of Care and Additional Recommendations:  Limitations on Scope of Treatment: Full Scope Treatment  Code Status:    Code Status Orders        Start     Ordered   07/12/16 2356  Full code  Continuous     07/12/16 2355    Code Status History    Date Active Date Inactive  Code Status Order ID Comments User Context   03/29/2016 12:44 PM 03/30/2016  1:42 PM Full Code [id number]  Franchot Gallo, MD Inpatient       Prognosis:   Unable to determine, however, If he were to forego further disease modifying therapy, his expected prognosis would be less than 6 months and he should qualify for hospice support if so desired any point in the future  Discharge Planning:  To Be Determined (PT consult pending)  Care plan was discussed with patient  Thank you for allowing the Palliative Medicine Team to assist in the care of this patient.   Time In: 1300 Time Out: 1340 Total Time 40 Prolonged Time Billed No      Greater than 50%  of this time was spent counseling and coordinating care related to the above assessment and plan.  Micheline Rough, MD  Please contact Palliative Medicine Team phone at [phone] for questions and concerns.

## 2016-07-18 NOTE — Telephone Encounter (Signed)
[name] - Dr. Micheline Rough called in regards to the patient.  His cancer is back and he has increased his pain medication.  He would like to talk to you about this and future pain management.  Please call him at either his office [phone] or his cell phone [phone].

## 2016-07-18 NOTE — Progress Notes (Signed)
[facility] PROGRESS NOTE    Progress Note  ABBIE RUSCHAK  [id number] DOB: [birth year] DOA: 07/12/2016 PCP: [name], MD     Brief Narrative:   Alexander Wolf is an 60 y.o. male past medical history of bladder cancer, diabetes mellitus present with abnormal labs to the ED, with potassium and creatinine slightly elevated, with nausea vomiting and inability to the 2 urinate.  Assessment/Plan:   Acute renal failure due to  Acute bilateral obstructive uropathy [facility]): Baseline creatinine of 1.3 or less, creatinine on admission greater than 18. Patient is status post percutaneous ostomy tube by IR on 07/12/2016. Creatinine has rapidly improved. Continue IV fluid hydration for 24 days continue check basic metabolic panel daily. Physical therapy evaluation pending  Metabolic encephalopathy/confusion: Likely due to uremia, with concern for acute confusional state. Holding benzodiazepines, continue Seroquel at bedtime. Haldol when necessary.  Hyperkalemia/hypocalcemia: Successfully treated in the ED with IV calcium gluconate and Kayexalate. With no runs on telemetry.  Malignant bladder neoplasm high-grade urothelial carcinoma: We'll continue to follow as an outpatient with oncology and urology. 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 Pelvis care team following, the patient has poor prognoses.  Anemia of chronic disease: Status post 1 unit of packed red blood cells on 07/15/2013. No signs of acute bleeding will continue to monitor hemoglobin.  Chronic pain syndrome: Likely associated with cancer Palliative care managing narcotics.  Metabolic acidosis: Likely due to acute renal failure now resolved.  Hypomagnesemia/hypokalemia: Currently stable.     DVT prophylaxis: lovenox Family Communication:Wife Disposition Plan/Barrier to  D/C: home in am Code Status:     Code Status Orders        Start     Ordered   07/12/16 2356  Full code  Continuous     07/12/16 2355    Code Status History    Date Active Date Inactive Code Status Order ID Comments User Context   03/29/2016 12:44 PM 03/30/2016  1:42 PM Full Code [id number]  Franchot Gallo, MD Inpatient        IV Access:    Peripheral IV   Procedures and diagnostic studies:   No results found.   Medical Consultants:    None.  Anti-Infectives:   None  Subjective:    Alexander Wolf no complains  Objective:    Vitals:   07/17/16 0535 07/17/16 1428 07/17/16 2218 07/18/16 0611  BP: 125/80 122/73 (!) 144/67 131/68  Pulse: 98 79 92 (!) 103  Resp: 18 18 18 20   Temp: 98 F (36.7 C) 98.2 F (36.8 C) 99.4 F (37.4 C) 97.7 F (36.5 C)  TempSrc: Oral Oral Oral Oral  SpO2: 100% 98% 97% 96%  Weight:      Height:        Intake/Output Summary (Last 24 hours) at 07/18/16 0841 Last data filed at 07/18/16 0806  Gross per 24 hour  Intake          3510.83 ml  Output             4775 ml  Net         -1264.17 ml   Filed Weights   07/13/16 0128 07/13/16 0701  Weight: 93.7 kg (206 lb 9.1 oz) 99.6 kg (219 lb 9.3 oz)    Exam: General exam: In no acute distress. Respiratory system: Good air movement and clear to  auscultation. Cardiovascular system: S1 & S2 heard, RRR. No JVD. Gastrointestinal system: Abdomen is nondistended, soft and nontender.  Central nervous system: Alert and oriented. No focal neurological deficits. Extremities: No pedal edema. Skin: No rashes, lesions or ulcers Psychiatry: Judgement and insight appear normal. Mood & affect appropriate.    Data Reviewed:    Labs: Basic Metabolic Panel:  Recent Labs Lab 07/14/16 0518 07/15/16 0512 07/16/16 0555 07/17/16 0245 07/18/16 0418  NA 144 141 139 140 139  K 4.5 3.0* 3.1* 3.6 3.6  CL 111 115* 108 110 106  CO2 21* 16* 20* 22 24  GLUCOSE 157* 121* 120* 118* 152*  BUN  51* 31* 28* 26* 26*  CREATININE 6.20* 2.75* 2.37* 2.18* 1.94*  CALCIUM 8.8* 7.4* 8.0* 8.8* 8.7*  MG  --  0.9* 1.3* 1.5*  --    GFR Estimated Creatinine Clearance: 47.9 mL/min (by C-G formula based on SCr of 1.94 mg/dL (H)). Liver Function Tests:  Recent Labs Lab 07/12/16 1132 07/12/16 1757  AST 24 24  ALT <6 7*  ALKPHOS 158* 111  BILITOT 0.51 0.8  PROT 8.2 7.5  ALBUMIN 3.0* 3.3*   No results for input(s): LIPASE, AMYLASE in the last 168 hours. No results for input(s): AMMONIA in the last 168 hours. Coagulation profile  Recent Labs Lab 07/12/16 1757  INR 1.19    CBC:  Recent Labs Lab 07/12/16 1132  07/12/16 1757 07/12/16 1806 07/13/16 0327 07/15/16 0512 07/16/16 0555 07/18/16 0418  WBC 13.1*  --  9.5  --  9.8 11.3* 10.3 11.9*  NEUTROABS 10.3*  --  7.7  --   --   --   --   --   HGB 9.9*  < > 8.8* 8.8* 8.0* 7.3* 8.5* 9.4*  HCT 29.3*  < > 26.8* 26.0* 24.5* 22.3* 25.2* 28.6*  MCV 84.9  --  85.4  --  83.6 88.5 87.2 88.0  PLT 344  --  291  --  265 210 252 316  < > = values in this interval not displayed. Cardiac Enzymes:  Recent Labs Lab 07/13/16 0327 07/13/16 0913 07/13/16 1528  TROPONINI 0.07* 0.13* 0.15*   BNP (last 3 results) No results for input(s): PROBNP in the last 8760 hours. CBG:  Recent Labs Lab 07/17/16 0732 07/17/16 1204 07/17/16 1646 07/17/16 2222 07/18/16 0720  GLUCAP 187* 159* 190* 141* 147*   D-Dimer: No results for input(s): DDIMER in the last 72 hours. Hgb A1c: No results for input(s): HGBA1C in the last 72 hours. Lipid Profile: No results for input(s): CHOL, HDL, LDLCALC, TRIG, CHOLHDL, LDLDIRECT in the last 72 hours. Thyroid function studies: No results for input(s): TSH, T4TOTAL, T3FREE, THYROIDAB in the last 72 hours.  Invalid input(s): FREET3 Anemia work up: No results for input(s): VITAMINB12, FOLATE, FERRITIN, TIBC, IRON, RETICCTPCT in the last 72 hours. Sepsis Labs:  Recent Labs Lab 07/12/16 1807 07/13/16 0327  07/13/16 0615 07/14/16 0518 07/15/16 0512 07/16/16 0555 07/18/16 0418  WBC  --  9.8  --   --  11.3* 10.3 11.9*  LATICACIDVEN 4.13* 2.9* 3.3* 1.3  --   --   --    Microbiology Recent Results (from the past 240 hour(s))  MRSA PCR Screening     Status: None   Collection Time: 07/12/16 11:59 PM  Result Value Ref Range Status   MRSA by PCR NEGATIVE NEGATIVE Final    Comment:        The GeneXpert MRSA Assay (FDA approved for NASAL specimens only), is  one component of a comprehensive MRSA colonization surveillance program. It is not intended to diagnose MRSA infection nor to guide or monitor treatment for MRSA infections.      Medications:   . atorvastatin  40 mg Oral Daily  . docusate sodium  50 mg Oral BID  . enoxaparin (LOVENOX) injection  40 mg Subcutaneous Q24H  . hydrocortisone   Rectal TID  . insulin aspart  0-9 Units Subcutaneous TID WC  . LORazepam  1 mg Intravenous Once  . mouth rinse  15 mL Mouth Rinse BID  . metoprolol  50 mg Oral BID  . OLANZapine  5 mg Oral QHS  . oxyCODONE  20 mg Oral Q12H  . pantoprazole  80 mg Oral Daily  . sodium chloride flush  3 mL Intravenous Q12H   Continuous Infusions: . sodium chloride 75 mL/hr at 07/18/16 0411    Time spent: 25 min   LOS: 6 days   Charlynne Cousins  [facility] Pager [phone]  *Please refer to [email or web address], password [email or web address] to get updated schedule on who will round on this patient, as hospitalists switch teams weekly. If 7PM-7AM, please contact night-coverage at [email or web address], password [email or web address] for any overnight needs.  07/18/2016, 8:41 AM

## 2016-07-18 NOTE — Progress Notes (Signed)
Subjective:  1 - Stage 4 Bladder Cancer - H/o high grade cancer originally managed by Dr. [place] Crane in [facility] s/p TURBT x2 around 2014 but patient did not follow up. Now with T4 disease by TURBT/TURP 03/2016 (signifiant prostatic stromal invovlment). CT at that time clinically localized but significant soft tissue tranding around bladder neck prostate consistent with T3/T4 disease, no obvious pelvic adenopathy. Completed several cycles of neo-adjuvant gem-cis under care of Dr. Alen Blew and was planning on curative intent cystoprostatectomy  (surgery was scheduled) but patient and family decided to switch providers again to [facility].  Recent Course: 07/2016 - CT with rapidly progressive locally advanced disease, new 11cm perirectal mass with rectal compression, 8cm bladder-prostate mass with malignant ureteral obstruction. New pelvic adenopathy. New sacral mass.    After consideration of options, he desires continued aggressive course with consideratio of 2nd line chemo-XRT.   2 - Bilateral Non-Complex renal Cysts - Right upper x2, Left upper x 1 <2cm cortical renal cysts w/o enhancement or mass effect by CT 2017.   3 - Nephrolithiasis - punctate right renal stones by CT 2017. No prior colic.   4 - Prostate Screening - PSA 0.71 2017.  5 - Acute Renal Failure / Bilateral Malignant Ureteral Obstruction  - Cr >18 with mild hyperkalemia and new bilateral malignant obstruction by intake labs. Bilateral neph tubes placed with rapid improvement in GFR but still compromised with Cr presently 1.9's and improving.   6- Cancer Related Pain - pt with severe pelvic / lower extremity pain. On PRN's with oxycodone at present. Pain management team now following with goals of determining regimen suitable for home.   Today "Alexander Wolf" is stable. Cr continues to improve. He continues to express desire for aggressive oncologic management stating "things are getting better, I can feel it".    Objective: Vital signs in last 24 hours: Temp:  [97.7 F (36.5 C)-99.4 F (37.4 C)] 98.6 F (37 C) (12/06 1405) Pulse Rate:  [90-103] 90 (12/06 1405) Resp:  [18-20] 20 (12/06 1405) BP: (131-144)/(67-74) 132/74 (12/06 1405) SpO2:  [96 %-98 %] 98 % (12/06 1405) Last BM Date: 07/16/16  Intake/Output from previous day: 12/05 0701 - 12/06 0700 In: 3510.8 [P.O.:600; I.V.:2910.8] Out: 4450 [Urine:4450] Intake/Output this shift: Total I/O In: 585 [P.O.:360; I.V.:225] Out: 2400 [Urine:2400]  General appearance: alert, cooperative and appears stated age Eyes: negative, wearing glasses Nose: Nares normal. Septum midline. Mucosa normal. No drainage or sinus tenderness. Throat: lips, mucosa, and tongue normal; teeth and gums normal Neck: supple, symmetrical, trachea midline Back: symmetric, no curvature. ROM normal. No CVA tenderness., bilateral neph tubes in situ with clear input.  Resp: non-labored on room air at present.  Cardio: Nl rate GI: soft, non-tender; bowel sounds normal; no masses,  no organomegaly and palpable lower abdominal mass Rt > LT, stable.  Male genitalia: normal Extremities: extremities normal, atraumatic, no cyanosis or edema Pulses: 2+ and symmetric Neurologic: Grossly normal  Lab Results:   Recent Labs  07/16/16 0555 07/18/16 0418  WBC 10.3 11.9*  HGB 8.5* 9.4*  HCT 25.2* 28.6*  PLT 252 316   BMET  Recent Labs  07/17/16 0245 07/18/16 0418  NA 140 139  K 3.6 3.6  CL 110 106  CO2 22 24  GLUCOSE 118* 152*  BUN 26* 26*  CREATININE 2.18* 1.94*  CALCIUM 8.8* 8.7*   PT/INR No results for input(s): LABPROT, INR in the last 72 hours. ABG No results for input(s): PHART, HCO3 in the last  72 hours.  Invalid input(s): PCO2, PO2  Studies/Results: No results found.  Anti-infectives: Anti-infectives    Start     Dose/Rate Route Frequency Ordered Stop   07/12/16 2230  ciprofloxacin (CIPRO) IVPB 400 mg     400 mg 200 mL/hr over 60 Minutes  Intravenous  Once 07/12/16 2229 07/13/16 0023      Assessment/Plan:  1 - Stage 4 Bladder Cancer -  Rapid progression despite chemotherapy. Prognosis grim and no role for surgery at this point. He is interested in possible 2nd line chemo / XRT. He is to be presented at [facility] cancer conference later this week.    Unfortunately he appears to have little insight into prognosis and I feel unrealistic goals. Natural history of burden of disease reiterated.    2 - Bilateral Non-Complex renal Cysts - low risk and stable, observe.   3 - Nephrolithiasis - low risk and stable, observe.   4 - Prostate Screening - no indication for further PSA testing  5 - Acute Renal Failure / Bilateral Malignant Ureteral Obstruction  - improving with bilateral neph tubes which will need to continue, likely life-long.   6 - Cancer Related Pain - appreciate pain management team's input.   Will follow, please call me directly with questions anytime.  [facility], Parag Dorton 07/18/2016

## 2016-07-18 NOTE — Progress Notes (Signed)
Patient ID: Alexander Wolf, male   DOB: February 03, 1956, 60 y.o.   MRN: [id number]    Referring Physician(s): Dr. Karmen Bongo  Supervising Physician: Daryll Brod  Patient Status: [facility] - In-pt  Chief Complaint: Bilateral hydronephrosis  Subjective: Patient c/o left leg pain that is very painful.  This is not new. He denies any back pain or issues with his PCNs  Allergies: Patient has no known allergies.  Medications: Prior to Admission medications   Medication Sig Start Date End Date Taking? Authorizing Provider  amitriptyline (ELAVIL) 25 MG tablet Take 1 tablet (25 mg total) by mouth at bedtime. 06/13/16  Yes Shawnee Knapp, MD  atorvastatin (LIPITOR) 40 MG tablet Take 1 tablet (40 mg total) by mouth daily. 03/29/16  Yes Wardell Honour, MD  diazepam (VALIUM) 5 MG tablet Take 1 tab by mouth on an empty stomach 2 hrs prior to procedure. Repeat in 60 min if needed 06/28/16  Yes Shawnee Knapp, MD  docusate sodium (COLACE) 100 MG capsule Take 1 capsule (100 mg total) by mouth 2 (two) times daily as needed for mild constipation. Patient taking differently: Take 100 mg by mouth 2 (two) times daily.  04/07/16  Yes Shawnee Knapp, MD  lidocaine (XYLOCAINE) 5 % ointment Apply 1 application topically as needed. Patient taking differently: Apply 1 application topically as needed for mild pain.  06/29/16  Yes Shawnee Knapp, MD  lidocaine-prilocaine (EMLA) cream Apply to port before chemotherapy. 04/10/16  Yes Wyatt Portela, MD  metFORMIN (GLUCOPHAGE) 1000 MG tablet Take 1 tablet (1,000 mg total) by mouth 2 (two) times daily with a meal. 04/11/16  Yes Tereasa Coop, PA-C  metoprolol (LOPRESSOR) 50 MG tablet TAKE 1/2 TABLET BY MOUTH TWICE DAILY 06/13/16  Yes Shawnee Knapp, MD  Omega-3 Fatty Acids (FISH OIL) 300 MG CAPS Take 600 capsules by mouth 2 (two) times daily.    Yes Historical Provider, MD  omeprazole (PRILOSEC) 40 MG capsule TAKE 1 CAPSULE BY MOUTH EVERY DAY 03/29/16  Yes Wardell Honour, MD    oxyCODONE-acetaminophen (PERCOCET) 10-325 MG tablet Take 1-2 tablets by mouth every 4 (four) hours as needed for pain. 06/28/16  Yes Shawnee Knapp, MD  polyethylene glycol powder (GLYCOLAX/MIRALAX) powder Take 17 g by mouth 4 (four) times daily as needed. Patient taking differently: Take 17 g by mouth 4 (four) times daily as needed for mild constipation.  04/14/16  Yes Shawnee Knapp, MD  prochlorperazine (COMPAZINE) 10 MG tablet Take 1 tablet (10 mg total) by mouth every 6 (six) hours as needed for nausea or vomiting. 06/14/16  Yes Wyatt Portela, MD  zolpidem (AMBIEN) 10 MG tablet TAKE 1 TABLET BY MOUTH EVERY NIGHT AT BEDTIME AS NEEDED FOR SLEEP 06/05/16  Yes Shawnee Knapp, MD  glipiZIDE (GLUCOTROL) 5 MG tablet TAKE 1 TABLET BY MOUTH TWICE DAILY BEFORE A MEAL 07/15/16   Shawnee Knapp, MD    Vital Signs: BP 131/68 (BP Location: Left Arm)   Pulse (!) 103   Temp 97.7 F (36.5 C) (Oral)   Resp 20   Ht 5\' 10"  (1.778 m)   Wt 219 lb 9.3 oz (99.6 kg)   SpO2 96%   BMI 31.51 kg/m   Physical Exam: Abd: bilateral PCN sites are c/d/i.  Left PCN with some blood tinge to the urine, but not significant.  Right PCN mostly clear yellow urine. Right with 2275cc and left with 2175cc.  Imaging: No results found.  Labs:  CBC:  Recent Labs  07/13/16 0327 07/15/16 0512 07/16/16 0555 07/18/16 0418  WBC 9.8 11.3* 10.3 11.9*  HGB 8.0* 7.3* 8.5* 9.4*  HCT 24.5* 22.3* 25.2* 28.6*  PLT 265 210 252 316    COAGS:  Recent Labs  04/19/16 1245 07/12/16 1757  INR 0.98 1.19    BMP:  Recent Labs  07/15/16 0512 07/16/16 0555 07/17/16 0245 07/18/16 0418  NA 141 139 140 139  K 3.0* 3.1* 3.6 3.6  CL 115* 108 110 106  CO2 16* 20* 22 24  GLUCOSE 121* 120* 118* 152*  BUN 31* 28* 26* 26*  CALCIUM 7.4* 8.0* 8.8* 8.7*  CREATININE 2.75* 2.37* 2.18* 1.94*  GFRNONAA 24* 28* 31* 36*  GFRAA 27* 33* 36* 42*    LIVER FUNCTION TESTS:  Recent Labs  06/05/16 0800 06/12/16 1218 07/12/16 1132 07/12/16 1757   BILITOT 0.23 0.36 0.51 0.8  AST 24 20 24 24   ALT 21 17 <6 7*  ALKPHOS 138 148 158* 111  PROT 7.7 8.3 8.2 7.5  ALBUMIN 3.0* 3.3* 3.0* 3.3*    Assessment and Plan: 1. Bilateral hydronephrosis secondary to stage IV bladder cancer, s/p B PCN placements on 11/30 - [place] -cont PCNs in place.  Urology suspects these will likely be life long.   -he will require q 6 week exchanges. -both PCNs are stable.  We will sign off.  Please call in the interim if you have further questions. -further plans per urology and medicine.  Electronically Signed: Henreitta Cea 07/18/2016, 10:10 AM   I spent a total of 15 Minutes at the the patient's bedside AND on the patient's hospital floor or unit, greater than 50% of which was counseling/coordinating care for bilateral hydronephrosis

## 2016-07-18 NOTE — Progress Notes (Signed)
I agree with the assessment from the previous nurse. 

## 2016-07-19 ENCOUNTER — Telehealth: Payer: Self-pay | Admitting: *Deleted

## 2016-07-19 DIAGNOSIS — K5903 Drug induced constipation: Secondary | ICD-10-CM

## 2016-07-19 DIAGNOSIS — N19 Unspecified kidney failure: Secondary | ICD-10-CM

## 2016-07-19 DIAGNOSIS — C799 Secondary malignant neoplasm of unspecified site: Secondary | ICD-10-CM

## 2016-07-19 DIAGNOSIS — E875 Hyperkalemia: Secondary | ICD-10-CM

## 2016-07-19 DIAGNOSIS — T402X5A Adverse effect of other opioids, initial encounter: Secondary | ICD-10-CM

## 2016-07-19 DIAGNOSIS — R7989 Other specified abnormal findings of blood chemistry: Secondary | ICD-10-CM

## 2016-07-19 LAB — BASIC METABOLIC PANEL
ANION GAP: 10 (ref 5–15)
BUN: 22 mg/dL — AB (ref 6–20)
CALCIUM: 8.8 mg/dL — AB (ref 8.9–10.3)
CO2: 24 mmol/L (ref 22–32)
CREATININE: 1.79 mg/dL — AB (ref 0.61–1.24)
Chloride: 104 mmol/L (ref 101–111)
GFR calc Af Amer: 46 mL/min — ABNORMAL LOW (ref >60)
GFR, EST NON AFRICAN AMERICAN: 39 mL/min — AB (ref >60)
GLUCOSE: 142 mg/dL — AB (ref 65–99)
Potassium: 3.2 mmol/L — ABNORMAL LOW (ref 3.5–5.1)
Sodium: 138 mmol/L (ref 135–145)

## 2016-07-19 LAB — GLUCOSE, CAPILLARY
Glucose-Capillary: 135 mg/dL — ABNORMAL HIGH (ref 65–99)
Glucose-Capillary: 141 mg/dL — ABNORMAL HIGH (ref 65–99)

## 2016-07-19 MED ORDER — OXYCODONE HCL ER 40 MG PO T12A: 40.0000 mg | EXTENDED_RELEASE_TABLET | Freq: Two times a day (BID) | ORAL | 0 refills | Status: DC

## 2016-07-19 MED ORDER — DULOXETINE HCL 30 MG PO CPEP: 30.0000 mg | ORAL_CAPSULE | Freq: Every day | ORAL | 0 refills | Status: DC

## 2016-07-19 MED ORDER — POLYETHYLENE GLYCOL 3350 17 G PO PACK: 17.0000 g | PACK | Freq: Two times a day (BID) | ORAL | Status: DC

## 2016-07-19 MED ORDER — OXYMORPHONE HCL ER 20 MG PO T12A: 20.0000 mg | EXTENDED_RELEASE_TABLET | Freq: Two times a day (BID) | ORAL | 0 refills | Status: DC

## 2016-07-19 MED ORDER — FENTANYL 75 MCG/HR TD PT72
75.0000 ug | MEDICATED_PATCH | TRANSDERMAL | Status: DC
  Administered 2016-07-19: 75 ug via TRANSDERMAL
  Filled 2016-07-19: qty 1

## 2016-07-19 MED ORDER — HEPARIN SOD (PORK) LOCK FLUSH 100 UNIT/ML IV SOLN
500.0000 [IU] | INTRAVENOUS | Status: AC | PRN
Start: 2016-07-19 — End: 2016-07-19
  Administered 2016-07-19: 500 [IU]

## 2016-07-19 MED ORDER — FENTANYL 75 MCG/HR TD PT72: 75.0000 ug | MEDICATED_PATCH | TRANSDERMAL | 0 refills | Status: DC

## 2016-07-19 MED ORDER — OXYCODONE HCL 15 MG PO TABS: 15.0000 mg | ORAL_TABLET | ORAL | 0 refills | Status: DC | PRN

## 2016-07-19 MED ORDER — SENNA 8.6 MG PO TABS
2.0000 | ORAL_TABLET | Freq: Two times a day (BID) | ORAL | Status: DC
  Administered 2016-07-19: 17.2 mg via ORAL
  Filled 2016-07-19: qty 2

## 2016-07-19 MED ORDER — OXYCODONE HCL 5 MG PO TABS
15.0000 mg | ORAL_TABLET | ORAL | Status: DC | PRN
  Administered 2016-07-19 (×2): 15 mg via ORAL
  Filled 2016-07-19 (×2): qty 3

## 2016-07-19 MED FILL — oxyCODONE HCL 15 MG TABS: 15 | 3 days supply | Qty: 30 | Fill #0

## 2016-07-19 MED FILL — DULoxetine HCL 30 MG CPEP: 30 | 30 days supply | Qty: 30 | Fill #0

## 2016-07-19 NOTE — Progress Notes (Signed)
Pt's wife, Eustaquio Maize, has concerns about home pain management regimen.  Beth expressed concerns regarding the discharge plan for the patient and that she had not been involved in the planning process. Questions for the MD that the wife had placed on the "white board" in the patient's room were addressed during morning rounds with the health care team.RN reviewed dc meds and follow up appts that are to take place after discharge.  .  Dr. Aileen Fass notified of wife's concerns.  Stacey Drain

## 2016-07-19 NOTE — Evaluation (Signed)
Physical Therapy One Time Evaluation Patient Details Name: Alexander Wolf MRN: [id number] DOB: Aug 03, 1956 Today's Date: 07/19/2016   History of Present Illness  60 y.o. male past medical history of bladder cancer, diabetes mellitus, CABG, HTN, CAD and admitted for acute renal failure due to acute bilateral obstructive uropathy   Clinical Impression  Patient evaluated by Physical Therapy with no further acute PT needs identified. All education has been completed and the patient has no further questions.  Pt ambulated in hallway and presents with antalgic gait pattern however reports no increased pain.  Pt may benefit from further PT while pursuing treatments if agreeable however no immediate PT needs upon d/c.  Pt agreeable to use RW at home for safety. Pt also agreeable to ambulate with nursing staff during acute stay. PT is signing off. Thank you for this referral.     Follow Up Recommendations No PT follow up    Equipment Recommendations  None recommended by PT    Recommendations for Other Services       Precautions / Restrictions Precautions Precautions: None Precaution Comments: bil posterior PCN      Mobility  Bed Mobility Overal bed mobility: Modified Independent                Transfers Overall transfer level: Modified independent                  Ambulation/Gait Ambulation/Gait assistance: Supervision;Min guard Ambulation Distance (Feet): 400 Feet Assistive device: None Gait Pattern/deviations: Step-through pattern;Antalgic;Decreased stance time - right     General Gait Details: antalgic gait pattern however pt reports no increased pain or weakness on one side vs other, pushed IV pole and remained near handrail, agreeable to use RW while in acute and upon d/c for safety  Stairs            Wheelchair Mobility    Modified Rankin (Stroke Patients Only)       Balance                                             Pertinent  Vitals/Pain Pain Assessment:  (reports chronic pain, not related, states tolerable for mobility)    Home Living Family/patient expects to be discharged to:: Private residence Living Arrangements: Spouse/significant other   Type of Home: House       Home Layout: Two level Home Equipment: Environmental consultant - 2 wheels      Prior Function Level of Independence: Independent               Hand Dominance        Extremity/Trunk Assessment               Lower Extremity Assessment: Generalized weakness         Communication   Communication: No difficulties  Cognition Arousal/Alertness: Awake/alert Behavior During Therapy: WFL for tasks assessed/performed Overall Cognitive Status: Within Functional Limits for tasks assessed                      General Comments      Exercises     Assessment/Plan    PT Assessment Patent does not need any further PT services  PT Problem List            PT Treatment Interventions      PT Goals (Current goals can be found in  the Care Plan section)  Acute Rehab PT Goals PT Goal Formulation: All assessment and education complete, DC therapy    Frequency     Barriers to discharge        Co-evaluation               End of Session   Activity Tolerance: Patient tolerated treatment well Patient left: in bed;with call bell/phone within reach;with bed alarm set           Time: [id number] PT Time Calculation (min) (ACUTE ONLY): 23 min   Charges:   PT Evaluation $PT Eval Low Complexity: 1 Procedure     PT G Codes:        Jimena Wieczorek,KATHrine E 07/19/2016, 12:55 PM Carmelia Bake, PT, DPT 07/19/2016 Pager: [phone]

## 2016-07-19 NOTE — Telephone Encounter (Signed)
So sorry. Didn't see this until right now. 7 pm the following day. I will be happy to provide whatever is needed as far as pain management goes. I will be in the office tomorrow morning 12/8 a.m.

## 2016-07-19 NOTE — Progress Notes (Signed)
Daily Progress Note   Patient Name: Alexander Wolf       Date: 07/19/2016 DOB: 1956-04-18  Age: 60 y.o. MRN#: [id number] Attending Physician: Charlynne Cousins, MD Primary Care Physician: [name], MD Admit Date: 07/12/2016  Reason for Consultation/Follow-up: Establishing goals of care and Pain control  Subjective: I met with Alexander Wolf. We talked again about his pain. He reports that overall his pain has been improving as we have been increasing his narcotics.  More importantly, he reports being able to move around and get to the bathroom without restriction from pain.  At the same time, however, he reports that he continues to have episodes of pain that remain uncontrolled with current rescue dose of 52m oxycodone.  He believes that a slight increase in his rescue medication and going home to a more comfortable bed will allow him to have adequate pain control.  We discussed his bowel regimen and he has had good success with sennakot at home prior to admission.  We also continue discussion regarding long-term goals. I still think he has limited insight into the severity of his condition, although this may just be a need for him to have time to emotionally process everything.   Length of Stay: 7  Current Medications: Scheduled Meds:  . atorvastatin  40 mg Oral Daily  . docusate sodium  50 mg Oral BID  . DULoxetine  30 mg Oral Daily  . enoxaparin (LOVENOX) injection  40 mg Subcutaneous Q24H  . hydrocortisone   Rectal TID  . insulin aspart  0-9 Units Subcutaneous TID WC  . LORazepam  1 mg Intravenous Once  . mouth rinse  15 mL Mouth Rinse BID  . metoprolol  50 mg Oral BID  . OLANZapine  5 mg Oral QHS  . oxyCODONE  40 mg Oral Q12H  . pantoprazole  80 mg Oral Daily  . sodium chloride flush   3 mL Intravenous Q12H    Continuous Infusions: . sodium chloride 75 mL/hr at 07/19/16 0520    PRN Meds: acetaminophen **OR** acetaminophen, haloperidol lactate, hydrALAZINE, HYDROmorphone (DILAUDID) injection, lidocaine-EPINEPHrine, methocarbamol, ondansetron **OR** ondansetron (ZOFRAN) IV, [DISCONTINUED] oxyCODONE-acetaminophen **AND** oxyCODONE, polyethylene glycol, sodium chloride flush  Physical Exam    General: Alert, awake, in no acute distress.  HEENT: No bruits, no goiter, no JVD Heart: Regular rate  and rhythm. No murmur appreciated. Lungs: Good air movement, clear Abdomen: Soft, nontender, nondistended, positive bowel sounds.  Ext: No significant edema Skin: Warm and dry Neuro: Grossly intact, nonfocal.       Vital Signs: BP 132/85 (BP Location: Left Arm)   Pulse 99   Temp 99.3 F (37.4 C) (Oral)   Resp 18   Ht _0  (1.778 m)   Wt 99.6 kg (219 lb 9.3 oz)   SpO2 96%   BMI 31.51 kg/m  SpO2: SpO2: 96 % O2 Device: O2 Device: Not Delivered O2 Flow Rate: O2 Flow Rate (L/min): 2 L/min  Intake/output summary:   Intake/Output Summary (Last 24 hours) at 07/19/16 0900 Last data filed at 07/19/16 1610  Gross per 24 hour  Intake             1975 ml  Output             3975 ml  Net            -2000 ml   LBM: Last BM Date: 07/17/16 Baseline Weight: Weight: 93.7 kg (206 lb 9.1 oz) Most recent weight: Weight: 99.6 kg (219 lb 9.3 oz)       Palliative Assessment/Data:    Flowsheet Rows   Flowsheet Row Most Recent Value  Intake Tab  Referral Department  Hospitalist  Unit at Time of Referral  Med/Surg Unit  Palliative Care Primary Diagnosis  Cancer  Date Notified  07/17/16  Palliative Care Type  New Palliative care  Reason for referral  Pain, Clarify Goals of Care  Date of Admission  07/12/16  Date first seen by Palliative Care  07/17/16  # of days Palliative referral response time  0 Day(s)  # of days IP prior to Palliative referral  5  Clinical Assessment    Palliative Performance Scale Score  50%  Pain Max last 24 hours  9  Pain Min Last 24 hours  3  Psychosocial & Spiritual Assessment  Palliative Care Outcomes  Patient/Family meeting held?  Yes  Who was at the meeting?  Patient  Palliative Care Outcomes  Improved pain interventions      Patient Active Problem List   Diagnosis Date Noted  . Palliative care encounter   . Goals of care, counseling/discussion   . Neoplasm related pain   . Bilateral hydronephrosis   . Metastatic cancer ([place])   . Nonspecific abnormal electrocardiogram (ECG) (EKG) 07/13/2016  . Hyperkalemia 07/13/2016  . Elevated lactic acid level 07/13/2016  . Uremia 07/13/2016  . Acute bilateral obstructive uropathy 07/13/2016  . Anemia of chronic disease 07/13/2016  . Hypocalcemia 07/13/2016  . Acute renal failure ([place]) 07/12/2016  . Port catheter in place 06/05/2016  . Constipation 05/30/2016  . Malignant neoplasm of urinary bladder ([place]) 04/10/2016  . Nodular prostate with urinary obstruction 03/30/2016  . BPH (benign prostatic hypertrophy) with urinary retention 03/29/2016  . Coronary artery disease involving coronary bypass graft of native heart without angina pectoris 04/04/2014  . Essential hypertension 04/04/2014  . Hyperlipidemia LDL goal <70 04/04/2014  . Type 2 diabetes mellitus not at goal [facility]) 04/04/2014  . Hx of bladder cancer 04/04/2014    Palliative Care Assessment & Plan   Patient Profile: 60 y.o. male  with past medical history of bladder cancer, DM, CAD s/p CABG, HTN, and HLD  admitted on 07/12/2016 with abnormal labs and inability to urinate.  He was found to have rapidly progressive bladder cancer with bilateral hydronephrosis secondary to obstructive uropathy with  uremia. He is status post percutaneous nephrostomy tube placement on 11/30.  He continues to have a in his left leg and lower abdomen. Palliative consulted for pain management as well as initiation of discussion of goals of care in  light of his advanced cancer.  Assessment: Stage IV bladder cancer  Neoplasm related pain  Recommendations/Plan:  Pain: He reports that his pain overall seems much better controlled on current regimen.  He still has episodic pain that escalates quickly it is not relieved until he gets rescue medication.  Current dose of 8m oxycodone for rescue med still not effectively relieving his pain and he has also been getting some backup dilaudid.  In the last 24 hours, he has used the equivalent of 1663moxycodone (607mxycontin, 38m38m oxycodone, and 6mg 87mdilaudid (equiv to 66mg 26modone)).  Yesterday he also used equivalent of over 140 mg of oral oxycodone.  As the rescue dose ought to be approximately 10% of 24 hour dose and he reports pain is more intermittent and not relieved by 10mg o54mdone, will increase rescue dose to oxycodone 15mg ev73m3 hours as needed.  I would continue OxyContin 40 mg twice daily. I think this is a reasonable regimen for discharge.  Would continue Cymbalta as this may be beneficial both in regard to his mood as well as musculoskeletal pain. Started dose of 30 mg and would continue this for 1 week. At that point in time I would increase to 60 mg daily. I called and left a message with his outpatient primary care provider in order to discuss options for pain management on discharge.   Constipation: Opioid related.  Plan for transition from colace to senna 2 tabs twice daily (he was taking this at home and senna is more effective for opioid related constipation than colace) and miralax as needed.    Goals of care: We have been talking daily about the fact that he has a noncurable illness and that there is a high chance that he may not benefit from pursuing disease modifying therapy much longer. He wants to follow-up with oncology as an outpatient to discuss with them.  Reports that he does not want to make any changes to his care plan until this occurs.  I have also been  speaking with his wife.  She has better insight into the severity of his condition.  Goals of Care and Additional Recommendations:  Limitations on Scope of Treatment: Full Scope Treatment  Code Status:    Code Status Orders        Start     Ordered   07/12/16 2356  Full code  Continuous     07/12/16 2355    Code Status History    Date Active Date Inactive Code Status Order ID Comments User Context   03/29/2016 12:44 PM 03/30/2016  1:42 PM Full Code [id number] Franchot Galloatient       Prognosis:   Unable to determine, however, If he were to forego further disease modifying therapy, his expected prognosis would be less than 6 months and he should qualify for hospice support if so desired any point in the future  Discharge Planning:  To Be Determined (PT consult pending)  Care plan was discussed with patient  Thank you for allowing the Palliative Medicine Team to assist in the care of this patient.   Time In: 0815 Time Out: 0900 Total Time 45 Prolonged Time Billed No      Greater than 50%  of this time was spent counseling and coordinating care related to the above assessment and plan.  Micheline Rough, MD  Please contact Palliative Medicine Team phone at [phone] for questions and concerns.

## 2016-07-19 NOTE — Telephone Encounter (Signed)
Patient's wife Alexander Wolf calling. She is not taking patient home from the hospital until she knows someone will refill his pain mediation. The palliative care physician only gave him 7 days worth. His appt to see dr Alen Blew is not until 12/26 and she wants this appt sooner.

## 2016-07-19 NOTE — Telephone Encounter (Signed)
I can see him on 12/14 1:30 no labs. Please send a message to scheduling.

## 2016-07-19 NOTE — Care Management Note (Signed)
Case Management Note  Patient Details  Name: Alexander Wolf MRN: [id number] Date of Birth: [birth year]  Subjective/Objective: PT cons-await recc for d/c plan.                   Action/Plan:   Expected Discharge Date:   (unknown)               Expected Discharge Plan:  Home/Self Care  In-House Referral:     Discharge planning Services  CM Consult  Post Acute Care Choice:    Choice offered to:     DME Arranged:    DME Agency:     HH Arranged:    HH Agency:     Status of Service:  In process, will continue to follow  If discussed at Long Length of Stay Meetings, dates discussed:    Additional Comments:  Dessa Phi, RN 07/19/2016, 9:53 AM

## 2016-07-19 NOTE — Care Management Note (Signed)
Case Management Note  Patient Details  Name: Alexander Wolf MRN: [id number] Date of Birth: [birth year]  Subjective/Objective: PT-no f/u. No further CM needs.                   Action/Plan:d/c home.   Expected Discharge Date:   (unknown)               Expected Discharge Plan:  Home/Self Care  In-House Referral:     Discharge planning Services  CM Consult  Post Acute Care Choice:    Choice offered to:     DME Arranged:    DME Agency:     HH Arranged:    [place] Agency:     Status of Service:  Completed, signed off  If discussed at [employer] of Stay Meetings, dates discussed:    Additional Comments:  Dessa Phi, RN 07/19/2016, 12:47 PM

## 2016-07-19 NOTE — Telephone Encounter (Signed)
Spoke with Alexander Wolf, per dr Alen Blew, he can see patient sooner. 12/14 @ 1:30. pof to schedulers.

## 2016-07-19 NOTE — Discharge Summary (Addendum)
Physician Discharge Summary  Alexander Wolf [id number] DOB: [birth year] DOA: 07/12/2016  PCP: [name], MD  Admit date: 07/12/2016 Discharge date: 07/19/2016  Admitted From: home Disposition:  Home  Recommendations for Outpatient Follow-up:  1. Follow up with Oncology in 1-2 weeks, As the patient wants to pursue aggressive treatment. 2. Follow-up with urology as an outpatient in 2 weeks. 3. Please obtain BMP/CBC in one week 4. Will need to discuss his prognosis about his condition.   Home Health:Yes Equipment/Devices:none  Discharge Condition:guarded CODE STATUS:full Diet recommendation: Regular  Brief/Interim Summary: 60 year old male with past medical history significant for bladder cancer that had labs today and showed elevated creatinine and potassium.  Discharge Diagnoses:  Principal Problem:   Acute renal failure (HCC) Active Problems:   Malignant neoplasm of urinary bladder (HCC)   Nonspecific abnormal electrocardiogram (ECG) (EKG)   Hyperkalemia   Elevated lactic acid level   Uremia   Acute bilateral obstructive uropathy   Anemia of chronic disease   Hypocalcemia   Bilateral hydronephrosis   Metastatic cancer ([place])   Palliative care encounter   Goals of care, counseling/discussion   Neoplasm related pain  Acute renal failure due to  Acute bilateral obstructive uropathy [facility]): Baseline creatinine of 1.3 or less, creatinine on admission greater than 18. Patient is status post percutaneous ostomy tube by IR on 07/12/2016. Creatinine has rapidly improved. He was continued on IV fluid throughout his hospital stay and his creatinine has improved close to baseline. Physical therapy evaluated the patient and recommended  Metabolic encephalopathy/confusion: Likely due to uremia, with concern for acute confusional state. Held benzodiazepines, he was treated with surgical Zyprexa and Haldol as needed. Does not resolve.  Hyperkalemia/hypocalcemia: Successfully  treated in the ED with IV calcium gluconate and Kayexalate. With no events on telemetry.  Malignant bladder neoplasm high-grade urothelial carcinoma: We'll continue to follow as an outpatient with oncology and urology. 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 Palliative care team following, the patient has poor prognoses.  Anemia of chronic disease: Status post 1 unit of packed red blood cells on 07/15/2013. No signs of acute bleeding will continue to monitor hemoglobin. Check CBC as an outpatient.  Chronic pain syndrome: Likely associated with cancer Palliative care managing narcotics.  Metabolic acidosis: Likely due to acute renal failure now resolved.  Hypomagnesemia/hypokalemia: Currently stable and resolved.  Discharge Instructions  Discharge Instructions    Diet - low sodium heart healthy    Complete by:  As directed    Increase activity slowly    Complete by:  As directed        Medication List    STOP taking these medications   amitriptyline 25 MG tablet Commonly known as:  ELAVIL   diazepam 5 MG tablet Commonly known as:  VALIUM   FISH OIL 300 MG Caps   metFORMIN 1000 MG tablet Commonly known as:  GLUCOPHAGE   oxyCODONE-acetaminophen 10-325 MG tablet Commonly known as:  PERCOCET   polyethylene glycol powder powder Commonly known as:  GLYCOLAX/MIRALAX   prochlorperazine 10 MG tablet Commonly known as:  COMPAZINE     TAKE these medications   atorvastatin 40 MG tablet Commonly known as:  LIPITOR Take 1 tablet (40 mg total) by mouth daily.   docusate sodium 100 MG capsule Commonly known as:  COLACE Take 1 capsule (100 mg total) by mouth 2 (two)  times daily as needed for mild constipation. What changed:  when to take this   DULoxetine 30 MG capsule Commonly known as:  CYMBALTA Take 1 capsule (30 mg  total) by mouth daily. Start taking on:  07/20/2016   fentaNYL 75 MCG/HR Commonly known as:  DURAGESIC - dosed mcg/hr Place 1 patch (75 mcg total) onto the skin every 3 (three) days. Start taking on:  07/22/2016   glipiZIDE 5 MG tablet Commonly known as:  GLUCOTROL TAKE 1 TABLET BY MOUTH TWICE DAILY BEFORE A MEAL What changed:  See the new instructions.   lidocaine 5 % ointment Commonly known as:  XYLOCAINE Apply 1 application topically as needed. What changed:  reasons to take this   lidocaine-prilocaine cream Commonly known as:  EMLA Apply to port before chemotherapy.   metoprolol 50 MG tablet Commonly known as:  LOPRESSOR TAKE 1/2 TABLET BY MOUTH TWICE DAILY   omeprazole 40 MG capsule Commonly known as:  PRILOSEC TAKE 1 CAPSULE BY MOUTH EVERY DAY   oxyCODONE 15 MG immediate release tablet Commonly known as:  ROXICODONE Take 1 tablet (15 mg total) by mouth every 3 (three) hours as needed for moderate pain.   zolpidem 10 MG tablet Commonly known as:  AMBIEN TAKE 1 TABLET BY MOUTH EVERY NIGHT AT BEDTIME AS NEEDED FOR SLEEP       No Known Allergies  Consultations:  Urology  Palliative care   Procedures/Studies: Ct Abdomen Pelvis Wo Contrast  Result Date: 07/12/2016 CLINICAL DATA:  60 year old male with acute renal failure, anuria and constipation for 7 days. Patient with bladder cancer, currently on chemotherapy. EXAM: CT ABDOMEN AND PELVIS WITHOUT CONTRAST TECHNIQUE: Multidetector CT imaging of the abdomen and pelvis was performed following the standard protocol without IV contrast. COMPARISON:  03/19/2016 FINDINGS: Please note that parenchymal abnormalities may be missed without intravenous contrast. Lower chest: No acute abnormality. Hepatobiliary: A right hepatic cyst is again noted. No other hepatic or gallbladder abnormalities identified. There is no evidence of biliary dilatation. Pancreas: Unremarkable Spleen: Unremarkable Adrenals/Urinary Tract: New  moderate to severe bilateral hydroureteronephrosis to the bladder is present. This is caused by an enlarging bladder mass, now measuring 3.7 x 8.3 cm with new masses/adenopathy throughout the pelvis. A conglomerate mass posterior to the bladder measures 6.5 x 11.1 cm. The adrenal glands are unremarkable. Stomach/Bowel: No bowel obstruction or focal bowel wall thickening. Vascular/Lymphatic: Aortic atherosclerotic calcifications noted without aneurysm. New lymphadenopathy throughout the pelvis identified. Other: No free fluid or pneumoperitoneum. Musculoskeletal: No acute bony abnormality. IMPRESSION: New moderate to severe bilateral hydronephrosis from significantly enlarged bladder mass/malignancy and metastatic disease/lymphadenopathy within the pelvis. Abdominal aortic atherosclerosis. Electronically Signed   By: Margarette Canada M.D.   On: 07/12/2016 19:21   Ir Nephrostomy Placement Left  Result Date: 07/13/2016 INDICATION: Rapidly progressive bladder cancer, now with bilateral hydronephrosis secondary to obstructive uropathy with the uremia. Please perform ultrasound and fluoroscopic guided bilateral nephrostomy catheter placement. EXAM: 1. ULTRASOUND GUIDANCE FOR PUNCTURE OF THE BILATERAL RENAL COLLECTING SYSTEMS 2. BILATERAL PERCUTANEOUS NEPHROSTOMY TUBE PLACEMENT. COMPARISON:  CT abdomen pelvis - 07/12/2016; 03/19/2016 MEDICATIONS: Ciprofloxacin 400 mg IV; The antibiotic was administered in an appropriate time frame prior to skin puncture. ANESTHESIA/SEDATION: Fentanyl 100 mcg IV; Versed 4 mg IV Total Moderate Sedation Time 26 minutes. CONTRAST:  A total of 20 mL Isovue 300 was administered administered into the bilateral renal collecting systems FLUOROSCOPY TIME:  4 minutes 6 seconds (Q000111Q mGy) COMPLICATIONS: None immediate. PROCEDURE: The procedure, risks, benefits, and alternatives were  explained to the patient's wife. Questions regarding the procedure were encouraged and answered. The patient's wife  understands and consents to the procedure. A timeout was performed prior to the initiation of the procedure. The bilateral flanks were prepped with Betadine in a sterile fashion, and a sterile drape was applied covering the operative field. A sterile gown and sterile gloves were used for the procedure. Local anesthesia was provided with 1% Lidocaine with epinephrine. Beginning with the left kidney, under direct ultrasound guidance, a 21 gauge needle was advanced into the renal collecting system. An ultrasound image documentation was performed. Access within the collecting system was confirmed with the efflux of urine followed by contrast injection. Over a Nitrex wire, the track was dilated with an Accustick set. Over a guide wire, a 10-French percutaneous nephrostomy catheter was advanced into the collecting system where the coil was formed and locked. Contrast was injected and several sport radiographs were obtained in various obliquities confirming access. The catheter was secured at the skin with a Prolene retention suture and a gravity bag was placed. Attention was now paid towards the right-sided nephrostomy. Again, a 21 gauge needle was advanced into the renal collecting system. An ultrasound image documentation was performed. Access within the collecting system was confirmed with the efflux of urine followed by contrast injection. Over a Nitrex wire, the track was dilated with an Accustick set. Over a guide wire, a 10-French percutaneous nephrostomy catheter was advanced into the collecting system where the coil was formed and locked. Contrast was injected and several sport radiographs were obtained in various obliquities confirming access. The catheter was secured at the skin with a Prolene retention suture and a gravity bag was placed. Dressings were placed. The patient tolerated the procedure well without immediate postprocedural complication. FINDINGS: Ultrasound scanning demonstrates a mild to slightly  moderately dilated collecting system. Under direct ultrasound guidance, a posterior inferior calix was targeted bilaterally allowing advancement of 10-French percutaneous nephrostomy catheters bilaterally under intermittent fluoroscopic guidance. Contrast injection confirmed appropriate positioning. IMPRESSION: Successful ultrasound and fluoroscopic guided placement of bilateral 10 French PCNs. Electronically Signed   By: Sandi Mariscal M.D.   On: 07/13/2016 08:08   Ir Nephrostomy Placement Right  Result Date: 07/13/2016 INDICATION: Rapidly progressive bladder cancer, now with bilateral hydronephrosis secondary to obstructive uropathy with the uremia. Please perform ultrasound and fluoroscopic guided bilateral nephrostomy catheter placement. EXAM: 1. ULTRASOUND GUIDANCE FOR PUNCTURE OF THE BILATERAL RENAL COLLECTING SYSTEMS 2. BILATERAL PERCUTANEOUS NEPHROSTOMY TUBE PLACEMENT. COMPARISON:  CT abdomen pelvis - 07/12/2016; 03/19/2016 MEDICATIONS: Ciprofloxacin 400 mg IV; The antibiotic was administered in an appropriate time frame prior to skin puncture. ANESTHESIA/SEDATION: Fentanyl 100 mcg IV; Versed 4 mg IV Total Moderate Sedation Time 26 minutes. CONTRAST:  A total of 20 mL Isovue 300 was administered administered into the bilateral renal collecting systems FLUOROSCOPY TIME:  4 minutes 6 seconds (Q000111Q mGy) COMPLICATIONS: None immediate. PROCEDURE: The procedure, risks, benefits, and alternatives were explained to the patient's wife. Questions regarding the procedure were encouraged and answered. The patient's wife understands and consents to the procedure. A timeout was performed prior to the initiation of the procedure. The bilateral flanks were prepped with Betadine in a sterile fashion, and a sterile drape was applied covering the operative field. A sterile gown and sterile gloves were used for the procedure. Local anesthesia was provided with 1% Lidocaine with epinephrine. Beginning with the left kidney,  under direct ultrasound guidance, a 21 gauge needle was advanced into the renal collecting system. An  ultrasound image documentation was performed. Access within the collecting system was confirmed with the efflux of urine followed by contrast injection. Over a Nitrex wire, the track was dilated with an Accustick set. Over a guide wire, a 10-French percutaneous nephrostomy catheter was advanced into the collecting system where the coil was formed and locked. Contrast was injected and several sport radiographs were obtained in various obliquities confirming access. The catheter was secured at the skin with a Prolene retention suture and a gravity bag was placed. Attention was now paid towards the right-sided nephrostomy. Again, a 21 gauge needle was advanced into the renal collecting system. An ultrasound image documentation was performed. Access within the collecting system was confirmed with the efflux of urine followed by contrast injection. Over a Nitrex wire, the track was dilated with an Accustick set. Over a guide wire, a 10-French percutaneous nephrostomy catheter was advanced into the collecting system where the coil was formed and locked. Contrast was injected and several sport radiographs were obtained in various obliquities confirming access. The catheter was secured at the skin with a Prolene retention suture and a gravity bag was placed. Dressings were placed. The patient tolerated the procedure well without immediate postprocedural complication. FINDINGS: Ultrasound scanning demonstrates a mild to slightly moderately dilated collecting system. Under direct ultrasound guidance, a posterior inferior calix was targeted bilaterally allowing advancement of 10-French percutaneous nephrostomy catheters bilaterally under intermittent fluoroscopic guidance. Contrast injection confirmed appropriate positioning. IMPRESSION: Successful ultrasound and fluoroscopic guided placement of bilateral 10 French PCNs.  Electronically Signed   By: Sandi Mariscal M.D.   On: 07/13/2016 08:08     Subjective: He relates he feels great wants to go home.  Discharge Exam: Vitals:   07/18/16 2130 07/19/16 0558  BP: (!) 145/92 132/85  Pulse: (!) 107 99  Resp: 20 18  Temp: 97.9 F (36.6 C) 99.3 F (37.4 C)   Vitals:   07/18/16 0611 07/18/16 1405 07/18/16 2130 07/19/16 0558  BP: 131/68 132/74 (!) 145/92 132/85  Pulse: (!) 103 90 (!) 107 99  Resp: 20 20 20 18   Temp: 97.7 F (36.5 C) 98.6 F (37 C) 97.9 F (36.6 C) 99.3 F (37.4 C)  TempSrc: Oral Oral Oral Oral  SpO2: 96% 98% 93% 96%  Weight:      Height:        General: Pt is alert, awake, not in acute distress Cardiovascular: RRR, S1/S2 +, no rubs, no gallops Respiratory: CTA bilaterally, no wheezing, no rhonchi Abdominal: Soft, NT, ND, bowel sounds + Extremities: no edema, no cyanosis    The results of significant diagnostics from this hospitalization (including imaging, microbiology, ancillary and laboratory) are listed below for reference.     Microbiology: Recent Results (from the past 240 hour(s))  MRSA PCR Screening     Status: None   Collection Time: 07/12/16 11:59 PM  Result Value Ref Range Status   MRSA by PCR NEGATIVE NEGATIVE Final    Comment:        The GeneXpert MRSA Assay (FDA approved for NASAL specimens only), is one component of a comprehensive MRSA colonization surveillance program. It is not intended to diagnose MRSA infection nor to guide or monitor treatment for MRSA infections.      Labs: BNP (last 3 results) No results for input(s): BNP in the last 8760 hours. Basic Metabolic Panel:  Recent Labs Lab 07/15/16 0512 07/16/16 0555 07/17/16 0245 07/18/16 0418 07/19/16 0600  NA 141 139 140 139 138  K 3.0* 3.1* 3.6  3.6 3.2*  CL 115* 108 110 106 104  CO2 16* 20* 22 24 24   GLUCOSE 121* 120* 118* 152* 142*  BUN 31* 28* 26* 26* 22*  CREATININE 2.75* 2.37* 2.18* 1.94* 1.79*  CALCIUM 7.4* 8.0* 8.8* 8.7*  8.8*  MG 0.9* 1.3* 1.5* 1.3*  --    Liver Function Tests: No results for input(s): AST, ALT, ALKPHOS, BILITOT, PROT, ALBUMIN in the last 168 hours. No results for input(s): LIPASE, AMYLASE in the last 168 hours. No results for input(s): AMMONIA in the last 168 hours. CBC:  Recent Labs Lab 07/13/16 0327 07/15/16 0512 07/16/16 0555 07/18/16 0418  WBC 9.8 11.3* 10.3 11.9*  HGB 8.0* 7.3* 8.5* 9.4*  HCT 24.5* 22.3* 25.2* 28.6*  MCV 83.6 88.5 87.2 88.0  PLT 265 210 252 316   Cardiac Enzymes:  Recent Labs Lab 07/13/16 0327 07/13/16 0913 07/13/16 1528  TROPONINI 0.07* 0.13* 0.15*   BNP: Invalid input(s): POCBNP CBG:  Recent Labs Lab 07/18/16 1157 07/18/16 1652 07/18/16 2134 07/19/16 0728 07/19/16 1128  GLUCAP 146* 160* 172* 135* 141*   D-Dimer No results for input(s): DDIMER in the last 72 hours. Hgb A1c No results for input(s): HGBA1C in the last 72 hours. Lipid Profile No results for input(s): CHOL, HDL, LDLCALC, TRIG, CHOLHDL, LDLDIRECT in the last 72 hours. Thyroid function studies No results for input(s): TSH, T4TOTAL, T3FREE, THYROIDAB in the last 72 hours.  Invalid input(s): FREET3 Anemia work up No results for input(s): VITAMINB12, FOLATE, FERRITIN, TIBC, IRON, RETICCTPCT in the last 72 hours. Urinalysis    Component Value Date/Time   COLORURINE YELLOW 03/10/2016 0941   APPEARANCEUR HAZY (A) 03/10/2016 0941   LABSPEC 1.017 03/10/2016 0941   PHURINE 6.0 03/10/2016 0941   GLUCOSEU 100 (A) 03/10/2016 0941   HGBUR LARGE (A) 03/10/2016 0941   BILIRUBINUR negative 04/07/2016 1446   BILIRUBINUR neg 09/24/2014 0851   KETONESUR negative 04/07/2016 1446   KETONESUR NEGATIVE 03/10/2016 0941   PROTEINUR =100 (A) 04/07/2016 1446   PROTEINUR 100 (A) 03/10/2016 0941   UROBILINOGEN 0.2 04/07/2016 1446   NITRITE Negative 04/07/2016 1446   NITRITE NEGATIVE 03/10/2016 0941   LEUKOCYTESUR Trace (A) 04/07/2016 1446   Sepsis Labs Invalid input(s): PROCALCITONIN,   WBC,  LACTICIDVEN Microbiology Recent Results (from the past 240 hour(s))  MRSA PCR Screening     Status: None   Collection Time: 07/12/16 11:59 PM  Result Value Ref Range Status   MRSA by PCR NEGATIVE NEGATIVE Final    Comment:        The GeneXpert MRSA Assay (FDA approved for NASAL specimens only), is one component of a comprehensive MRSA colonization surveillance program. It is not intended to diagnose MRSA infection nor to guide or monitor treatment for MRSA infections.      Time coordinating discharge: Over 30 minutes  SIGNED:   Charlynne Cousins, MD  [facility] 07/19/2016, 6:10 PM Pager   If 7PM-7AM, please contact night-coverage [email or web address] Password [email or web address]

## 2016-07-19 NOTE — Progress Notes (Signed)
Pt requested that RN contact Dr. Domingo Cocking with Palliative Care to review home pain management regimen.  Dr. Domingo Cocking notified and pt concerns/ questions addressed.  Dr. Domingo Cocking discussed with RN and asked RN to relay information to the pt that the present dc pain management plan was appropriate and that Dr. Alen Blew had been notified and will be assisting in the pain management after dc. Pt has appt scheduled to follow up. Stacey Drain

## 2016-07-20 ENCOUNTER — Encounter: Payer: Self-pay | Admitting: Radiation Oncology

## 2016-07-25 ENCOUNTER — Telehealth: Payer: Self-pay | Admitting: Oncology

## 2016-07-25 ENCOUNTER — Ambulatory Visit
Admission: RE | Admit: 2016-07-25 | Discharge: 2016-07-25 | Disposition: A | Discharge status: Home / Self Care | Payer: 59 | Source: Ambulatory Visit | Attending: Radiation Oncology | Admitting: Radiation Oncology

## 2016-07-25 ENCOUNTER — Telehealth: Payer: Self-pay | Admitting: *Deleted

## 2016-07-25 ENCOUNTER — Encounter: Payer: Self-pay | Admitting: *Deleted

## 2016-07-25 ENCOUNTER — Ambulatory Visit (HOSPITAL_BASED_OUTPATIENT_CLINIC_OR_DEPARTMENT_OTHER): Payer: 59 | Admitting: Oncology

## 2016-07-25 ENCOUNTER — Other Ambulatory Visit: Payer: Self-pay | Admitting: *Deleted

## 2016-07-25 ENCOUNTER — Encounter: Payer: Self-pay | Admitting: Radiation Oncology

## 2016-07-25 VITALS — BP 140/93 | HR 78 | Temp 97.5°F | Resp 16 | Ht 71.0 in | Wt 198.0 lb

## 2016-07-25 VITALS — BP 138/83 | HR 91 | Temp 97.9°F | Resp 20 | Ht 71.0 in

## 2016-07-25 DIAGNOSIS — R338 Other retention of urine: Secondary | ICD-10-CM | POA: Insufficient documentation

## 2016-07-25 DIAGNOSIS — K219 Gastro-esophageal reflux disease without esophagitis: Secondary | ICD-10-CM | POA: Insufficient documentation

## 2016-07-25 DIAGNOSIS — C679 Malignant neoplasm of bladder, unspecified: Secondary | ICD-10-CM | POA: Diagnosis not present

## 2016-07-25 DIAGNOSIS — I2510 Atherosclerotic heart disease of native coronary artery without angina pectoris: Secondary | ICD-10-CM | POA: Diagnosis not present

## 2016-07-25 DIAGNOSIS — I700 Atherosclerosis of aorta: Secondary | ICD-10-CM | POA: Insufficient documentation

## 2016-07-25 DIAGNOSIS — R591 Generalized enlarged lymph nodes: Secondary | ICD-10-CM | POA: Diagnosis not present

## 2016-07-25 DIAGNOSIS — E119 Type 2 diabetes mellitus without complications: Secondary | ICD-10-CM | POA: Insufficient documentation

## 2016-07-25 DIAGNOSIS — N403 Nodular prostate with lower urinary tract symptoms: Secondary | ICD-10-CM

## 2016-07-25 DIAGNOSIS — I10 Essential (primary) hypertension: Secondary | ICD-10-CM | POA: Diagnosis not present

## 2016-07-25 DIAGNOSIS — G4700 Insomnia, unspecified: Secondary | ICD-10-CM | POA: Insufficient documentation

## 2016-07-25 DIAGNOSIS — Z87891 Personal history of nicotine dependence: Secondary | ICD-10-CM | POA: Insufficient documentation

## 2016-07-25 DIAGNOSIS — R52 Pain, unspecified: Secondary | ICD-10-CM

## 2016-07-25 DIAGNOSIS — C674 Malignant neoplasm of posterior wall of bladder: Secondary | ICD-10-CM

## 2016-07-25 DIAGNOSIS — E785 Hyperlipidemia, unspecified: Secondary | ICD-10-CM | POA: Diagnosis not present

## 2016-07-25 DIAGNOSIS — Z8551 Personal history of malignant neoplasm of bladder: Secondary | ICD-10-CM

## 2016-07-25 DIAGNOSIS — N401 Enlarged prostate with lower urinary tract symptoms: Secondary | ICD-10-CM | POA: Diagnosis not present

## 2016-07-25 DIAGNOSIS — N1330 Unspecified hydronephrosis: Secondary | ICD-10-CM | POA: Diagnosis not present

## 2016-07-25 DIAGNOSIS — C670 Malignant neoplasm of trigone of bladder: Secondary | ICD-10-CM

## 2016-07-25 DIAGNOSIS — Z951 Presence of aortocoronary bypass graft: Secondary | ICD-10-CM | POA: Diagnosis not present

## 2016-07-25 MED ORDER — OXYCODONE HCL 15 MG PO TABS: 15.0000 mg | ORAL_TABLET | ORAL | 0 refills | Status: DC | PRN

## 2016-07-25 MED ORDER — FENTANYL 100 MCG/HR TD PT72: 100.0000 ug | MEDICATED_PATCH | TRANSDERMAL | 0 refills | Status: DC

## 2016-07-25 MED ORDER — FENTANYL 25 MCG/HR TD PT72: 25.0000 ug | MEDICATED_PATCH | TRANSDERMAL | 0 refills | Status: DC

## 2016-07-25 NOTE — Telephone Encounter (Signed)
Happy to manage his pain medicine if needed but would need f/u OV.  For now, pt's pain medication is being rxed by his oncologist Dr. Loyal Jacobson

## 2016-07-25 NOTE — Telephone Encounter (Signed)
Appointments scheduled per 12/13 LOS. Patient given AVS report and calendars with future scheduled appointments.  °

## 2016-07-25 NOTE — Progress Notes (Signed)
Per Dr. Alen Blew, I made a referral for home health for physical therapy and to manage nephrostomy tubes as needed. Referral made to [facility]. Fax # is [phone].

## 2016-07-25 NOTE — Progress Notes (Signed)
[place]         [phone] ________________________________  Initial Outpatient Consultation  Name: Alexander Wolf MRN: [id number]  Date: 07/25/2016  DOB: 12/07/1955  REFERRING PHYSICIAN: Wyatt Portela, MD  DIAGNOSIS: 60 year old gentleman with urothelial carcinoma of the bladder, initially stage T4a, now with progression of pelvic involvement.    ICD-9-CM ICD-10-CM   1. Hx of bladder cancer V10.51 Z85.51 CT Chest W Contrast  2. Malignant neoplasm of posterior wall of urinary bladder (HCC) 188.4 C67.4     HISTORY OF PRESENT ILLNESS::Alexander Wolf is a 60 y.o. male with a primary diagnosis of malignant neoplasm of urinary bladder.  He has a history of hypertension, hyperlipidemia, and diabetes. He was diagnosed with bladder cancer 3 years ago and was treated by a urologist at [facility]. He had recurrent TURBT but no local treatment. He was recently evaluated by Dr. Diona Fanti for urinary retention and a CT scan of the abdomen and pelvis in August 2017 showed a 1.5 cm lesion in the bladder neck area. There is no evidence of disease outside of the bladder. He was initially seen in the emergency department and subsequently cystoscopic examination in the office was impossible due to obstruction and pain. He underwent TURBT on 03/30/2016 as well as TURP and the left bladder wall biopsy. He tolerated the procedure well and felt better since that time. The pathology from the procedure showed high-grade papillary urothelial carcinoma with tumor invades into the muscularis propria. There is also invasion with high-grade urothelial carcinoma involving the prostatic tissue. No other tumors were identified.  The patient received neoadjuvant chemotherapy with cisplatin and gemcitabine beginning on 04/24/16.  CT scan obtained on 07/12/16 showed rapidly enlarging tumor with a bladder mass measuring 3.7 x 8.3 cm with no masses in adenopathy throughout the pelvis. There is also  a mass posterior to the bladder measuring 6.5 x 11.1 cm. Per Dr. Alen Blew, the patient will likely receive salvage chemotherapy, as he is likely not a candidate for surgery at this time.  The patient reports a 30 lbs weight loss in the last 6 months. He reports his posterior bladder tumor makes him feel "constipated even when he isn't." He reports that he either has diarrhea or no movement of stool at all; he also reports hematochezia. He reports clear yellow urine from right stent, and cloudy yellow urine from the left stent. His wife, present with him today, reports he has not been referred to a physician for management of his nephrostomy tubes. The patient reports nausea and vomiting. He experiences bilateral lower leg pain related to effects of neuropathy. His wife also reports "he can't sit; he spends his day on his side or on his belly."  The patient is scheduled to see Dr. Alen Blew today at 12:15 to discuss chemotherapy.  PREVIOUS RADIATION THERAPY: No  Past Medical History:  Diagnosis Date  . Bladder cancer ([place]) 2014   [facility] in [place], [place]; /  urologist in [place]-  dr Diona Fanti; lost to f/u, redeveloped problems in 7/17  . BPH (benign prostatic hypertrophy) with urinary retention   . Coronary artery disease    s/p CABG  (per pt hasn't seen cardiologist for several years)  . Foley catheter in place   . GERD (gastroesophageal reflux disease)   . Hyperlipidemia   . Hypertension   . Insomnia   . 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 Type 2 diabetes mellitus (HCC)   :  Past Surgical History:  Procedure Laterality Date  . CARDIAC CATHETERIZATION  10/26/2003  dr Leonia Reeves   ETT + ischemia/  severe 3 vessel avcad,  normal LVF, ef 55-60%  . CORONARY ARTERY BYPASS GRAFT  10-28-2003   dr Ricard Dillon   LIMA to dLAD,  RIMA to OM1,  SVG to OM2,  SVG to  right posterolateral   . CYSTOSCOPY N/A 03/29/2016   Procedure: CYSTOSCOPY;  Surgeon: Franchot Gallo, MD;  Location: [facility];   Service: Urology;  Laterality: N/A;  . IR GENERIC HISTORICAL  04/19/2016   IR US GUIDE VASC ACCESS RIGHT 04/19/2016 Corrie Mckusick, DO [facility]  . IR GENERIC HISTORICAL  04/19/2016   IR FLUORO GUIDE PORT INSERTION RIGHT 04/19/2016 Corrie Mckusick, DO [facility]  . IR GENERIC HISTORICAL  07/12/2016   IR NEPHROSTOMY PLACEMENT RIGHT 07/12/2016 Sandi Mariscal, MD [facility]  . IR GENERIC HISTORICAL  07/12/2016   IR NEPHROSTOMY PLACEMENT LEFT 07/12/2016 Sandi Mariscal, MD [facility]  . TONSILLECTOMY  as child  . TRANSURETHRAL RESECTION OF BLADDER TUMOR  2014  . TRANSURETHRAL RESECTION OF BLADDER TUMOR WITH GYRUS (TURBT-GYRUS) N/A 03/29/2016   Procedure: TRANSURETHRAL RESECTION OF BLADDER TUMOR WITH GYRUS (TURBT-GYRUS);  Surgeon: Franchot Gallo, MD;  Location: [facility];  Service: Urology;  Laterality: N/A;  . TRANSURETHRAL RESECTION OF PROSTATE N/A 03/29/2016   Procedure: TRANSURETHRAL RESECTION OF THE PROSTATE (TURP);  Surgeon: Franchot Gallo, MD;  Location[facility];  Service: Urology;  Laterality: N/A;  :   Current Outpatient Prescriptions:  .  docusate sodium (COLACE) 100 MG capsule, Take 1 capsule (100 mg total) by mouth 2 (two) times daily as needed for mild constipation. (Patient taking differently: Take 100 mg by mouth 2 (two) times daily. ), Disp: 60 capsule, Rfl: 0 .  DULoxetine (CYMBALTA) 30 MG capsule, Take 1 capsule (30 mg total) by mouth daily., Disp: 30 capsule, Rfl: 0 .  glipiZIDE (GLUCOTROL) 5 MG tablet, TAKE 1 TABLET BY MOUTH TWICE DAILY BEFORE A MEAL, Disp: 180 tablet, Rfl: 0 .  lidocaine-prilocaine (EMLA) cream, Apply to port before chemotherapy., Disp: 30 g, Rfl: 0 .  lisinopril (PRINIVIL,ZESTRIL) 20 MG tablet, , Disp: , Rfl: 1 .  metoprolol (LOPRESSOR) 50 MG tablet, TAKE 1/2 TABLET BY MOUTH TWICE DAILY, Disp: 90 tablet, Rfl: 0 .  omeprazole (PRILOSEC) 40 MG capsule, TAKE 1 CAPSULE BY MOUTH EVERY DAY, Disp: 90 capsule, Rfl: 1 .  zolpidem  (AMBIEN) 10 MG tablet, TAKE 1 TABLET BY MOUTH EVERY NIGHT AT BEDTIME AS NEEDED FOR SLEEP, Disp: 30 tablet, Rfl: 2 .  atorvastatin (LIPITOR) 40 MG tablet, Take 1 tablet (40 mg total) by mouth daily. (Patient not taking: Reported on 07/25/2016), Disp: 90 tablet, Rfl: 1 .  diazepam (VALIUM) 5 MG tablet, TK 1 T PO ON AN EMPTY STOMACH 2 HRS PRIOR TO PROCEDURE. REPEAT IN 60 MINUTES IF NEEDED, Disp: , Rfl: 0 .  fentaNYL (DURAGESIC - DOSED MCG/HR) 100 MCG/HR, Place 1 patch (100 mcg total) onto the skin every 3 (three) days., Disp: 5 patch, Rfl: 0 .  fentaNYL (DURAGESIC - DOSED MCG/HR) 25 MCG/HR patch, Place 1 patch (25 mcg total) onto the skin every 3 (three) days., Disp: 4 patch, Rfl: 0 .  lidocaine (XYLOCAINE) 5 % ointment, Apply 1 application topically as needed. (Patient not taking: Reported on 07/25/2016), Disp: 150 g, Rfl: 0 .  oxyCODONE (ROXICODONE) 15 MG immediate release tablet, Take 1 tablet (15 mg total) by mouth every 3 (three)  hours as needed., Disp: 60 tablet, Rfl: 0:  No Known Allergies:   Family History  Problem Relation Age of Onset  . Varicose Veins Mother   . Cancer Father     lung  . Cancer Maternal Grandmother     breast  . Cancer Paternal Grandmother     brain  :   Social History   Social History  . Marital status: Married    Spouse name: N/A  . Number of children: 3  . Years of education: N/A   Occupational History  . unemployed    Social History Main Topics  . Smoking status: Former Smoker    Packs/day: 1.00    Years: 44.00    Types: Cigarettes    Quit date: 03/22/2012  . Smokeless tobacco: Never Used  . Alcohol use No  . Drug use: No  . Sexual activity: Yes   Other Topics Concern  . Not on file   Social History Narrative   Marital status: married x 12 years; second marriage.      Children: 3 children; 3 grandchildren; no gg      Lives: with wife      Employment:  Building control surveyor; Architect; 40 hours per week      Tobacco: quit in 3 years ago in 2013;  smoked 45 years      Alcohol:  None      Drugs:  None      Exercise: works hard every day      Seatbelt:  100%; no texting while driving       :  REVIEW OF SYSTEMS:  A 15 point review of systems is documented in the electronic medical record. This was obtained by the nursing staff. However, I reviewed this with the patient to discuss relevant findings and make appropriate changes.  Pertinent items noted in HPI and remainder of comprehensive ROS otherwise negative.   PHYSICAL EXAM:  Blood pressure (!) 140/93, pulse 78, temperature 97.5 F (36.4 C), temperature source Oral, resp. rate 16, height 5\' 11"  (1.803 m), weight 198 lb (89.8 kg), SpO2 100 %. The patient has normal affect and is appropriate throughout the encounter.  KPS = 80  100 - Normal; no complaints; no evidence of disease. 90   - Able to carry on normal activity; minor signs or symptoms of disease. 80   - Normal activity with effort; some signs or symptoms of disease. 61   - Cares for self; unable to carry on normal activity or to do active work. 60   - Requires occasional assistance, but is able to care for most of his personal needs. 50   - Requires considerable assistance and frequent medical care. 50   - Disabled; requires special care and assistance. 14   - Severely disabled; hospital admission is indicated although death not imminent. 49   - Very sick; hospital admission necessary; active supportive treatment necessary. 10   - Moribund; fatal processes progressing rapidly. 0     - Dead  Karnofsky DA, Abelmann [place], Craver LS and Burchenal [facility] [phone]) The use of the nitrogen mustards in the palliative treatment of carcinoma: with particular reference to bronchogenic carcinoma Cancer 1 634-56  LABORATORY DATA:  Lab Results  Component Value Date   WBC 11.9 (H) 07/18/2016   HGB 9.4 (L) 07/18/2016   HCT 28.6 (L) 07/18/2016   MCV 88.0 07/18/2016   PLT 316 07/18/2016   Lab Results  Component Value Date   NA 138  07/19/2016  K 3.2 (L) 07/19/2016   CL 104 07/19/2016   CO2 24 07/19/2016   Lab Results  Component Value Date   ALT 7 (L) 07/12/2016   AST 24 07/12/2016   ALKPHOS 111 07/12/2016   BILITOT 0.8 07/12/2016     RADIOGRAPHY: Ct Abdomen Pelvis Wo Contrast  Result Date: 07/12/2016 CLINICAL DATA:  60 year old male with acute renal failure, anuria and constipation for 7 days. Patient with bladder cancer, currently on chemotherapy. EXAM: CT ABDOMEN AND PELVIS WITHOUT CONTRAST TECHNIQUE: Multidetector CT imaging of the abdomen and pelvis was performed following the standard protocol without IV contrast. COMPARISON:  03/19/2016 FINDINGS: Please note that parenchymal abnormalities may be missed without intravenous contrast. Lower chest: No acute abnormality. Hepatobiliary: A right hepatic cyst is again noted. No other hepatic or gallbladder abnormalities identified. There is no evidence of biliary dilatation. Pancreas: Unremarkable Spleen: Unremarkable Adrenals/Urinary Tract: New moderate to severe bilateral hydroureteronephrosis to the bladder is present. This is caused by an enlarging bladder mass, now measuring 3.7 x 8.3 cm with new masses/adenopathy throughout the pelvis. A conglomerate mass posterior to the bladder measures 6.5 x 11.1 cm. The adrenal glands are unremarkable. Stomach/Bowel: No bowel obstruction or focal bowel wall thickening. Vascular/Lymphatic: Aortic atherosclerotic calcifications noted without aneurysm. New lymphadenopathy throughout the pelvis identified. Other: No free fluid or pneumoperitoneum. Musculoskeletal: No acute bony abnormality. IMPRESSION: New moderate to severe bilateral hydronephrosis from significantly enlarged bladder mass/malignancy and metastatic disease/lymphadenopathy within the pelvis. Abdominal aortic atherosclerosis. Electronically Signed   By: Margarette Canada M.D.   On: 07/12/2016 19:21   Ir Nephrostomy Placement Left  Result Date: 07/13/2016 INDICATION: Rapidly  progressive bladder cancer, now with bilateral hydronephrosis secondary to obstructive uropathy with the uremia. Please perform ultrasound and fluoroscopic guided bilateral nephrostomy catheter placement. EXAM: 1. ULTRASOUND GUIDANCE FOR PUNCTURE OF THE BILATERAL RENAL COLLECTING SYSTEMS 2. BILATERAL PERCUTANEOUS NEPHROSTOMY TUBE PLACEMENT. COMPARISON:  CT abdomen pelvis - 07/12/2016; 03/19/2016 MEDICATIONS: Ciprofloxacin 400 mg IV; The antibiotic was administered in an appropriate time frame prior to skin puncture. ANESTHESIA/SEDATION: Fentanyl 100 mcg IV; Versed 4 mg IV Total Moderate Sedation Time 26 minutes. CONTRAST:  A total of 20 mL Isovue 300 was administered administered into the bilateral renal collecting systems FLUOROSCOPY TIME:  4 minutes 6 seconds (Q000111Q mGy) COMPLICATIONS: None immediate. PROCEDURE: The procedure, risks, benefits, and alternatives were explained to the patient's wife. Questions regarding the procedure were encouraged and answered. The patient's wife understands and consents to the procedure. A timeout was performed prior to the initiation of the procedure. The bilateral flanks were prepped with Betadine in a sterile fashion, and a sterile drape was applied covering the operative field. A sterile gown and sterile gloves were used for the procedure. Local anesthesia was provided with 1% Lidocaine with epinephrine. Beginning with the left kidney, under direct ultrasound guidance, a 21 gauge needle was advanced into the renal collecting system. An ultrasound image documentation was performed. Access within the collecting system was confirmed with the efflux of urine followed by contrast injection. Over a Nitrex wire, the track was dilated with an Accustick set. Over a guide wire, a 10-French percutaneous nephrostomy catheter was advanced into the collecting system where the coil was formed and locked. Contrast was injected and several sport radiographs were obtained in various obliquities  confirming access. The catheter was secured at the skin with a Prolene retention suture and a gravity bag was placed. Attention was now paid towards the right-sided nephrostomy. Again, a 21 gauge needle was advanced  into the renal collecting system. An ultrasound image documentation was performed. Access within the collecting system was confirmed with the efflux of urine followed by contrast injection. Over a Nitrex wire, the track was dilated with an Accustick set. Over a guide wire, a 10-French percutaneous nephrostomy catheter was advanced into the collecting system where the coil was formed and locked. Contrast was injected and several sport radiographs were obtained in various obliquities confirming access. The catheter was secured at the skin with a Prolene retention suture and a gravity bag was placed. Dressings were placed. The patient tolerated the procedure well without immediate postprocedural complication. FINDINGS: Ultrasound scanning demonstrates a mild to slightly moderately dilated collecting system. Under direct ultrasound guidance, a posterior inferior calix was targeted bilaterally allowing advancement of 10-French percutaneous nephrostomy catheters bilaterally under intermittent fluoroscopic guidance. Contrast injection confirmed appropriate positioning. IMPRESSION: Successful ultrasound and fluoroscopic guided placement of bilateral 10 French PCNs. Electronically Signed   By: Sandi Mariscal M.D.   On: 07/13/2016 08:08   Ir Nephrostomy Placement Right  Result Date: 07/13/2016 INDICATION: Rapidly progressive bladder cancer, now with bilateral hydronephrosis secondary to obstructive uropathy with the uremia. Please perform ultrasound and fluoroscopic guided bilateral nephrostomy catheter placement. EXAM: 1. ULTRASOUND GUIDANCE FOR PUNCTURE OF THE BILATERAL RENAL COLLECTING SYSTEMS 2. BILATERAL PERCUTANEOUS NEPHROSTOMY TUBE PLACEMENT. COMPARISON:  CT abdomen pelvis - 07/12/2016; 03/19/2016  MEDICATIONS: Ciprofloxacin 400 mg IV; The antibiotic was administered in an appropriate time frame prior to skin puncture. ANESTHESIA/SEDATION: Fentanyl 100 mcg IV; Versed 4 mg IV Total Moderate Sedation Time 26 minutes. CONTRAST:  A total of 20 mL Isovue 300 was administered administered into the bilateral renal collecting systems FLUOROSCOPY TIME:  4 minutes 6 seconds (Q000111Q mGy) COMPLICATIONS: None immediate. PROCEDURE: The procedure, risks, benefits, and alternatives were explained to the patient's wife. Questions regarding the procedure were encouraged and answered. The patient's wife understands and consents to the procedure. A timeout was performed prior to the initiation of the procedure. The bilateral flanks were prepped with Betadine in a sterile fashion, and a sterile drape was applied covering the operative field. A sterile gown and sterile gloves were used for the procedure. Local anesthesia was provided with 1% Lidocaine with epinephrine. Beginning with the left kidney, under direct ultrasound guidance, a 21 gauge needle was advanced into the renal collecting system. An ultrasound image documentation was performed. Access within the collecting system was confirmed with the efflux of urine followed by contrast injection. Over a Nitrex wire, the track was dilated with an Accustick set. Over a guide wire, a 10-French percutaneous nephrostomy catheter was advanced into the collecting system where the coil was formed and locked. Contrast was injected and several sport radiographs were obtained in various obliquities confirming access. The catheter was secured at the skin with a Prolene retention suture and a gravity bag was placed. Attention was now paid towards the right-sided nephrostomy. Again, a 21 gauge needle was advanced into the renal collecting system. An ultrasound image documentation was performed. Access within the collecting system was confirmed with the efflux of urine followed by contrast  injection. Over a Nitrex wire, the track was dilated with an Accustick set. Over a guide wire, a 10-French percutaneous nephrostomy catheter was advanced into the collecting system where the coil was formed and locked. Contrast was injected and several sport radiographs were obtained in various obliquities confirming access. The catheter was secured at the skin with a Prolene retention suture and a gravity bag was placed. Dressings were placed.  The patient tolerated the procedure well without immediate postprocedural complication. FINDINGS: Ultrasound scanning demonstrates a mild to slightly moderately dilated collecting system. Under direct ultrasound guidance, a posterior inferior calix was targeted bilaterally allowing advancement of 10-French percutaneous nephrostomy catheters bilaterally under intermittent fluoroscopic guidance. Contrast injection confirmed appropriate positioning. IMPRESSION: Successful ultrasound and fluoroscopic guided placement of bilateral 10 French PCNs. Electronically Signed   By: Sandi Mariscal M.D.   On: 07/13/2016 08:08      IMPRESSION: This is a well appearing 60 year old gentleman with urothelial carcinoma of the bladder, Stage T4a, statue post bilateral percutaneous nephrostomy tube.  We discussed the risks, benefits, and side effects of radiation therapy, as well as the logistics of therapy for the patient's education.  Based on the patient's current state, I would like to proceed with CT Simulation today with the hope of beginning radiation treatments as soon as possible. We discussed planning for 6 weeks of radiation treatment, but that that plan may change as we monitor the patient's tolerance of radiation.  I would also recommend a chest CT scan for staging, for staging.  PLAN: The patient is enthusiastic about moving forward with radiation treatment. He is scheduled for CT Simulation today. A consent form was discussed, signed, and placed in the patient's  chart.  Today, I talked to the patient and family about the findings and work-up thus far.  We discussed the natural history of locally advanced bladder cancer and general treatment, highlighting the role of radiotherapy in the management.  We discussed the available radiation techniques, and focused on the details of logistics and delivery.  We reviewed the anticipated acute and late sequelae associated with radiation in this setting.  The patient was encouraged to ask questions that I answered to the best of my ability.  I filled out a patient counseling form during our discussion including treatment diagrams.  We retained a copy for our records.  The patient would like to proceed with radiation and will be scheduled for CT simulation.  I spent 40 minutes face to face with the patient and more than 50% of that time was spent in counseling and/or coordination of care.   ------------------------------------------------   Tyler Pita, MD [place] Director and Director of Stereotactic Radiosurgery Direct Dial: [phone]  Fax: [phone] [email or web address]  Skype  [email or web address]  This document serves as a record of services personally performed by Tyler Pita, MD. It was created on his behalf by Maryla Morrow, a trained medical scribe. The creation of this record is based on the scribe's personal observations and the provider's statements to them. This document has been checked and approved by the attending provider.

## 2016-07-25 NOTE — Progress Notes (Signed)
Hematology and Oncology Follow Up Visit  [name] [id number] [birth year] 60 y.o. 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   Prior Therapy: He underwent TURBT on 03/30/2016 as well as TURP and the left bladder wall biopsy.  He is status post chemotherapy with gemcitabine and cisplatin completed on 06/12/2016.  Current therapy:  He is under evaluation for salvage radiation therapy   Interim History: Mr. Alexander Wolf presents today for a follow-up visit. Since the last visit, he was hospitalized acutely on 07/12/2016 after presenting with acute renal failure. His evaluation including CT scan of the abdomen and pelvis which showed rapid progression of his bladder tumor despite being on systemic chemotherapy. He had percutaneous nephrostomy tube placement with his acute renal failure resolving at this time. Since his discharge, he continues to have pain issues although his pain has improved after starting fentanyl patch at 75 g. He is still requiring up to 25 mg of oxycodone every 3 hours for breakthrough. His mobility is limited and he is quite debilitated because of recent hospitalization. He denied any falls or syncope. His appetite has been reasonably maintained.  He does not report any headaches, blurry vision, syncope or seizures. He does not report any fevers, chills, sweats. He does not report any chest pain, palpitation, orthopnea or leg edema. He does not report any cough, wheezing or hemoptysis. He does not report any nausea, vomiting, abdominal pain, gross satiety, constipation or diarrhea. He does not report any frequency, urgency or hesitancy. He denied dysuria or hematuria. He does not report any lymphadenopathy or petechiae. He does not report any skeletal complaints. Remaining review of systems  unremarkable  Medications: I have reviewed the patient's current medications.  Current Outpatient Prescriptions  Medication Sig Dispense Refill  . atorvastatin (LIPITOR) 40 MG tablet Take 1 tablet (40 mg total) by mouth daily. (Patient not taking: Reported on 07/25/2016) 90 tablet 1  . diazepam (VALIUM) 5 MG tablet TK 1 T PO ON AN EMPTY STOMACH 2 HRS PRIOR TO PROCEDURE. REPEAT IN 60 MINUTES IF NEEDED  0  . docusate sodium (COLACE) 100 MG capsule Take 1 capsule (100 mg total) by mouth 2 (two) times daily as needed for mild constipation. (Patient taking differently: Take 100 mg by mouth 2 (two) times daily. ) 60 capsule 0  . DULoxetine (CYMBALTA) 30 MG capsule Take 1 capsule (30 mg total) by mouth daily. 30 capsule 0  . fentaNYL (DURAGESIC - DOSED MCG/HR) 100 MCG/HR Place 1 patch (100 mcg total) onto the skin every 3 (three) days. 5 patch 0  . fentaNYL (DURAGESIC - DOSED MCG/HR) 25 MCG/HR patch Place 1 patch (25 mcg total) onto the skin every 3 (three) days. 4 patch 0  . glipiZIDE (GLUCOTROL) 5 MG tablet TAKE 1 TABLET BY MOUTH TWICE DAILY BEFORE A MEAL 180 tablet 0  . lidocaine (XYLOCAINE) 5 % ointment Apply 1 application topically as needed. (Patient not taking: Reported on 07/25/2016) 150 g 0  . lidocaine-prilocaine (EMLA) cream Apply to port before chemotherapy. 30 g 0  . lisinopril (PRINIVIL,ZESTRIL) 20 MG tablet   1  . metoprolol (LOPRESSOR) 50 MG tablet TAKE 1/2 TABLET BY MOUTH TWICE DAILY 90 tablet 0  . omeprazole (PRILOSEC) 40 MG capsule TAKE 1 CAPSULE BY MOUTH EVERY DAY 90 capsule 1  . oxyCODONE (ROXICODONE) 15 MG immediate release tablet Take  1 tablet (15 mg total) by mouth every 3 (three) hours as needed. 60 tablet 0  . zolpidem (AMBIEN) 10 MG tablet TAKE 1 TABLET BY MOUTH EVERY NIGHT AT BEDTIME AS NEEDED FOR SLEEP 30 tablet 2   No current facility-administered medications for this visit.      Allergies: No Known Allergies  Past Medical History, Surgical history, Social history,  and Family History were reviewed and updated.   Physical Exam: Blood pressure 138/83, pulse 91, temperature 97.9 F (36.6 C), temperature source Oral, resp. rate 20, height 5\' 11"  (1.803 m), SpO2 100 %. ECOG: 1 General appearance: Chronically ill-appearing gentleman appeared without distress. Head: Normocephalic, without obvious abnormality no oral thrush noted. Neck: no adenopathy Lymph nodes: Cervical, supraclavicular, and axillary nodes normal. Heart:regular rate and rhythm, S1, S2 normal, no murmur, click, rub or gallop Lung:chest clear, no wheezing, rales, normal symmetric air entry Abdomin: soft, non-tender, without masses or organomegaly no rebound or guarding. EXT:no erythema, induration, or nodules   Lab Results: Lab Results  Component Value Date   WBC 11.9 (H) 07/18/2016   HGB 9.4 (L) 07/18/2016   HCT 28.6 (L) 07/18/2016   MCV 88.0 07/18/2016   PLT 316 07/18/2016     Chemistry      Component Value Date/Time   NA 138 07/19/2016 0600   NA 136 07/12/2016 1132   K 3.2 (L) 07/19/2016 0600   K 6.2 (HH) 07/12/2016 1132   CL 104 07/19/2016 0600   CO2 24 07/19/2016 0600   CO2 11 (L) 07/12/2016 1132   BUN 22 (H) 07/19/2016 0600   BUN 98.6 (H) 07/12/2016 1132   CREATININE 1.79 (H) 07/19/2016 0600   CREATININE 16.4 (HH) 07/12/2016 1132      Component Value Date/Time   CALCIUM 8.8 (L) 07/19/2016 0600   CALCIUM 10.0 07/12/2016 1132   ALKPHOS 111 07/12/2016 1757   ALKPHOS 158 (H) 07/12/2016 1132   AST 24 07/12/2016 1757   AST 24 07/12/2016 1132   ALT 7 (L) 07/12/2016 1757   ALT <6 07/12/2016 1132   BILITOT 0.8 07/12/2016 1757   BILITOT 0.51 07/12/2016 1132       EXAM: CT ABDOMEN AND PELVIS WITHOUT CONTRAST  TECHNIQUE: Multidetector CT imaging of the abdomen and pelvis was performed following the standard protocol without IV contrast.  COMPARISON:  03/19/2016  FINDINGS: Please note that parenchymal abnormalities may be missed without intravenous  contrast.  Lower chest: No acute abnormality.  Hepatobiliary: A right hepatic cyst is again noted. No other hepatic or gallbladder abnormalities identified. There is no evidence of biliary dilatation.  Pancreas: Unremarkable  Spleen: Unremarkable  Adrenals/Urinary Tract: New moderate to severe bilateral hydroureteronephrosis to the bladder is present. This is caused by an enlarging bladder mass, now measuring 3.7 x 8.3 cm with new masses/adenopathy throughout the pelvis. A conglomerate mass posterior to the bladder measures 6.5 x 11.1 cm.  The adrenal glands are unremarkable.  Stomach/Bowel: No bowel obstruction or focal bowel wall thickening.  Vascular/Lymphatic: Aortic atherosclerotic calcifications noted without aneurysm. New lymphadenopathy throughout the pelvis identified.  Other: No free fluid or pneumoperitoneum.  Musculoskeletal: No acute bony abnormality.  IMPRESSION: New moderate to severe bilateral hydronephrosis from significantly enlarged bladder mass/malignancy and metastatic disease/lymphadenopathy within the pelvis.  Abdominal aortic atherosclerosis.    Impression and Plan:  59 year old gentleman with the following issues:  1. Urothelial carcinoma arising from the bladder neck presented with a tumor measuring 1.5 cm with a previous history of superficial bladder tumors. His tumor  invades into the prostatic stroma without any evidence of local lymphadenopathy or distantl metastasis. His pathological staging was T4a, NX.  He is S/P neoadjuvant chemotherapy utilizing cisplatin and gemcitabine. He completed the third cycle on 06/12/2016. His CT scan obtained on 07/12/2016 showed rapid progression of disease.  His case was discussed in the GU tumor board and curative options do not exist at this time. He would be a reasonable candidate for palliative radiation therapy initially and subsequently palliative systemic therapy. Immunotherapy in the  form of Pembrolizumab would be an option to start in the near future after shrinking his tumor with radiation therapy. I anticipate start that in January 2018. He understands his prognosis is rather poor and no surgical or curative option exist at this time.   2. IV access:  Port-A-Cath will be flushed with the next visit.  3. Antiemetics: Compazine is available to the patient. He had little nausea and vomiting noted.  4. Renal function surveillance: His creatinine has improved down to 1.79 after nephrostomy tube placement. Urine output remains reasonable.  5. Pain: Reasonably maintained on fentanyl at 75 mcg/hr although requiring high doses of breakthrough. I have increased his fentanyl to 100 g an hour and refilled his breakthrough oxycodone 15 mg every 3 hours.  6. Follow-up: Will be in anterior to 3 weeks to repeat laboratory testing and Port-A-Cath flush.   [facility], MD 12/13/201712:26 PM

## 2016-07-25 NOTE — Progress Notes (Addendum)
  Radiation Oncology         [phone] ________________________________  Name: Alexander Wolf MRN: [id number]  Date: 07/25/2016  DOB: 08/12/1956  SIMULATION AND TREATMENT PLANNING NOTE    ICD-9-CM ICD-10-CM   1. Malignant neoplasm of posterior wall of urinary bladder (HCC) 188.4 C67.4     DIAGNOSIS:  60 year old gentleman with urothelial carcinoma of the bladder, initially stage T4a, now with progression of pelvic involvement  NARRATIVE:  The patient was brought to the [facility].  Identity was confirmed.  All relevant records and images related to the planned course of therapy were reviewed.  The patient freely provided informed written consent to proceed with treatment after reviewing the details related to the planned course of therapy. The consent form was witnessed and verified by the simulation staff.  Then, the patient was set-up in a stable reproducible  supine position for radiation therapy.  CT images were obtained.  Surface markings were placed.  The CT images were loaded into the planning software.  Then the target and avoidance structures were contoured.  Treatment planning then occurred.  The radiation prescription was entered and confirmed.  Then, I designed and supervised the construction of a total of 5 medically necessary complex treatment devices with BodyFix holder and 4 MLCs.  I have requested : 3D Simulation  I have requested a DVH of the following structures: bladder, rectum, left hip, right hip and target.   PLAN:  The patient will receive 64.8 Gy in 36 fractions.  ________________________________  Sheral Apley Tammi Klippel, M.D.

## 2016-07-25 NOTE — Progress Notes (Signed)
GU Location of Tumor / Histology: invasive high grade urothelial carcinoma involving prostate tissue  Patient underwent TURBT on 03/30/16 as well as TURP and left bladder wall biopsy    Past/Anticipated interventions by urology, if any: TURP, biopsy, percutaneous nephrostomy tubes  Past/Anticipated interventions by medical oncology, if any: received 3 cycles of neoadjuvant chemotherapy completed on 06/12/2016.  Weight changes, if any: 30 lb last six month  Bowel/Bladder complaints, if any: posterior bladder tumor makes patient "feels constipated even when he isn't." Reports that he either has diarrhea or no movement at all. Clear yellow urine from right stent. Cloudy yellow urine from left stent. Wife reports patient hasn't been referred to physician to manage nephrostomy tubes.  Nausea/Vomiting, if any: yes  Pain issues, if any:  Bilateral lower leg pain related to effects of neuropathy  SAFETY ISSUES:  Prior radiation? no  Pacemaker/ICD? no  Possible current pregnancy? no  Is the patient on methotrexate? no  Current Complaints / other details:  60 year old male. Scheduled to see Shadad today at 1215 to discuss chemotherapy.CT scan obtained on 07/12/2016 showed rapidly enlarging tumor with a bladder mass measuring 3.7 x 8.3 cm with no masses in adenopathy throughout the pelvis. There is also a mass posterior to the bladder measuring 6.5 x 11.1 cm

## 2016-07-25 NOTE — Progress Notes (Signed)
See progress note under physician encounter. 

## 2016-07-25 NOTE — Telephone Encounter (Signed)
Called patient to inform of Ct for 07-26-16 - arrival time - 10:45 am, clear liquids only 4 hrs. Prior to test @ [facility], spoke with patient's wife, Benjamine Mola and she is aware of this test.

## 2016-07-26 ENCOUNTER — Ambulatory Visit (HOSPITAL_COMMUNITY)
Admission: RE | Admit: 2016-07-26 | Discharge: 2016-07-26 | Disposition: A | Discharge status: Home / Self Care | Payer: 59 | Source: Ambulatory Visit | Attending: Radiation Oncology | Admitting: Radiation Oncology

## 2016-07-26 ENCOUNTER — Ambulatory Visit
Admission: RE | Admit: 2016-07-26 | Discharge: 2016-07-26 | Disposition: A | Discharge status: Home / Self Care | Payer: 59 | Source: Ambulatory Visit | Attending: Radiation Oncology | Admitting: Radiation Oncology

## 2016-07-26 ENCOUNTER — Encounter (HOSPITAL_COMMUNITY): Payer: Self-pay

## 2016-07-26 DIAGNOSIS — N281 Cyst of kidney, acquired: Secondary | ICD-10-CM | POA: Diagnosis not present

## 2016-07-26 DIAGNOSIS — R911 Solitary pulmonary nodule: Secondary | ICD-10-CM | POA: Diagnosis not present

## 2016-07-26 DIAGNOSIS — I2510 Atherosclerotic heart disease of native coronary artery without angina pectoris: Secondary | ICD-10-CM | POA: Diagnosis not present

## 2016-07-26 DIAGNOSIS — C679 Malignant neoplasm of bladder, unspecified: Secondary | ICD-10-CM | POA: Diagnosis present

## 2016-07-26 DIAGNOSIS — Z8551 Personal history of malignant neoplasm of bladder: Secondary | ICD-10-CM

## 2016-07-26 MED ORDER — IOPAMIDOL (ISOVUE-300) INJECTION 61%
INTRAVENOUS | Status: AC
  Filled 2016-07-26: qty 75

## 2016-07-26 MED ORDER — HEPARIN SOD (PORK) LOCK FLUSH 100 UNIT/ML IV SOLN
INTRAVENOUS | Status: AC
  Filled 2016-07-26: qty 5

## 2016-07-26 MED ORDER — IOPAMIDOL (ISOVUE-300) INJECTION 61%
75.0000 mL | Freq: Once | INTRAVENOUS | Status: AC | PRN
  Administered 2016-07-26: 60 mL via INTRAVENOUS

## 2016-07-26 MED ORDER — SODIUM CHLORIDE 0.9 % IJ SOLN
INTRAMUSCULAR | Status: AC
  Filled 2016-07-26: qty 50

## 2016-07-27 ENCOUNTER — Ambulatory Visit
Admission: RE | Admit: 2016-07-27 | Discharge: 2016-07-27 | Disposition: A | Discharge status: Home / Self Care | Payer: 59 | Source: Ambulatory Visit | Attending: Radiation Oncology | Admitting: Radiation Oncology

## 2016-07-27 ENCOUNTER — Ambulatory Visit: Payer: 59 | Admitting: Oncology

## 2016-07-27 ENCOUNTER — Encounter: Payer: Self-pay | Admitting: Radiation Oncology

## 2016-07-27 VITALS — BP 138/68 | HR 103 | Temp 98.0°F | Resp 20

## 2016-07-27 DIAGNOSIS — C674 Malignant neoplasm of posterior wall of bladder: Secondary | ICD-10-CM

## 2016-07-27 MED ORDER — MORPHINE SULFATE 10 MG/ML IJ SOLN
8.0000 mg | Freq: Once | INTRAMUSCULAR | Status: AC
  Administered 2016-07-27: 8 mg via INTRAMUSCULAR
  Filled 2016-07-27: qty 1

## 2016-07-27 NOTE — Progress Notes (Signed)
Patient lying in dressing room moaning, and crying, pain 10/10, has 153mcg fentanyl patch on, took 30mg  oxycodone and valium 5mg  po 1 hour ago, called Dr.Manning after speaking with Dr. Isidore Moos,  Wife lynn stated ativan makes him go wild at times, , Dr.Manning gave verbal order  For 8mg  IM morphine x 1 now, if that doesn't work will re-sim pati8ent today prone per MD, took vitals first, verifed waste morphine form 10mg  to 8mg  with Manfred Arch RN,  Vitals taken,  There were no vitals taken for this visit.  Spoke with wife and patient gave verbal permission to give 8mg  IM Morphine, gave IM Morphine 8mg  in left gluteal x 1 Informed Faith, RT therapist to wait 15-20 minutes to try patient lying supine, if he cannot tolerate, he needs to be re-simmed prone per Dr. Tammi Klippel 12:57 PM  rechecked patient's p[ain level, patient still lying on side in dressing room in the back , asked pain level, "It's tolerable right now, wouldn't give a number, still moaning with wife at side, placed an gown rolled in between patient's kness, patient stated"that helped, thank you 12:58 PM

## 2016-07-27 NOTE — Progress Notes (Signed)
Patient completed  radiation treatment, vitals retaken,. Patient  In w/c  Still moaning but less than before, gruff talking  with his wife , d/c home via w/c 1:24 PM

## 2016-07-27 NOTE — Progress Notes (Addendum)
Patient lying in dressing room moaning, and crying, pain 10/10, has 169mcg fentanyl patch on, took 30mg  oxycodone and valium 5mg  po 1 hour ago, called Dr.Manning after speaking with Dr. Isidore Moos,  Wife lynn stated ativan makes him go wild at times, , Dr.Manning gave verbal order  For 8mg  IM morphine x 1 now, if that doesn't work will re-sim pati8ent today prone per MD, took vitals first, verifed waste morphine form 10mg  to 8mg  with [employer],  Vitals taken,  BP 108/64 (BP Location: Left Arm, Patient Position: Lying right side, Cuff Size: Normal)   Pulse 99   Temp 98 F (36.7 C) (Oral)   Resp 80   Spoke with wife and patient gave verbal permission to give 8mg  IM Morphine, gave IM Morphine 8mg  in left gluteal x 1 Informed Faith, RT therapist to wait 15-20 minutes to try patient lying supine, if he cannot tolerate, he needs to be re-simmed prone per Dr. Tammi Klippel 12:45 PM

## 2016-07-30 ENCOUNTER — Ambulatory Visit
Admission: RE | Admit: 2016-07-30 | Discharge: 2016-07-30 | Disposition: A | Discharge status: Home / Self Care | Payer: 59 | Source: Ambulatory Visit | Attending: Radiation Oncology | Admitting: Radiation Oncology

## 2016-07-30 VITALS — BP 95/55 | HR 100 | Resp 18

## 2016-07-30 DIAGNOSIS — C674 Malignant neoplasm of posterior wall of bladder: Secondary | ICD-10-CM | POA: Diagnosis not present

## 2016-07-30 DIAGNOSIS — G893 Neoplasm related pain (acute) (chronic): Secondary | ICD-10-CM

## 2016-07-30 MED ORDER — MORPHINE SULFATE 4 MG/ML IJ SOLN
4.0000 mg | INTRAMUSCULAR | Status: AC
  Administered 2016-07-30: 4 mg via INTRAMUSCULAR
  Filled 2016-07-30: qty 1

## 2016-07-30 NOTE — Progress Notes (Signed)
Patient alert and oriented x 3. No distress noted. Reports low back pain has reduced to a 5 on a scale of 0-10.

## 2016-07-31 ENCOUNTER — Ambulatory Visit
Admission: RE | Admit: 2016-07-31 | Discharge: 2016-07-31 | Disposition: A | Discharge status: Home / Self Care | Payer: 59 | Source: Ambulatory Visit | Attending: Radiation Oncology | Admitting: Radiation Oncology

## 2016-07-31 VITALS — BP 96/65 | HR 96

## 2016-07-31 DIAGNOSIS — C674 Malignant neoplasm of posterior wall of bladder: Secondary | ICD-10-CM | POA: Diagnosis not present

## 2016-07-31 DIAGNOSIS — G893 Neoplasm related pain (acute) (chronic): Secondary | ICD-10-CM

## 2016-07-31 MED ORDER — MORPHINE SULFATE 4 MG/ML IJ SOLN
4.0000 mg | INTRAMUSCULAR | Status: AC
  Administered 2016-07-31: 4 mg via INTRAMUSCULAR
  Filled 2016-07-31: qty 1

## 2016-08-01 ENCOUNTER — Ambulatory Visit
Admission: RE | Admit: 2016-08-01 | Discharge: 2016-08-01 | Disposition: A | Discharge status: Home / Self Care | Payer: 59 | Source: Ambulatory Visit | Attending: Radiation Oncology | Admitting: Radiation Oncology

## 2016-08-01 DIAGNOSIS — C674 Malignant neoplasm of posterior wall of bladder: Secondary | ICD-10-CM | POA: Diagnosis not present

## 2016-08-02 ENCOUNTER — Telehealth: Payer: Self-pay | Admitting: *Deleted

## 2016-08-02 ENCOUNTER — Other Ambulatory Visit: Payer: Self-pay | Admitting: *Deleted

## 2016-08-02 ENCOUNTER — Ambulatory Visit
Admission: RE | Admit: 2016-08-02 | Discharge: 2016-08-02 | Disposition: A | Discharge status: Home / Self Care | Payer: 59 | Source: Ambulatory Visit | Attending: Radiation Oncology | Admitting: Radiation Oncology

## 2016-08-02 DIAGNOSIS — C674 Malignant neoplasm of posterior wall of bladder: Secondary | ICD-10-CM | POA: Diagnosis not present

## 2016-08-02 MED ORDER — OXYCODONE HCL 15 MG PO TABS: 15.0000 mg | ORAL_TABLET | ORAL | 0 refills | Status: DC | PRN

## 2016-08-02 MED FILL — fentaNYL 100 MCG/HR PT72: 100 | 15 days supply | Qty: 5 | Fill #0

## 2016-08-02 NOTE — Telephone Encounter (Signed)
Patient called requesting a refill of oxycodone. Not fully out yet, but will be out over the long weekend. Please call once prescription is ready.

## 2016-08-02 NOTE — Telephone Encounter (Signed)
Ok to refill 

## 2016-08-03 ENCOUNTER — Ambulatory Visit
Admission: RE | Admit: 2016-08-03 | Discharge: 2016-08-03 | Disposition: A | Discharge status: Home / Self Care | Payer: 59 | Source: Ambulatory Visit | Attending: Radiation Oncology | Admitting: Radiation Oncology

## 2016-08-03 VITALS — BP 110/70 | HR 91 | Temp 98.0°F | Resp 16 | Wt 191.8 lb

## 2016-08-03 DIAGNOSIS — C674 Malignant neoplasm of posterior wall of bladder: Secondary | ICD-10-CM

## 2016-08-03 NOTE — Progress Notes (Signed)
  Radiation Oncology         [phone]   Name: Alexander Wolf MRN: [id number]   Date: 08/03/2016  DOB: 1956-01-22   Weekly Radiation Therapy Management    ICD-9-CM ICD-10-CM   1. Malignant neoplasm of posterior wall of urinary bladder (HCC) 188.4 C67.4     Current Dose: 10.8 Gy  Planned Dose:  45 Gy  Narrative The patient presents for routine under treatment assessment.  Vitals stable. Seven pound weight loss noted. Reports low back pain has improved. Continues to manage pain with Fentanyl 100 and 1-2 tablets of Oxy 15 every 3 hours. Per nursing, stents patent with cloudy drainage noted in left bag. Reports he hasn't had a bowel movement in two weeks. Nursing discussed fleets enema, magnesium citrate, and forcing fluids with electrolytes. Has not required premeds for xrt in two day. Wife reports that patient has hyperpigmented skin with breakdown. Reports he has began to leak urine.  The patient is without complaint. Set-up films were reviewed. The chart was checked.  Physical Findings  weight is 191 lb 12.8 oz (87 kg). His oral temperature is 98 F (36.7 C). His blood pressure is 110/70 and his pulse is 91. His respiration is 16 and oxygen saturation is 100%. . Weight essentially stable.  No significant changes. In wheelchair.  Impression The patient is tolerating radiation.  Plan Continue treatment as planned.          Sheral Apley Tammi Klippel, M.D.  This document serves as a record of services personally performed by Tyler Pita, MD. It was created on his behalf by Maryla Morrow, a trained medical scribe. The creation of this record is based on the scribe's personal observations and the provider's statements to them. This document has been checked and approved by the attending provider.

## 2016-08-03 NOTE — Progress Notes (Signed)
Vitals stable. Seven pound weight loss noted. Reports low back pain has improved. Continues to manage pain with Fentanyl 100 and 1-2 tablets of Oxy 15 every 3 hours. Stents patent with cloudy drainage noted in left bag. Reports he hasn't had a bowel movement in two weeks. Discussed fleets enema, magnesium citrate, and forcing fluids with electrolytes. Has not required premeds for xrt in two day. Wife reports that patient has hyperpigmented skin with breakdown. Reports he has began to leak urine. Explained importance of keeping this skin of this area dry. Educated upon use of Biafine for broken skin. Wife and patient verbalized understanding.   BP 110/70 (BP Location: Right Arm, Patient Position: Sitting, Cuff Size: Normal)   Pulse 91   Temp 98 F (36.7 C) (Oral)   Resp 16   Wt 191 lb 12.8 oz (87 kg)   SpO2 100%   BMI 26.75 kg/m  Wt Readings from Last 3 Encounters:  08/03/16 191 lb 12.8 oz (87 kg)  07/25/16 198 lb (89.8 kg)  07/13/16 219 lb 9.3 oz (99.6 kg)

## 2016-08-07 ENCOUNTER — Ambulatory Visit: Payer: 59 | Admitting: Oncology

## 2016-08-07 ENCOUNTER — Ambulatory Visit
Admission: RE | Admit: 2016-08-07 | Discharge: 2016-08-07 | Disposition: A | Discharge status: Home / Self Care | Payer: 59 | Source: Ambulatory Visit | Attending: Radiation Oncology | Admitting: Radiation Oncology

## 2016-08-07 DIAGNOSIS — C674 Malignant neoplasm of posterior wall of bladder: Secondary | ICD-10-CM | POA: Diagnosis not present

## 2016-08-08 ENCOUNTER — Other Ambulatory Visit: Payer: Self-pay | Admitting: *Deleted

## 2016-08-08 ENCOUNTER — Other Ambulatory Visit: Payer: Self-pay | Admitting: Radiation Oncology

## 2016-08-08 ENCOUNTER — Telehealth: Payer: Self-pay | Admitting: *Deleted

## 2016-08-08 ENCOUNTER — Ambulatory Visit
Admission: RE | Admit: 2016-08-08 | Discharge: 2016-08-08 | Disposition: A | Discharge status: Home / Self Care | Payer: 59 | Source: Ambulatory Visit | Attending: Radiation Oncology | Admitting: Radiation Oncology

## 2016-08-08 DIAGNOSIS — C674 Malignant neoplasm of posterior wall of bladder: Secondary | ICD-10-CM

## 2016-08-08 DIAGNOSIS — N1330 Unspecified hydronephrosis: Secondary | ICD-10-CM

## 2016-08-08 NOTE — Telephone Encounter (Signed)
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  He needs a referral to [facility] for nephrostomy tubes to be replaced when they're six weeks old.  They were placed in the ED around November 30 th so there is not a urologist or nephrologist involved.  Juliann Pulse said to go ahead and get him on the books because they fill up quickly."  [facility] [phone]."

## 2016-08-08 NOTE — Telephone Encounter (Signed)
No new note. 

## 2016-08-09 ENCOUNTER — Ambulatory Visit
Admission: RE | Admit: 2016-08-09 | Discharge: 2016-08-09 | Disposition: A | Discharge status: Home / Self Care | Payer: 59 | Source: Ambulatory Visit | Attending: Radiation Oncology | Admitting: Radiation Oncology

## 2016-08-09 DIAGNOSIS — C674 Malignant neoplasm of posterior wall of bladder: Secondary | ICD-10-CM | POA: Diagnosis not present

## 2016-08-10 ENCOUNTER — Ambulatory Visit
Admission: RE | Admit: 2016-08-10 | Discharge: 2016-08-10 | Disposition: A | Discharge status: Home / Self Care | Payer: 59 | Source: Ambulatory Visit | Attending: Radiation Oncology | Admitting: Radiation Oncology

## 2016-08-10 VITALS — BP 98/69 | HR 105 | Resp 18 | Wt 188.8 lb

## 2016-08-10 DIAGNOSIS — C674 Malignant neoplasm of posterior wall of bladder: Secondary | ICD-10-CM

## 2016-08-10 NOTE — Progress Notes (Addendum)
Vitals stable. Weight loss noted. Since the start of radiation therapy patient has been deconditioned. Offered PT referral again today but, patient declined. Reports new onset intermittent neck pain that began over the weekend. Reports pain is worse when he rising from a lying position. Reports urinary leakage. Reports penile skin irritation has resolved with the aid of Biafine. Drainage bag from left stent with cloudy urine and sentiment. Right drainage bag with clear yellow urine. Patient scheduled to follow up with IR about stents on January 11th. Reports new onset bowel incontinence.   BP 98/69 (BP Location: Right Arm, Patient Position: Supine, Cuff Size: Normal)   Pulse (!) 105   Resp 18   Wt 188 lb 12.8 oz (85.6 kg)   SpO2 99%   BMI 26.33 kg/m  Wt Readings from Last 3 Encounters:  08/10/16 188 lb 12.8 oz (85.6 kg)  08/03/16 191 lb 12.8 oz (87 kg)  07/25/16 198 lb (89.8 kg)

## 2016-08-10 NOTE — Progress Notes (Signed)
  Radiation Oncology         [phone]   Name: Alexander Wolf MRN: [id number]   Date: 08/10/2016  DOB: 1955/11/22   Weekly Radiation Therapy Management    ICD-9-CM ICD-10-CM   1. Malignant neoplasm of posterior wall of urinary bladder (HCC) 188.4 C67.4     Current Dose: 18 Gy  Planned Dose:  45 Gy  Narrative The patient presents for routine under treatment assessment.  Weight and vitals stable. Reports new onset intermittent neck pain that began over the weekend. Reports pain is worse when he rising from a lying position. Reports urinary leakage. Reports penile skin irritation has resolved with the aid of Biafine. Drainage bag from left stent with cloudy urine. Right drainage bag with clear yellow urine. Patient scheduled to follow up with IR about stents on January 11th. Reports new onset bowel incontinence. He is accompanied by his wife today.  The patient is without complaint. Set-up films were reviewed. The chart was checked.  Physical Findings  weight is 188 lb 12.8 oz (85.6 kg). His blood pressure is 98/69 and his pulse is 105 (abnormal). His respiration is 18 and oxygen saturation is 99%.  Weight essentially stable.  No significant changes. In wheelchair.  Impression The patient is tolerating radiation.  Plan Continue treatment as planned.         ------------------------------------------------  Jodelle Gross, MD, PhD  This document serves as a record of services personally performed by Kyung Rudd, MD. It was created on his behalf by Maryla Morrow, a trained medical scribe. The creation of this record is based on the scribe's personal observations and the provider's statements to them. This document has been checked and approved by the attending provider.

## 2016-08-14 ENCOUNTER — Encounter: Payer: Self-pay | Admitting: Radiation Oncology

## 2016-08-14 ENCOUNTER — Ambulatory Visit
Admission: RE | Admit: 2016-08-14 | Discharge: 2016-08-14 | Disposition: A | Discharge status: Home / Self Care | Payer: 59 | Source: Ambulatory Visit | Attending: Radiation Oncology | Admitting: Radiation Oncology

## 2016-08-14 ENCOUNTER — Other Ambulatory Visit: Payer: Self-pay | Admitting: *Deleted

## 2016-08-14 DIAGNOSIS — C674 Malignant neoplasm of posterior wall of bladder: Secondary | ICD-10-CM | POA: Diagnosis not present

## 2016-08-14 MED ORDER — FENTANYL 100 MCG/HR TD PT72
100.0000 ug | MEDICATED_PATCH | TRANSDERMAL | 0 refills | Status: DC
Start: 2016-08-14 — End: 2016-08-14

## 2016-08-14 MED ORDER — DULOXETINE HCL 30 MG PO CPEP: 30.0000 mg | ORAL_CAPSULE | Freq: Every day | ORAL | 1 refills | Status: AC

## 2016-08-14 MED ORDER — FENTANYL 100 MCG/HR TD PT72: 100.0000 ug | MEDICATED_PATCH | TRANSDERMAL | 0 refills | Status: DC

## 2016-08-14 MED FILL — DULoxetine HCL 30 MG CPEP: 30 | 30 days supply | Qty: 30 | Fill #0

## 2016-08-14 NOTE — Progress Notes (Signed)
Paperwork (matrix) received 08/10/16, given to nursing 08/14/16

## 2016-08-15 ENCOUNTER — Ambulatory Visit
Admission: RE | Admit: 2016-08-15 | Discharge: 2016-08-15 | Disposition: A | Discharge status: Home / Self Care | Payer: 59 | Source: Ambulatory Visit | Attending: Radiation Oncology | Admitting: Radiation Oncology

## 2016-08-15 ENCOUNTER — Encounter: Payer: Self-pay | Admitting: Oncology

## 2016-08-15 DIAGNOSIS — C674 Malignant neoplasm of posterior wall of bladder: Secondary | ICD-10-CM | POA: Diagnosis not present

## 2016-08-15 NOTE — Progress Notes (Signed)
Received PA request for Fentanyl patch. Called [facility](Patricia) to initiate auth. She states the message was refill too soon and it should go through on Fri 08/17/2016 which would be 15 days from last one filled on 08/02/17 to avoid auth.If written for more than 10 per 30 days auth is required  Called [facility] to advise(Camille). She states to call them back on Fri 08/17/16 to have them run again.

## 2016-08-16 ENCOUNTER — Other Ambulatory Visit: Payer: Self-pay | Admitting: Oncology

## 2016-08-16 ENCOUNTER — Ambulatory Visit
Admission: RE | Admit: 2016-08-16 | Discharge: 2016-08-16 | Disposition: A | Discharge status: Home / Self Care | Payer: 59 | Source: Ambulatory Visit | Attending: Radiation Oncology | Admitting: Radiation Oncology

## 2016-08-16 ENCOUNTER — Encounter: Payer: Self-pay | Admitting: *Deleted

## 2016-08-16 ENCOUNTER — Telehealth: Payer: Self-pay | Admitting: Radiation Oncology

## 2016-08-16 DIAGNOSIS — C674 Malignant neoplasm of posterior wall of bladder: Secondary | ICD-10-CM | POA: Diagnosis not present

## 2016-08-16 MED ORDER — OXYCODONE HCL 15 MG PO TABS: 15.0000 mg | ORAL_TABLET | ORAL | 0 refills | Status: DC | PRN

## 2016-08-16 MED FILL — oxyCODONE HCL 15 MG TABS: 15 | 8 days supply | Qty: 60 | Fill #0

## 2016-08-16 NOTE — Telephone Encounter (Signed)
Wife beth calling for refill on oxycodone 15 mg tablets, signed script left at front for p/u, beth notified.

## 2016-08-16 NOTE — Telephone Encounter (Signed)
Received message from patient's wife, Eustaquio Maize. She wanted to provide this RN with a "heads up" her husband isn't feeling well, has a headache and may need a shot (pain medication) prior to xrt. Returned call. Wife reports the patient proceeded with treatment today without incident and will see Dr. Tammi Klippel tomorrow following treatment. She states, "we now think he is just dehydrate." Encouraged fluids. Will plan to access patient tomorrow following radiation.

## 2016-08-17 ENCOUNTER — Ambulatory Visit
Admission: RE | Admit: 2016-08-17 | Discharge: 2016-08-17 | Disposition: A | Discharge status: Home / Self Care | Payer: 59 | Source: Ambulatory Visit | Attending: Radiation Oncology | Admitting: Radiation Oncology

## 2016-08-17 ENCOUNTER — Encounter: Payer: Self-pay | Admitting: Oncology

## 2016-08-17 ENCOUNTER — Telehealth: Payer: Self-pay | Admitting: *Deleted

## 2016-08-17 ENCOUNTER — Encounter: Payer: Self-pay | Admitting: *Deleted

## 2016-08-17 ENCOUNTER — Encounter: Payer: Self-pay | Admitting: Radiation Oncology

## 2016-08-17 VITALS — BP 127/84 | HR 97 | Resp 18 | Wt 181.6 lb

## 2016-08-17 DIAGNOSIS — C674 Malignant neoplasm of posterior wall of bladder: Secondary | ICD-10-CM | POA: Diagnosis not present

## 2016-08-17 MED FILL — fentaNYL 100 MCG/HR PT72: 100 | 15 days supply | Qty: 5 | Fill #0

## 2016-08-17 NOTE — Progress Notes (Signed)
Paperwork (matrix) received from doctor 1/5, faxed to [phone], confirmation received, copy given to patient

## 2016-08-17 NOTE — Progress Notes (Signed)
Vitals stable. Weight loss continues. Intermittent neck pain continues. Reports neck pain is less when laying flat. Reports new onset of headache when he wakes. Reports headaches have occurred for the last two days. Right urinary drainage bag with clear yellow urine. Left urinary drainage bag with cloudy yellow urine and sediment. Reports he continues to leak urine. Denies any bowel complaints. Still scheduled to follow up with IR about stents on 08/23/16.  BP 127/84 (BP Location: Right Arm, Patient Position: Supine, Cuff Size: Normal)   Pulse 97   Resp 18   Wt 181 lb 9.6 oz (82.4 kg)   SpO2 100%   BMI 25.33 kg/m  Wt Readings from Last 3 Encounters:  08/17/16 181 lb 9.6 oz (82.4 kg)  08/10/16 188 lb 12.8 oz (85.6 kg)  08/03/16 191 lb 12.8 oz (87 kg)

## 2016-08-17 NOTE — Telephone Encounter (Signed)
1. 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  Calling to let Dr. Alen Blew know this patient has received radiation for the past few days.  Has an 'excruciating headache' over the last few days from the back of his head coming up from the neck.  Wears fentanyl patch with oxycodone.  Used one of his wife's migraine pills which helped decrease headache to a seven on pain scale.  He did not give me the name of the pill.  No visual or B/P changes.  He also needs orders to renew two more weeks of visits as approved by insurance.  Call [phone] with phone orders. Okay to leave message on this confidential voicemail.  Next scheduled F/Iu is 08-21-2016 with Dr Alen Blew.

## 2016-08-17 NOTE — Progress Notes (Signed)
Called [facility] to attempt to have Fentanyl approved again. Rosendo Gros states it still shows an exceeded error message. Called [facility] back who states it does need an [place]. She started over the phone and faxed a form to be completed. Completed form with assistance of RN. Left for physician to sign. Received signed form back and faxed to [facility] along with office notes that were requested. Fax received ok per confirmation sheet.

## 2016-08-17 NOTE — Progress Notes (Signed)
  Radiation Oncology         [phone]   Name: KLINE BOWDER MRN: [id number]   Date: 08/17/2016  DOB: 24-Jul-1956   Weekly Radiation Therapy Management    ICD-9-CM ICD-10-CM   1. 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  Vitals stable. Weight loss continues. Intermittent neck pain continues. Reports neck pain is less when laying flat. Reports new onset of headache when he wakes. Reports headaches have occurred for the last two days. Right urinary drainage bag with clear yellow urine. Left urinary drainage bag with cloudy yellow urine and sediment. Reports he continues to leak urine. Denies any bowel complaints. Still scheduled to follow up with IR about stents on 08/23/16.  The patient is without complaint. Set-up films were reviewed. The chart was checked.  Physical Findings  weight is 181 lb 9.6 oz (82.4 kg). His blood pressure is 127/84 and his pulse is 97. His respiration is 18 and oxygen saturation is 100%. . Weight essentially stable.  No significant changes. In wheelchair.  Impression The patient is tolerating radiation.  Plan Continue treatment as planned. I discussed with the patient and his wife that we may order a brain CT scan in the near future to rule out brain metastasis, in light of his recent headaches.         Sheral Apley Tammi Klippel, M.D.  This document serves as a record of services personally performed by Tyler Pita, MD. It was created on his behalf by Maryla Morrow, a trained medical scribe. The creation of this record is based on the scribe's personal observations and the provider's statements to them. This document has been checked and approved by the attending provider.

## 2016-08-17 NOTE — Progress Notes (Signed)
Faxed FMLA paperwork to [employer] at [phone]

## 2016-08-20 ENCOUNTER — Ambulatory Visit
Admission: RE | Admit: 2016-08-20 | Discharge: 2016-08-20 | Disposition: A | Discharge status: Home / Self Care | Payer: 59 | Source: Ambulatory Visit | Attending: Radiation Oncology | Admitting: Radiation Oncology

## 2016-08-20 DIAGNOSIS — C674 Malignant neoplasm of posterior wall of bladder: Secondary | ICD-10-CM | POA: Diagnosis not present

## 2016-08-21 ENCOUNTER — Other Ambulatory Visit (HOSPITAL_BASED_OUTPATIENT_CLINIC_OR_DEPARTMENT_OTHER): Payer: 59

## 2016-08-21 ENCOUNTER — Ambulatory Visit: Payer: 59

## 2016-08-21 ENCOUNTER — Ambulatory Visit
Admission: RE | Admit: 2016-08-21 | Discharge: 2016-08-21 | Disposition: A | Discharge status: Home / Self Care | Payer: 59 | Source: Ambulatory Visit | Attending: Radiation Oncology | Admitting: Radiation Oncology

## 2016-08-21 ENCOUNTER — Ambulatory Visit (HOSPITAL_BASED_OUTPATIENT_CLINIC_OR_DEPARTMENT_OTHER): Payer: 59 | Admitting: Oncology

## 2016-08-21 ENCOUNTER — Ambulatory Visit (HOSPITAL_BASED_OUTPATIENT_CLINIC_OR_DEPARTMENT_OTHER): Payer: 59 | Admitting: Nurse Practitioner

## 2016-08-21 VITALS — BP 111/64 | HR 99 | Temp 99.1°F

## 2016-08-21 VITALS — BP 99/58 | HR 106 | Temp 97.9°F | Resp 17 | Ht 71.0 in

## 2016-08-21 DIAGNOSIS — C7989 Secondary malignant neoplasm of other specified sites: Secondary | ICD-10-CM

## 2016-08-21 DIAGNOSIS — C674 Malignant neoplasm of posterior wall of bladder: Secondary | ICD-10-CM

## 2016-08-21 DIAGNOSIS — C675 Malignant neoplasm of bladder neck: Secondary | ICD-10-CM

## 2016-08-21 DIAGNOSIS — C799 Secondary malignant neoplasm of unspecified site: Secondary | ICD-10-CM

## 2016-08-21 DIAGNOSIS — E860 Dehydration: Secondary | ICD-10-CM | POA: Diagnosis not present

## 2016-08-21 DIAGNOSIS — R52 Pain, unspecified: Secondary | ICD-10-CM

## 2016-08-21 DIAGNOSIS — Z95828 Presence of other vascular implants and grafts: Secondary | ICD-10-CM

## 2016-08-21 DIAGNOSIS — C679 Malignant neoplasm of bladder, unspecified: Secondary | ICD-10-CM

## 2016-08-21 LAB — COMPREHENSIVE METABOLIC PANEL
ALBUMIN: 2.7 g/dL — AB (ref 3.5–5.0)
ALK PHOS: 194 U/L — AB (ref 40–150)
ALT: 7 U/L (ref 0–55)
AST: 20 U/L (ref 5–34)
Anion Gap: 18 mEq/L — ABNORMAL HIGH (ref 3–11)
BUN: 15.9 mg/dL (ref 7.0–26.0)
CALCIUM: 11 mg/dL — AB (ref 8.4–10.4)
CHLORIDE: 95 meq/L — AB (ref 98–109)
CO2: 21 meq/L — AB (ref 22–29)
Creatinine: 1.3 mg/dL (ref 0.7–1.3)
EGFR: 60 mL/min/{1.73_m2} — AB (ref >90)
GLUCOSE: 225 mg/dL — AB (ref 70–140)
POTASSIUM: 3.8 meq/L (ref 3.5–5.1)
Sodium: 134 mEq/L — ABNORMAL LOW (ref 136–145)
Total Bilirubin: 0.59 mg/dL (ref 0.20–1.20)
Total Protein: 7.6 g/dL (ref 6.4–8.3)

## 2016-08-21 LAB — CBC WITH DIFFERENTIAL/PLATELET
BASO%: 0.1 % (ref 0.0–2.0)
Basophils Absolute: 0 10*3/uL (ref 0.0–0.1)
EOS ABS: 0.1 10*3/uL (ref 0.0–0.5)
EOS%: 0.7 % (ref 0.0–7.0)
HEMATOCRIT: 31.5 % — AB (ref 38.4–49.9)
HGB: 10.3 g/dL — ABNORMAL LOW (ref 13.0–17.1)
LYMPH#: 1 10*3/uL (ref 0.9–3.3)
LYMPH%: 10.8 % — AB (ref 14.0–49.0)
MCH: 28.3 pg (ref 27.2–33.4)
MCHC: 32.7 g/dL (ref 32.0–36.0)
MCV: 86.5 fL (ref 79.3–98.0)
MONO#: 0.8 10*3/uL (ref 0.1–0.9)
MONO%: 9 % (ref 0.0–14.0)
NEUT%: 79.4 % — ABNORMAL HIGH (ref 39.0–75.0)
NEUTROS ABS: 7.1 10*3/uL — AB (ref 1.5–6.5)
NRBC: 0 % (ref 0–0)
PLATELETS: 269 10*3/uL (ref 140–400)
RBC: 3.64 10*6/uL — ABNORMAL LOW (ref 4.20–5.82)
RDW: 15.1 % — AB (ref 11.0–14.6)
WBC: 8.9 10*3/uL (ref 4.0–10.3)

## 2016-08-21 MED ORDER — SODIUM CHLORIDE 0.9% FLUSH
10.0000 mL | INTRAVENOUS | Status: DC | PRN
  Filled 2016-08-21: qty 10

## 2016-08-21 MED ORDER — SODIUM CHLORIDE 0.9 % IV SOLN
Freq: Once | INTRAVENOUS | Status: AC
  Administered 2016-08-21: 15:00:00 via INTRAVENOUS

## 2016-08-21 MED ORDER — HYDROMORPHONE HCL 4 MG/ML IJ SOLN
2.0000 mg | INTRAMUSCULAR | Status: DC | PRN
  Administered 2016-08-21: 2 mg via INTRAVENOUS

## 2016-08-21 MED ORDER — HEPARIN SOD (PORK) LOCK FLUSH 100 UNIT/ML IV SOLN
500.0000 [IU] | Freq: Once | INTRAVENOUS | Status: DC | PRN
  Filled 2016-08-21: qty 5

## 2016-08-21 MED ORDER — HEPARIN SOD (PORK) LOCK FLUSH 100 UNIT/ML IV SOLN
500.0000 [IU] | Freq: Once | INTRAVENOUS | Status: AC | PRN
  Administered 2016-08-21: 500 [IU]
  Filled 2016-08-21: qty 5

## 2016-08-21 MED ORDER — ZOLEDRONIC ACID 4 MG/100ML IV SOLN
4.0000 mg | Freq: Once | INTRAVENOUS | Status: AC
  Administered 2016-08-21: 4 mg via INTRAVENOUS
  Filled 2016-08-21: qty 100

## 2016-08-21 MED ORDER — HYDROMORPHONE HCL 4 MG/ML IJ SOLN
2.0000 mg | Freq: Once | INTRAMUSCULAR | Status: AC
  Administered 2016-08-21: 2 mg via INTRAVENOUS

## 2016-08-21 MED ORDER — SODIUM CHLORIDE 0.9 % IJ SOLN
10.0000 mL | Freq: Once | INTRAMUSCULAR | Status: AC
  Administered 2016-08-21: 10 mL via INTRAVENOUS
  Filled 2016-08-21: qty 10

## 2016-08-21 MED ORDER — HYDROMORPHONE HCL 4 MG/ML IJ SOLN
INTRAMUSCULAR | Status: AC
  Filled 2016-08-21: qty 1

## 2016-08-21 MED ORDER — ALTEPLASE 2 MG IJ SOLR
2.0000 mg | Freq: Once | INTRAMUSCULAR | Status: DC | PRN
  Filled 2016-08-21: qty 2

## 2016-08-21 MED ORDER — SODIUM CHLORIDE 0.9% FLUSH
10.0000 mL | INTRAVENOUS | Status: DC | PRN
  Administered 2016-08-21: 10 mL
  Filled 2016-08-21: qty 10

## 2016-08-21 NOTE — Addendum Note (Signed)
Addended by: Wyatt Portela on: 08/21/2016 03:31 PM   Modules accepted: Orders

## 2016-08-21 NOTE — Patient Instructions (Signed)
Dehydration, Adult Dehydration is when there is not enough fluid or water in your body. This happens when you lose more fluids than you take in. Dehydration can range from mild to very bad. It should be treated right away to keep it from getting very bad. Symptoms of mild dehydration may include:   Thirst.  Dry lips.  Slightly dry mouth.  Dry, warm skin.  Dizziness. Symptoms of moderate dehydration may include:   Very dry mouth.  Muscle cramps.  Dark pee (urine). Pee may be the color of tea.  Your body making less pee.  Your eyes making fewer tears.  Heartbeat that is uneven or faster than normal (palpitations).  Headache.  Light-headedness, especially when you stand up from sitting.  Fainting (syncope). Symptoms of very bad dehydration may include:   Changes in skin, such as:  Cold and clammy skin.  Blotchy (mottled) or pale skin.  Skin that does not quickly return to normal after being lightly pinched and let go (poor skin turgor).  Changes in body fluids, such as:  Feeling very thirsty.  Your eyes making fewer tears.  Not sweating when body temperature is high, such as in hot weather.  Your body making very little pee.  Changes in vital signs, such as:  Weak pulse.  Pulse that is more than 100 beats a minute when you are sitting still.  Fast breathing.  Low blood pressure.  Other changes, such as:  Sunken eyes.  Cold hands and feet.  Confusion.  Lack of energy (lethargy).  Trouble waking up from sleep.  Short-term weight loss.  Unconsciousness. Follow these instructions at home:  If told by your doctor, drink an ORS:  Make an ORS by using instructions on the package.  Start by drinking small amounts, about  cup (120 mL) every 5-10 minutes.  Slowly drink more until you have had the amount that your doctor said to have.  Drink enough clear fluid to keep your pee clear or pale yellow. If you were told to drink an ORS, finish the  ORS first, then start slowly drinking clear fluids. Drink fluids such as:  Water. Do not drink only water by itself. Doing that can make the salt (sodium) level in your body get too low (hyponatremia).  Ice chips.  Fruit juice that you have added water to (diluted).  Low-calorie sports drinks.  Avoid:  Alcohol.  Drinks that have a lot of sugar. These include high-calorie sports drinks, fruit juice that does not have water added, and soda.  Caffeine.  Foods that are greasy or have a lot of fat or sugar.  Take over-the-counter and prescription medicines only as told by your doctor.  Do not take salt tablets. Doing that can make the salt level in your body get too high (hypernatremia).  Eat foods that have minerals (electrolytes). Examples include bananas, oranges, potatoes, tomatoes, and spinach.  Keep all follow-up visits as told by your doctor. This is important. Contact a doctor if:  You have belly (abdominal) pain that:  Gets worse.  Stays in one area (localizes).  You have a rash.  You have a stiff neck.  You get angry or annoyed more easily than normal (irritability).  You are more sleepy than normal.  You have a harder time waking up than normal.  You feel:  Weak.  Dizzy.  Very thirsty.  You have peed (urinated) only a small amount of very dark pee during 6-8 hours. Get help right away if:  You   have symptoms of very bad dehydration.  You cannot drink fluids without throwing up (vomiting).  Your symptoms get worse with treatment.  You have a fever.  You have a very bad headache.  You are throwing up or having watery poop (diarrhea) and it:  Gets worse.  Does not go away.  You have blood or something green (bile) in your throw-up.  You have blood in your poop (stool). This may cause poop to look black and tarry.  You have not peed in 6-8 hours.  You pass out (faint).  Your heart rate when you are sitting still is more than 100 beats a  minute.  You have trouble breathing. This information is not intended to replace advice given to you by your health care provider. Make sure you discuss any questions you have with your health care provider. Document Released: 05/26/2009 Document Revised: 02/17/2016 Document Reviewed: 09/23/2015 Elsevier Interactive Patient Education  2017 Elsevier Inc.  

## 2016-08-21 NOTE — Progress Notes (Addendum)
Hematology and Oncology Follow Up Visit  Alexander Wolf [id number] [birth year] 61 y.o. 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   Prior Therapy: He underwent TURBT on 03/30/2016 as well as TURP and the left bladder wall biopsy.  He is status post chemotherapy with gemcitabine and cisplatin completed on 06/12/2016.  Current therapy:  Radiation therapy to the bladder and pelvis for a total of 64 gray. Treatment is ongoing.   Interim History: Alexander Wolf presents today for a follow-up visit. Since the last visit, he currently receiving radiation therapy on a daily basis. He has been doing reasonably well but today he has been doing poorly. He reports that he overexert himself with activity and having excruciating amount of pain in his back around his percutaneous nephrostomy tubes. He also noted poor by mouth intake in the last 2 days.  He continues to be on fentanyl patch at 75 g. He is still requiring up to 25 mg of oxycodone every 3 hours for breakthrough. His mobility is limited but able to ambulate short distances. His pain is more excruciating with mobility.  He does not report any headaches, blurry vision, syncope or seizures. He does not report any fevers, chills, sweats. He does not report any chest pain, palpitation, orthopnea or leg edema. He does not report any cough, wheezing or hemoptysis. He does not report any nausea, vomiting, abdominal pain, gross satiety, constipation or diarrhea. He does not report any frequency, urgency or hesitancy. He denied dysuria or hematuria. He does not report any lymphadenopathy or petechiae. He does not report any skeletal complaints. Remaining review of systems unremarkable  Medications: I have reviewed the patient's current medications.  Current Outpatient Prescriptions   Medication Sig Dispense Refill  . DULoxetine (CYMBALTA) 30 MG capsule Take 1 capsule (30 mg total) by mouth daily. 30 capsule 1  . fentaNYL (DURAGESIC - DOSED MCG/HR) 100 MCG/HR Place 1 patch (100 mcg total) onto the skin every 3 (three) days. 5 patch 0  . glipiZIDE (GLUCOTROL) 5 MG tablet TAKE 1 TABLET BY MOUTH TWICE DAILY BEFORE A MEAL 180 tablet 0  . lidocaine-prilocaine (EMLA) cream Apply to port before chemotherapy. 30 g 0  . lisinopril (PRINIVIL,ZESTRIL) 20 MG tablet   1  . metoprolol (LOPRESSOR) 50 MG tablet TAKE 1/2 TABLET BY MOUTH TWICE DAILY 90 tablet 0  . omeprazole (PRILOSEC) 40 MG capsule TAKE 1 CAPSULE BY MOUTH EVERY DAY 90 capsule 1  . oxyCODONE (ROXICODONE) 15 MG immediate release tablet Take 1 tablet (15 mg total) by mouth every 3 (three) hours as needed. 60 tablet 0  . zolpidem (AMBIEN) 10 MG tablet TAKE 1 TABLET BY MOUTH EVERY NIGHT AT BEDTIME AS NEEDED FOR SLEEP 30 tablet 2  . atorvastatin (LIPITOR) 40 MG tablet Take 1 tablet (40 mg total) by mouth daily. (Patient not taking: Reported on 08/21/2016) 90 tablet 1  . diazepam (VALIUM) 5 MG tablet TK 1 T PO ON AN EMPTY STOMACH 2 HRS PRIOR TO PROCEDURE. REPEAT IN 60 MINUTES IF NEEDED  0  . docusate sodium (COLACE) 100 MG capsule Take 1 capsule (100 mg total) by mouth 2 (two) times daily as needed for mild constipation. (Patient not taking: Reported on 08/21/2016) 60 capsule 0  . lidocaine (XYLOCAINE) 5 % ointment Apply 1 application topically as needed. (Patient not taking: Reported on 08/21/2016) 150  g 0   No current facility-administered medications for this visit.      Allergies: No Known Allergies  Past Medical History, Surgical history, Social history, and Family History were reviewed and updated.   Physical Exam: Blood pressure (!) 99/58, pulse (!) 106, temperature 97.9 F (36.6 C), temperature source Oral, resp. rate 17, height 5\' 11"  (1.803 m), SpO2 100 %. ECOG: 1 General appearance: Chronically ill-appearing gentleman  appeared In distress. He was diaphoretic. Head: Normocephalic, without obvious abnormality. Oral mucosa appear dry. Neck: no adenopathy Lymph nodes: Cervical, supraclavicular, and axillary nodes normal. Heart:regular rate and rhythm, S1, S2 normal, no murmur, click, rub or gallop Lung:chest clear, no wheezing, rales, normal symmetric air entry Abdomin: soft, non-tender, without masses or organomegaly no rebound or guarding. EXT:no erythema, induration, or nodules   Lab Results: Lab Results  Component Value Date   WBC 8.9 08/21/2016   HGB 10.3 (L) 08/21/2016   HCT 31.5 (L) 08/21/2016   MCV 86.5 08/21/2016   PLT 269 08/21/2016     Chemistry      Component Value Date/Time   NA 138 07/19/2016 0600   NA 136 07/12/2016 1132   K 3.2 (L) 07/19/2016 0600   K 6.2 (HH) 07/12/2016 1132   CL 104 07/19/2016 0600   CO2 24 07/19/2016 0600   CO2 11 (L) 07/12/2016 1132   BUN 22 (H) 07/19/2016 0600   BUN 98.6 (H) 07/12/2016 1132   CREATININE 1.79 (H) 07/19/2016 0600   CREATININE 16.4 (HH) 07/12/2016 1132      Component Value Date/Time   CALCIUM 8.8 (L) 07/19/2016 0600   CALCIUM 10.0 07/12/2016 1132   ALKPHOS 111 07/12/2016 1757   ALKPHOS 158 (H) 07/12/2016 1132   AST 24 07/12/2016 1757   AST 24 07/12/2016 1132   ALT 7 (L) 07/12/2016 1757   ALT <6 07/12/2016 1132   BILITOT 0.8 07/12/2016 1757   BILITOT 0.51 07/12/2016 1132         Impression and Plan:  61 year old gentleman with the following issues:  1. Urothelial carcinoma arising from the bladder neck presented with a tumor measuring 1.5 cm with a previous history of superficial bladder tumors. His tumor invades into the prostatic stroma without any evidence of local lymphadenopathy or distantl metastasis. His pathological staging was T4a, NX.  He is S/P neoadjuvant chemotherapy utilizing cisplatin and gemcitabine. He completed the third cycle on 06/12/2016. His CT scan obtained on 07/12/2016 showed rapid progression of  disease.  His currently receiving radiation therapy to the bladder and pelvis for palliative purposes. Systemic therapy in the form of Pembrolizumab may be the next step upon completing therapy.   2. IV access:  Port-A-Cath will be utilized today for blood draw as well as intravenous infusion.  3. Antiemetics: Compazine is available to the patient.   4. Renal function surveillance: His creatinine has improved down to 1.79 after nephrostomy tube placement. Urine output remains reasonable. His creatinine will be repeated today.  5. Pain: He is currently on fentanyl 100 g every 72 hours. We'll continue to refill that for him with breakthrough pain medication. He is having a acute pain exacerbation at this time and we will administer intravenous Dilaudid to relieve his pain crisis at this time. His fentanyl patch could be increased in the future if needed to.    6. Dehydration: He will receive intravenous hydration today and his electrolytes will be repeated.  7. Hypercalcemia: Which could be contributing to his symptoms of dehydration and diffuse pain.  Hydration will certainly help and we will plan a dose of Zometa to be repeated as needed.  8. Follow-up: Will be in 4 weeks to repeat laboratory testing and Port-A-Cath flush.   Zola Button, MD 1/9/20182:44 PM

## 2016-08-21 NOTE — Patient Instructions (Signed)
Dehydration, Adult Dehydration is a condition in which there is not enough fluid or water in the body. This happens when you lose more fluids than you take in. Important organs, such as the kidneys, brain, and heart, cannot function without a proper amount of fluids. Any loss of fluids from the body can lead to dehydration. Dehydration can range from mild to severe. This condition should be treated right away to prevent it from becoming severe. What are the causes? This condition may be caused by:  Vomiting.  Diarrhea.  Excessive sweating, such as from heat exposure or exercise.  Not drinking enough fluid, especially:  When ill.  While doing activity that requires a lot of energy.  Excessive urination.  Fever.  Infection.  Certain medicines, such as medicines that cause the body to lose excess fluid (diuretics).  Inability to access safe drinking water.  Reduced physical ability to get adequate water and food. What increases the risk? This condition is more likely to develop in people:  Who have a poorly controlled long-term (chronic) illness, such as diabetes, heart disease, or kidney disease.  Who are age 65 or older.  Who are disabled.  Who live in a place with high altitude.  Who play endurance sports. What are the signs or symptoms? Symptoms of mild dehydration may include:   Thirst.  Dry lips.  Slightly dry mouth.  Dry, warm skin.  Dizziness. Symptoms of moderate dehydration may include:   Very dry mouth.  Muscle cramps.  Dark urine. Urine may be the color of tea.  Decreased urine production.  Decreased tear production.  Heartbeat that is irregular or faster than normal (palpitations).  Headache.  Light-headedness, especially when you stand up from a sitting position.  Fainting (syncope). Symptoms of severe dehydration may include:   Changes in skin, such as:  Cold and clammy skin.  Blotchy (mottled) or pale skin.  Skin that does  not quickly return to normal after being lightly pinched and released (poor skin turgor).  Changes in body fluids, such as:  Extreme thirst.  No tear production.  Inability to sweat when body temperature is high, such as in hot weather.  Very little urine production.  Changes in vital signs, such as:  Weak pulse.  Pulse that is more than 100 beats a minute when sitting still.  Rapid breathing.  Low blood pressure.  Other changes, such as:  Sunken eyes.  Cold hands and feet.  Confusion.  Lack of energy (lethargy).  Difficulty waking up from sleep.  Short-term weight loss.  Unconsciousness. How is this diagnosed? This condition is diagnosed based on your symptoms and a physical exam. Blood and urine tests may be done to help confirm the diagnosis. How is this treated? Treatment for this condition depends on the severity. Mild or moderate dehydration can often be treated at home. Treatment should be started right away. Do not wait until dehydration becomes severe. Severe dehydration is an emergency and it needs to be treated in a hospital. Treatment for mild dehydration may include:   Drinking more fluids.  Replacing salts and minerals in your blood (electrolytes) that you may have lost. Treatment for moderate dehydration may include:   Drinking an oral rehydration solution (ORS). This is a drink that helps you replace fluids and electrolytes (rehydrate). It can be found at pharmacies and retail stores. Treatment for severe dehydration may include:   Receiving fluids through an IV tube.  Receiving an electrolyte solution through a feeding tube that is   passed through your nose and into your stomach (nasogastric tube, or NG tube).  Correcting any abnormalities in electrolytes.  Treating the underlying cause of dehydration. Follow these instructions at home:  If directed by your health care provider, drink an ORS:  Make an ORS by following instructions on the  package.  Start by drinking small amounts, about  cup (120 mL) every 5-10 minutes.  Slowly increase how much you drink until you have taken the amount recommended by your health care provider.  Drink enough clear fluid to keep your urine clear or pale yellow. If you were told to drink an ORS, finish the ORS first, then start slowly drinking other clear fluids. Drink fluids such as:  Water. Do not drink only water. Doing that can lead to having too little salt (sodium) in the body (hyponatremia).  Ice chips.  Fruit juice that you have added water to (diluted fruit juice).  Low-calorie sports drinks.  Avoid:  Alcohol.  Drinks that contain a lot of sugar. These include high-calorie sports drinks, fruit juice that is not diluted, and soda.  Caffeine.  Foods that are greasy or contain a lot of fat or sugar.  Take over-the-counter and prescription medicines only as told by your health care provider.  Do not take sodium tablets. This can lead to having too much sodium in the body (hypernatremia).  Eat foods that contain a healthy balance of electrolytes, such as bananas, oranges, potatoes, tomatoes, and spinach.  Keep all follow-up visits as told by your health care provider. This is important. Contact a health care provider if:  You have abdominal pain that:  Gets worse.  Stays in one area (localizes).  You have a rash.  You have a stiff neck.  You are more irritable than usual.  You are sleepier or more difficult to wake up than usual.  You feel weak or dizzy.  You feel very thirsty.  You have urinated only a small amount of very dark urine over 6-8 hours. Get help right away if:  You have symptoms of severe dehydration.  You cannot drink fluids without vomiting.  Your symptoms get worse with treatment.  You have a fever.  You have a severe headache.  You have vomiting or diarrhea that:  Gets worse.  Does not go away.  You have blood or green matter  (bile) in your vomit.  You have blood in your stool. This may cause stool to look black and tarry.  You have not urinated in 6-8 hours.  You faint.  Your heart rate while sitting still is over 100 beats a minute.  You have trouble breathing. This information is not intended to replace advice given to you by your health care provider. Make sure you discuss any questions you have with your health care provider. Document Released: 07/30/2005 Document Revised: 02/24/2016 Document Reviewed: 09/23/2015 Elsevier Interactive Patient Education  2017 Elsevier Inc.  

## 2016-08-22 ENCOUNTER — Encounter: Payer: Self-pay | Admitting: *Deleted

## 2016-08-22 ENCOUNTER — Encounter: Payer: Self-pay | Admitting: Oncology

## 2016-08-22 ENCOUNTER — Encounter: Payer: Self-pay | Admitting: Family Medicine

## 2016-08-22 ENCOUNTER — Other Ambulatory Visit: Payer: Self-pay | Admitting: Emergency Medicine

## 2016-08-22 ENCOUNTER — Telehealth: Payer: Self-pay

## 2016-08-22 ENCOUNTER — Ambulatory Visit
Admission: RE | Admit: 2016-08-22 | Discharge: 2016-08-22 | Disposition: A | Discharge status: Home / Self Care | Payer: 59 | Source: Ambulatory Visit | Attending: Radiation Oncology | Admitting: Radiation Oncology

## 2016-08-22 ENCOUNTER — Encounter: Payer: Self-pay | Admitting: Nurse Practitioner

## 2016-08-22 ENCOUNTER — Telehealth: Payer: Self-pay | Admitting: Oncology

## 2016-08-22 DIAGNOSIS — C674 Malignant neoplasm of posterior wall of bladder: Secondary | ICD-10-CM | POA: Diagnosis not present

## 2016-08-22 MED ORDER — BLOOD GLUCOSE MONITOR KIT: PACK | 0 refills | Status: AC

## 2016-08-22 MED ORDER — BLOOD GLUCOSE MONITOR KIT: PACK | 0 refills | Status: DC

## 2016-08-22 NOTE — Telephone Encounter (Signed)
See patient email and call this to [facility] for pt.

## 2016-08-22 NOTE — Telephone Encounter (Signed)
called to advise patient of appointment scheduled for 09/26/16 at 12:15pm

## 2016-08-22 NOTE — Progress Notes (Signed)
Faxed signed [facility] to [facility] [phone]

## 2016-08-22 NOTE — Progress Notes (Signed)
RN visit only. 

## 2016-08-22 NOTE — Progress Notes (Signed)
Received approval from [place] for Fentanyl. Called [facility] to advise. She states patient had already picked it up and paid for it.

## 2016-08-22 NOTE — Telephone Encounter (Signed)
We can't send a generic one through the EMR so would be very appreciated if you could just call in the above rx as pt requested. Disp 1 meter, 100 strips and lancets with 11 refills. Dx: type 2 DM. Thanks.

## 2016-08-23 ENCOUNTER — Other Ambulatory Visit (HOSPITAL_COMMUNITY): Payer: 59

## 2016-08-23 ENCOUNTER — Ambulatory Visit
Admission: RE | Admit: 2016-08-23 | Discharge: 2016-08-23 | Disposition: A | Discharge status: Home / Self Care | Payer: 59 | Source: Ambulatory Visit | Attending: Radiation Oncology | Admitting: Radiation Oncology

## 2016-08-23 DIAGNOSIS — C674 Malignant neoplasm of posterior wall of bladder: Secondary | ICD-10-CM | POA: Diagnosis not present

## 2016-08-23 NOTE — Telephone Encounter (Signed)
Diabetes kit called to [facility]

## 2016-08-24 ENCOUNTER — Ambulatory Visit: Admission: RE | Admit: 2016-08-24 | Payer: 59 | Source: Ambulatory Visit | Admitting: Radiation Oncology

## 2016-08-24 ENCOUNTER — Ambulatory Visit: Payer: 59

## 2016-08-24 ENCOUNTER — Telehealth: Payer: Self-pay | Admitting: Radiation Oncology

## 2016-08-24 MED FILL — ONE TOUCH ULTRA TEST STRIPS: 30 days supply | Qty: 50 | Fill #0

## 2016-08-24 MED FILL — ONE TOUCH DELICA 33G LANCET: 30 days supply | Qty: 100 | Fill #0

## 2016-08-24 MED FILL — ONE TOUCH ULTRA 2 GLUCOSE S: W/DEVICE | 1 days supply | Qty: 1 | Fill #0

## 2016-08-24 NOTE — Telephone Encounter (Signed)
Patient's wife, Alexander Wolf, left message requesting return call. Phoned her back. She questions if her husband's treatments are palliative or curative. Also, she questions if she should call in Hospice. I explained the fact that his tumor invades into the prostatic stroma indicates he has T4a disease. Explained treatments are palliative. Explained no time frames have been detailed in the chart by any provider thus, this RN saw no need to call in [facility] at this time. Additionally, wife confirms she is able to care for him solely at this time. Questioned if discussions have been had about wishes for end of life and she confirms they have not. Offered social work, palliative care, or spiritual care referral to initiate those conversations. Wife doesn't want to at this time but, commits to let this RN know when that changes.

## 2016-08-24 NOTE — Telephone Encounter (Signed)
He has advanced disease which is not likely to be curable.  However, so far, we have no evidence of distant disease. So, his prognosis depends on whether progressive/metastatic disease develops and depends on his response to radiation.  His prognosis is guarded.  I would recommend palliative care involvement now, re-stage after radiation and reconsider options/hospice/treatment at that time.

## 2016-08-27 ENCOUNTER — Other Ambulatory Visit: Payer: Self-pay | Admitting: *Deleted

## 2016-08-27 ENCOUNTER — Encounter: Payer: Self-pay | Admitting: Radiation Oncology

## 2016-08-27 ENCOUNTER — Ambulatory Visit
Admission: RE | Admit: 2016-08-27 | Discharge: 2016-08-27 | Disposition: A | Discharge status: Home / Self Care | Payer: 59 | Source: Ambulatory Visit | Attending: Radiation Oncology | Admitting: Radiation Oncology

## 2016-08-27 VITALS — BP 128/85 | HR 108 | Temp 97.7°F | Resp 18 | Ht 71.0 in | Wt 174.8 lb

## 2016-08-27 DIAGNOSIS — C674 Malignant neoplasm of posterior wall of bladder: Secondary | ICD-10-CM | POA: Diagnosis not present

## 2016-08-27 DIAGNOSIS — C799 Secondary malignant neoplasm of unspecified site: Secondary | ICD-10-CM

## 2016-08-27 MED ORDER — OXYCODONE HCL 15 MG PO TABS: 15.0000 mg | ORAL_TABLET | ORAL | 0 refills | Status: DC | PRN

## 2016-08-27 MED ORDER — FENTANYL 100 MCG/HR TD PT72: 100.0000 ug | MEDICATED_PATCH | TRANSDERMAL | 0 refills | Status: AC

## 2016-08-27 MED ORDER — FENTANYL 25 MCG/HR TD PT72: 25.0000 ug | MEDICATED_PATCH | TRANSDERMAL | 0 refills | Status: AC

## 2016-08-27 NOTE — Progress Notes (Signed)
Vitals stable. Weight loss continues. Intermittent neck pain continues and back pain 6/10 taking Oxycodone. Reports neck pain is less when laying flat. Reports new onset of headache when he wakes. Right urinary drainage bag with clear yellow urine. Left urinary drainage bag with cloudy yellow urine and sediment. Reports he continues to leak urine.  Reports loose bowel movements 4-5 small per day. Still scheduled to follow up with IR about stents on 08/23/16 was over booked rescheduled for 08-28-16 Tuesday.   Wt Readings from Last 3 Encounters:  08/27/16 174 lb 12.8 oz (79.3 kg)  08/17/16 181 lb 9.6 oz (82.4 kg)  08/10/16 188 lb 12.8 oz (85.6 kg)  Weight loss of 7 pounds  3.4 oz  Unable to stand for blood pressure check due to back pain BP 128/85   Pulse (!) 108   Temp 97.7 F (36.5 C) (Oral)   Resp 18   Ht 5\' 11"  (1.803 m)   Wt 174 lb 12.8 oz (79.3 kg)   SpO2 100%   BMI 24.38 kg/m

## 2016-08-27 NOTE — Telephone Encounter (Signed)
Wife elizabeth calling for a refill on oxycodone 15 mg IR tablets #60. Script to dr Hazeline Junker desk for [employer]

## 2016-08-28 ENCOUNTER — Ambulatory Visit (HOSPITAL_COMMUNITY)
Admission: RE | Admit: 2016-08-28 | Discharge: 2016-08-28 | Disposition: A | Discharge status: Home / Self Care | Payer: 59 | Source: Ambulatory Visit | Attending: Oncology | Admitting: Oncology

## 2016-08-28 ENCOUNTER — Other Ambulatory Visit: Payer: Self-pay | Admitting: Oncology

## 2016-08-28 ENCOUNTER — Encounter (HOSPITAL_COMMUNITY): Payer: Self-pay | Admitting: Interventional Radiology

## 2016-08-28 ENCOUNTER — Ambulatory Visit
Admission: RE | Admit: 2016-08-28 | Discharge: 2016-08-28 | Disposition: A | Discharge status: Home / Self Care | Payer: 59 | Source: Ambulatory Visit | Attending: Radiation Oncology | Admitting: Radiation Oncology

## 2016-08-28 DIAGNOSIS — Z436 Encounter for attention to other artificial openings of urinary tract: Secondary | ICD-10-CM | POA: Insufficient documentation

## 2016-08-28 DIAGNOSIS — N1330 Unspecified hydronephrosis: Secondary | ICD-10-CM

## 2016-08-28 DIAGNOSIS — R32 Unspecified urinary incontinence: Secondary | ICD-10-CM | POA: Insufficient documentation

## 2016-08-28 DIAGNOSIS — Z8551 Personal history of malignant neoplasm of bladder: Secondary | ICD-10-CM | POA: Insufficient documentation

## 2016-08-28 DIAGNOSIS — C674 Malignant neoplasm of posterior wall of bladder: Secondary | ICD-10-CM | POA: Diagnosis not present

## 2016-08-28 HISTORY — PX: IR GENERIC HISTORICAL: IMG1180011

## 2016-08-28 MED ORDER — LIDOCAINE HCL 1 % IJ SOLN
INTRAMUSCULAR | Status: DC | PRN
  Administered 2016-08-28: 15 mL

## 2016-08-28 MED ORDER — IOPAMIDOL (ISOVUE-300) INJECTION 61%
50.0000 mL | Freq: Once | INTRAVENOUS | Status: AC | PRN
  Administered 2016-08-28: 25 mL via INTRAVENOUS

## 2016-08-28 MED ORDER — IOPAMIDOL (ISOVUE-300) INJECTION 61%
INTRAVENOUS | Status: AC
  Administered 2016-08-28: 25 mL via INTRAVENOUS
  Filled 2016-08-28: qty 50

## 2016-08-28 MED FILL — fentaNYL 25 MCG/HR PT72: 25 | 15 days supply | Qty: 5 | Fill #0

## 2016-08-28 MED FILL — oxyCODONE HCL 15 MG TABS: 15 | 8 days supply | Qty: 60 | Fill #0

## 2016-08-28 NOTE — Procedures (Signed)
Successful bilateral PCN exchange.   EBL: None No immediate complications.   Note, as the patient is incontinent, the pt's wife wishes to maintain the bilateral PCNs to gravity bags in lieu of conversion to either nephroureteral catheters or bilateral double J stents.   Ronny Bacon, MD Pager #: [phone]

## 2016-08-29 ENCOUNTER — Ambulatory Visit: Payer: 59

## 2016-08-29 ENCOUNTER — Telehealth: Payer: Self-pay | Admitting: *Deleted

## 2016-08-29 NOTE — Telephone Encounter (Signed)
Called patient to inform of CT for 08-31-16- arrival time - 2:45 pm @ [facility], pt. To be NPO - 4 hrs. Prior to test, spoke with patient's wife Benjamine Mola and she is aware of these tests.

## 2016-08-30 ENCOUNTER — Ambulatory Visit: Payer: 59 | Admitting: Radiation Oncology

## 2016-08-30 ENCOUNTER — Ambulatory Visit: Payer: 59

## 2016-08-30 MED FILL — fentaNYL 100 MCG/HR PT72: 100 | 15 days supply | Qty: 5 | Fill #0

## 2016-08-31 ENCOUNTER — Ambulatory Visit (HOSPITAL_COMMUNITY): Payer: 59

## 2016-08-31 ENCOUNTER — Ambulatory Visit: Payer: 59

## 2016-08-31 ENCOUNTER — Emergency Department (HOSPITAL_COMMUNITY): Payer: 59

## 2016-08-31 ENCOUNTER — Encounter (HOSPITAL_COMMUNITY): Payer: Self-pay | Admitting: *Deleted

## 2016-08-31 ENCOUNTER — Ambulatory Visit: Payer: 59 | Admitting: Radiation Oncology

## 2016-08-31 ENCOUNTER — Inpatient Hospital Stay (HOSPITAL_COMMUNITY)
Admission: EM | Admit: 2016-08-31 | Discharge: 2016-09-04 | DRG: 871 | Disposition: A | Discharge status: Home Health | Payer: 59 | Attending: Internal Medicine | Admitting: Internal Medicine

## 2016-08-31 ENCOUNTER — Other Ambulatory Visit: Payer: Self-pay

## 2016-08-31 ENCOUNTER — Ambulatory Visit: Admission: RE | Admit: 2016-08-31 | Payer: 59 | Source: Ambulatory Visit | Admitting: Radiation Oncology

## 2016-08-31 DIAGNOSIS — R6520 Severe sepsis without septic shock: Secondary | ICD-10-CM | POA: Diagnosis present

## 2016-08-31 DIAGNOSIS — E876 Hypokalemia: Secondary | ICD-10-CM | POA: Diagnosis present

## 2016-08-31 DIAGNOSIS — K5900 Constipation, unspecified: Secondary | ICD-10-CM

## 2016-08-31 DIAGNOSIS — Z923 Personal history of irradiation: Secondary | ICD-10-CM

## 2016-08-31 DIAGNOSIS — E119 Type 2 diabetes mellitus without complications: Secondary | ICD-10-CM | POA: Diagnosis present

## 2016-08-31 DIAGNOSIS — Z66 Do not resuscitate: Secondary | ICD-10-CM | POA: Diagnosis present

## 2016-08-31 DIAGNOSIS — G893 Neoplasm related pain (acute) (chronic): Secondary | ICD-10-CM | POA: Diagnosis not present

## 2016-08-31 DIAGNOSIS — Z7984 Long term (current) use of oral hypoglycemic drugs: Secondary | ICD-10-CM

## 2016-08-31 DIAGNOSIS — C674 Malignant neoplasm of posterior wall of bladder: Secondary | ICD-10-CM | POA: Diagnosis present

## 2016-08-31 DIAGNOSIS — K219 Gastro-esophageal reflux disease without esophagitis: Secondary | ICD-10-CM | POA: Diagnosis not present

## 2016-08-31 DIAGNOSIS — N390 Urinary tract infection, site not specified: Secondary | ICD-10-CM | POA: Diagnosis present

## 2016-08-31 DIAGNOSIS — C679 Malignant neoplasm of bladder, unspecified: Secondary | ICD-10-CM

## 2016-08-31 DIAGNOSIS — Z801 Family history of malignant neoplasm of trachea, bronchus and lung: Secondary | ICD-10-CM | POA: Diagnosis not present

## 2016-08-31 DIAGNOSIS — Z808 Family history of malignant neoplasm of other organs or systems: Secondary | ICD-10-CM | POA: Diagnosis not present

## 2016-08-31 DIAGNOSIS — Z7189 Other specified counseling: Secondary | ICD-10-CM | POA: Diagnosis not present

## 2016-08-31 DIAGNOSIS — F329 Major depressive disorder, single episode, unspecified: Secondary | ICD-10-CM | POA: Diagnosis present

## 2016-08-31 DIAGNOSIS — Z8551 Personal history of malignant neoplasm of bladder: Secondary | ICD-10-CM

## 2016-08-31 DIAGNOSIS — Z87891 Personal history of nicotine dependence: Secondary | ICD-10-CM | POA: Diagnosis not present

## 2016-08-31 DIAGNOSIS — A419 Sepsis, unspecified organism: Principal | ICD-10-CM | POA: Diagnosis present

## 2016-08-31 DIAGNOSIS — D638 Anemia in other chronic diseases classified elsewhere: Secondary | ICD-10-CM | POA: Diagnosis present

## 2016-08-31 DIAGNOSIS — Z79899 Other long term (current) drug therapy: Secondary | ICD-10-CM

## 2016-08-31 DIAGNOSIS — I2510 Atherosclerotic heart disease of native coronary artery without angina pectoris: Secondary | ICD-10-CM | POA: Diagnosis not present

## 2016-08-31 DIAGNOSIS — I10 Essential (primary) hypertension: Secondary | ICD-10-CM | POA: Diagnosis present

## 2016-08-31 DIAGNOSIS — C787 Secondary malignant neoplasm of liver and intrahepatic bile duct: Secondary | ICD-10-CM | POA: Diagnosis present

## 2016-08-31 DIAGNOSIS — Z936 Other artificial openings of urinary tract status: Secondary | ICD-10-CM

## 2016-08-31 DIAGNOSIS — E872 Acidosis: Secondary | ICD-10-CM | POA: Diagnosis present

## 2016-08-31 DIAGNOSIS — B9620 Unspecified Escherichia coli [E. coli] as the cause of diseases classified elsewhere: Secondary | ICD-10-CM | POA: Diagnosis present

## 2016-08-31 DIAGNOSIS — R7989 Other specified abnormal findings of blood chemistry: Secondary | ICD-10-CM | POA: Diagnosis present

## 2016-08-31 DIAGNOSIS — C7951 Secondary malignant neoplasm of bone: Secondary | ICD-10-CM | POA: Diagnosis not present

## 2016-08-31 DIAGNOSIS — N1330 Unspecified hydronephrosis: Secondary | ICD-10-CM | POA: Diagnosis not present

## 2016-08-31 DIAGNOSIS — R591 Generalized enlarged lymph nodes: Secondary | ICD-10-CM | POA: Diagnosis not present

## 2016-08-31 DIAGNOSIS — E785 Hyperlipidemia, unspecified: Secondary | ICD-10-CM | POA: Diagnosis present

## 2016-08-31 DIAGNOSIS — R079 Chest pain, unspecified: Secondary | ICD-10-CM | POA: Diagnosis not present

## 2016-08-31 DIAGNOSIS — Z951 Presence of aortocoronary bypass graft: Secondary | ICD-10-CM | POA: Diagnosis not present

## 2016-08-31 DIAGNOSIS — R197 Diarrhea, unspecified: Secondary | ICD-10-CM | POA: Diagnosis not present

## 2016-08-31 DIAGNOSIS — Z515 Encounter for palliative care: Secondary | ICD-10-CM | POA: Diagnosis not present

## 2016-08-31 DIAGNOSIS — C7989 Secondary malignant neoplasm of other specified sites: Secondary | ICD-10-CM | POA: Diagnosis not present

## 2016-08-31 DIAGNOSIS — N401 Enlarged prostate with lower urinary tract symptoms: Secondary | ICD-10-CM | POA: Diagnosis present

## 2016-08-31 DIAGNOSIS — I700 Atherosclerosis of aorta: Secondary | ICD-10-CM | POA: Diagnosis not present

## 2016-08-31 DIAGNOSIS — K5909 Other constipation: Secondary | ICD-10-CM

## 2016-08-31 DIAGNOSIS — R4182 Altered mental status, unspecified: Secondary | ICD-10-CM | POA: Diagnosis not present

## 2016-08-31 DIAGNOSIS — R338 Other retention of urine: Secondary | ICD-10-CM | POA: Diagnosis not present

## 2016-08-31 DIAGNOSIS — G92 Toxic encephalopathy: Secondary | ICD-10-CM | POA: Diagnosis present

## 2016-08-31 DIAGNOSIS — Z9221 Personal history of antineoplastic chemotherapy: Secondary | ICD-10-CM

## 2016-08-31 DIAGNOSIS — C675 Malignant neoplasm of bladder neck: Secondary | ICD-10-CM | POA: Diagnosis not present

## 2016-08-31 DIAGNOSIS — R32 Unspecified urinary incontinence: Secondary | ICD-10-CM | POA: Diagnosis present

## 2016-08-31 DIAGNOSIS — G4700 Insomnia, unspecified: Secondary | ICD-10-CM | POA: Diagnosis not present

## 2016-08-31 DIAGNOSIS — Z7401 Bed confinement status: Secondary | ICD-10-CM

## 2016-08-31 LAB — URINALYSIS, ROUTINE W REFLEX MICROSCOPIC
Bilirubin Urine: NEGATIVE
GLUCOSE, UA: NEGATIVE mg/dL
KETONES UR: 5 mg/dL — AB
NITRITE: NEGATIVE
PROTEIN: 100 mg/dL — AB
RBC / HPF: NONE SEEN RBC/hpf (ref 0–5)
Specific Gravity, Urine: 1.014 (ref 1.005–1.030)
Squamous Epithelial / LPF: NONE SEEN
pH: 5 (ref 5.0–8.0)

## 2016-08-31 LAB — GLUCOSE, CAPILLARY
GLUCOSE-CAPILLARY: 83 mg/dL (ref 65–99)
Glucose-Capillary: 85 mg/dL (ref 65–99)

## 2016-08-31 LAB — CREATININE, SERUM
Creatinine, Ser: 0.87 mg/dL (ref 0.61–1.24)
GFR calc non Af Amer: >60 mL/min (ref >60)

## 2016-08-31 LAB — I-STAT TROPONIN, ED: TROPONIN I, POC: 0 ng/mL (ref 0.00–0.08)

## 2016-08-31 LAB — CBC
HEMATOCRIT: 27 % — AB (ref 39.0–52.0)
HEMATOCRIT: 23.6 % — AB (ref 39.0–52.0)
HEMOGLOBIN: 9.1 g/dL — AB (ref 13.0–17.0)
HEMOGLOBIN: 7.6 g/dL — AB (ref 13.0–17.0)
MCH: 28.3 pg (ref 26.0–34.0)
MCH: 27.6 pg (ref 26.0–34.0)
MCHC: 33.7 g/dL (ref 30.0–36.0)
MCHC: 32.2 g/dL (ref 30.0–36.0)
MCV: 83.9 fL (ref 78.0–100.0)
MCV: 85.8 fL (ref 78.0–100.0)
Platelets: 122 10*3/uL — ABNORMAL LOW (ref 150–400)
Platelets: 95 10*3/uL — ABNORMAL LOW (ref 150–400)
RBC: 3.22 MIL/uL — ABNORMAL LOW (ref 4.22–5.81)
RBC: 2.75 MIL/uL — ABNORMAL LOW (ref 4.22–5.81)
RDW: 14.8 % (ref 11.5–15.5)
RDW: 15.1 % (ref 11.5–15.5)
WBC: 5.4 10*3/uL (ref 4.0–10.5)
WBC: 3.9 10*3/uL — ABNORMAL LOW (ref 4.0–10.5)

## 2016-08-31 LAB — BASIC METABOLIC PANEL
ANION GAP: 17 — AB (ref 5–15)
BUN: 16 mg/dL (ref 6–20)
CALCIUM: 6.7 mg/dL — AB (ref 8.9–10.3)
CO2: 20 mmol/L — AB (ref 22–32)
Chloride: 96 mmol/L — ABNORMAL LOW (ref 101–111)
Creatinine, Ser: 1.12 mg/dL (ref 0.61–1.24)
GFR calc Af Amer: >60 mL/min (ref >60)
GFR calc non Af Amer: >60 mL/min (ref >60)
GLUCOSE: 219 mg/dL — AB (ref 65–99)
Potassium: 3.2 mmol/L — ABNORMAL LOW (ref 3.5–5.1)
Sodium: 133 mmol/L — ABNORMAL LOW (ref 135–145)

## 2016-08-31 LAB — LACTIC ACID, PLASMA: Lactic Acid, Venous: 2.2 mmol/L (ref 0.5–1.9)

## 2016-08-31 LAB — HEPATIC FUNCTION PANEL
ALT: 13 U/L — AB (ref 17–63)
AST: 36 U/L (ref 15–41)
Albumin: 2.9 g/dL — ABNORMAL LOW (ref 3.5–5.0)
Alkaline Phosphatase: 277 U/L — ABNORMAL HIGH (ref 38–126)
BILIRUBIN DIRECT: 0.2 mg/dL (ref 0.1–0.5)
BILIRUBIN TOTAL: 0.6 mg/dL (ref 0.3–1.2)
Indirect Bilirubin: 0.4 mg/dL (ref 0.3–0.9)
Total Protein: 7.1 g/dL (ref 6.5–8.1)

## 2016-08-31 LAB — I-STAT CG4 LACTIC ACID, ED: Lactic Acid, Venous: 3.37 mmol/L (ref 0.5–1.9)

## 2016-08-31 MED ORDER — FENTANYL 100 MCG/HR TD PT72
100.0000 ug | MEDICATED_PATCH | TRANSDERMAL | Status: DC
  Administered 2016-09-03: 100 ug via TRANSDERMAL
  Filled 2016-08-31: qty 1

## 2016-08-31 MED ORDER — DOCUSATE SODIUM 100 MG PO CAPS: 100.0000 mg | ORAL_CAPSULE | Freq: Two times a day (BID) | ORAL | Status: DC | PRN

## 2016-08-31 MED ORDER — ACETAMINOPHEN 325 MG PO TABS: 650.0000 mg | ORAL_TABLET | Freq: Four times a day (QID) | ORAL | Status: DC | PRN

## 2016-08-31 MED ORDER — HYDROMORPHONE HCL 1 MG/ML IJ SOLN
0.5000 mg | Freq: Once | INTRAMUSCULAR | Status: AC
  Administered 2016-08-31: 0.5 mg via INTRAVENOUS
  Filled 2016-08-31: qty 1

## 2016-08-31 MED ORDER — MORPHINE SULFATE (PF) 2 MG/ML IV SOLN
1.0000 mg | INTRAVENOUS | Status: DC | PRN
  Administered 2016-09-01: 1 mg via INTRAVENOUS
  Filled 2016-08-31: qty 1

## 2016-08-31 MED ORDER — CEFEPIME HCL 2 G IJ SOLR: 2.0000 g | Freq: Two times a day (BID) | INTRAMUSCULAR | Status: DC

## 2016-08-31 MED ORDER — HEPARIN SODIUM (PORCINE) 5000 UNIT/ML IJ SOLN
5000.0000 [IU] | Freq: Three times a day (TID) | INTRAMUSCULAR | Status: DC
  Administered 2016-08-31 – 2016-09-04 (×12): 5000 [IU] via SUBCUTANEOUS
  Filled 2016-08-31 (×11): qty 1

## 2016-08-31 MED ORDER — METOPROLOL TARTRATE 25 MG PO TABS
25.0000 mg | ORAL_TABLET | Freq: Two times a day (BID) | ORAL | Status: DC
  Administered 2016-08-31 – 2016-09-04 (×8): 25 mg via ORAL
  Filled 2016-08-31 (×10): qty 1

## 2016-08-31 MED ORDER — SODIUM CHLORIDE 0.9% FLUSH
10.0000 mL | INTRAVENOUS | Status: DC | PRN
  Administered 2016-09-01: 10 mL
  Filled 2016-08-31: qty 40

## 2016-08-31 MED ORDER — SODIUM CHLORIDE 0.9% FLUSH
3.0000 mL | Freq: Two times a day (BID) | INTRAVENOUS | Status: DC
  Administered 2016-08-31 – 2016-09-02 (×3): 3 mL via INTRAVENOUS

## 2016-08-31 MED ORDER — ONDANSETRON HCL 4 MG PO TABS: 4.0000 mg | ORAL_TABLET | Freq: Four times a day (QID) | ORAL | Status: DC | PRN

## 2016-08-31 MED ORDER — DULOXETINE HCL 30 MG PO CPEP
30.0000 mg | ORAL_CAPSULE | Freq: Every day | ORAL | Status: DC
  Administered 2016-09-01 – 2016-09-04 (×4): 30 mg via ORAL
  Filled 2016-08-31 (×5): qty 1

## 2016-08-31 MED ORDER — SODIUM CHLORIDE 0.9 % IV BOLUS (SEPSIS)
1000.0000 mL | Freq: Once | INTRAVENOUS | Status: AC
  Administered 2016-08-31: 1000 mL via INTRAVENOUS

## 2016-08-31 MED ORDER — DEXTROSE 5 % IV SOLN
2.0000 g | INTRAVENOUS | Status: DC
  Administered 2016-09-01 – 2016-09-03 (×3): 2 g via INTRAVENOUS
  Filled 2016-08-31 (×3): qty 2

## 2016-08-31 MED ORDER — ONDANSETRON HCL 4 MG/2ML IJ SOLN: 4.0000 mg | Freq: Four times a day (QID) | INTRAMUSCULAR | Status: DC | PRN

## 2016-08-31 MED ORDER — ACETAMINOPHEN 650 MG RE SUPP: 650.0000 mg | Freq: Four times a day (QID) | RECTAL | Status: DC | PRN

## 2016-08-31 MED ORDER — IOPAMIDOL (ISOVUE-370) INJECTION 76%
INTRAVENOUS | Status: AC
  Filled 2016-08-31: qty 100

## 2016-08-31 MED ORDER — PANTOPRAZOLE SODIUM 40 MG PO TBEC
40.0000 mg | DELAYED_RELEASE_TABLET | Freq: Every day | ORAL | Status: DC
Start: 2016-09-01 — End: 2016-09-04
  Administered 2016-09-01 – 2016-09-04 (×4): 40 mg via ORAL
  Filled 2016-08-31 (×5): qty 1

## 2016-08-31 MED ORDER — OXYCODONE HCL 5 MG PO TABS
15.0000 mg | ORAL_TABLET | ORAL | Status: DC | PRN
Start: 2016-08-31 — End: 2016-09-01
  Administered 2016-08-31 – 2016-09-01 (×2): 15 mg via ORAL
  Filled 2016-08-31 (×2): qty 3

## 2016-08-31 MED ORDER — SODIUM CHLORIDE 0.9 % IV BOLUS (SEPSIS)
500.0000 mL | Freq: Once | INTRAVENOUS | Status: AC
  Administered 2016-08-31: 500 mL via INTRAVENOUS

## 2016-08-31 MED ORDER — POTASSIUM CHLORIDE CRYS ER 20 MEQ PO TBCR
40.0000 meq | EXTENDED_RELEASE_TABLET | Freq: Once | ORAL | Status: AC
  Administered 2016-08-31: 40 meq via ORAL
  Filled 2016-08-31: qty 2

## 2016-08-31 MED ORDER — ZOLPIDEM TARTRATE 10 MG PO TABS
10.0000 mg | ORAL_TABLET | Freq: Every evening | ORAL | Status: DC | PRN
  Administered 2016-08-31 – 2016-09-02 (×2): 10 mg via ORAL
  Filled 2016-08-31 (×2): qty 1

## 2016-08-31 MED ORDER — SODIUM CHLORIDE 0.9 % IV SOLN
INTRAVENOUS | Status: DC
  Administered 2016-08-31 – 2016-09-04 (×6): via INTRAVENOUS

## 2016-08-31 MED ORDER — DEXTROSE 5 % IV SOLN
2.0000 g | Freq: Once | INTRAVENOUS | Status: AC
  Administered 2016-08-31: 2 g via INTRAVENOUS
  Filled 2016-08-31: qty 2

## 2016-08-31 MED ORDER — IOPAMIDOL (ISOVUE-370) INJECTION 76%
100.0000 mL | Freq: Once | INTRAVENOUS | Status: AC | PRN
  Administered 2016-08-31: 100 mL via INTRAVENOUS

## 2016-08-31 MED ORDER — INSULIN ASPART 100 UNIT/ML ~~LOC~~ SOLN
0.0000 [IU] | Freq: Three times a day (TID) | SUBCUTANEOUS | Status: DC
  Administered 2016-09-02 – 2016-09-03 (×3): 1 [IU] via SUBCUTANEOUS

## 2016-08-31 NOTE — ED Provider Notes (Signed)
[facility] DEPT Provider Note   CSN: [id number] Arrival date & time: 08/31/16  1250     History   Chief Complaint Chief Complaint  Patient presents with  . Chest Pain  . Altered Mental Status    HPI Alexander Wolf is a 61 y.o. male.  HPI  61 year old male presents with chest pain and altered mental status. Patient has a history of bladder cancer with metastasis. Currently receiving radiation. Patient started complaining of chest pain this morning on and off. Wife states that he has also been altered. Both lethargic but also confused such as asking the wife not to get on her motorcycle which she has not own for 20 years. Has been having diaphoresis on and off today. Nauseated without vomiting. No complaints of dyspnea. Complains of all over pain which is a recurrent issue. 3 days ago was bumped up from 100 mcg fentanyl patch to 125 mcg. Had bilateral nephrostomy tube changes 3 days ago. Wife feels like his urine output is a little bit less and that the color is darker. Still urinates some by penis but it is incontinence.   Past Medical History:  Diagnosis Date  . Bladder cancer ([place]) 2014   [facility] in [place], [place]; /  urologist in [place]-  dr Diona Fanti; lost to f/u, redeveloped problems in 7/17  . BPH (benign prostatic hypertrophy) with urinary retention   . Coronary artery disease    s/p CABG  (per pt hasn't seen cardiologist for several years)  . Foley catheter in place   . GERD (gastroesophageal reflux disease)   . Hyperlipidemia   . Hypertension   . Insomnia   . 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 Type 2 diabetes mellitus [facility])     Patient Active Problem List   Diagnosis Date Noted  . Palliative care encounter   . Goals of care, counseling/discussion   . Neoplasm related pain   . Bilateral hydronephrosis   . Metastatic cancer ([place])   . Nonspecific abnormal electrocardiogram (ECG) (EKG) 07/13/2016  . Hyperkalemia 07/13/2016  . Elevated lactic acid level 07/13/2016    . Uremia 07/13/2016  . Acute bilateral obstructive uropathy 07/13/2016  . Anemia of chronic disease 07/13/2016  . Hypocalcemia 07/13/2016  . Acute renal failure ([place]) 07/12/2016  . Port catheter in place 06/05/2016  . Constipation 05/30/2016  . Malignant neoplasm of posterior wall of urinary bladder ([place]) 04/10/2016  . Nodular prostate with urinary obstruction 03/30/2016  . BPH (benign prostatic hypertrophy) with urinary retention 03/29/2016  . Coronary artery disease involving coronary bypass graft of native heart without angina pectoris 04/04/2014  . Essential hypertension 04/04/2014  . Hyperlipidemia LDL goal <70 04/04/2014  . Type 2 diabetes mellitus not at goal [facility]) 04/04/2014  . Hx of bladder cancer 04/04/2014    Past Surgical History:  Procedure Laterality Date  . CARDIAC CATHETERIZATION  10/26/2003  dr Leonia Reeves   ETT + ischemia/  severe 3 vessel avcad,  normal LVF, ef 55-60%  . CORONARY ARTERY BYPASS GRAFT  10-28-2003   dr Ricard Dillon   LIMA to dLAD,  RIMA to OM1,  SVG to OM2,  SVG to  right posterolateral   . CYSTOSCOPY N/A 03/29/2016   Procedure: CYSTOSCOPY;  Surgeon: Franchot Gallo, MD;  Location: [facility];  Service: Urology;  Laterality: N/A;  . IR GENERIC HISTORICAL  04/19/2016   IR US GUIDE VASC ACCESS RIGHT 04/19/2016 Corrie Mckusick, DO [facility]  . IR GENERIC HISTORICAL  04/19/2016  IR FLUORO GUIDE PORT INSERTION RIGHT 04/19/2016 Corrie Mckusick, DO [facility]  . IR GENERIC HISTORICAL  07/12/2016   IR NEPHROSTOMY PLACEMENT RIGHT 07/12/2016 Sandi Mariscal, MD [facility]  . IR GENERIC HISTORICAL  07/12/2016   IR NEPHROSTOMY PLACEMENT LEFT 07/12/2016 Sandi Mariscal, MD [facility]  . IR GENERIC HISTORICAL  08/28/2016   IR NEPHROSTOMY EXCHANGE LEFT 08/28/2016 Sandi Mariscal, MD [facility]  . IR GENERIC HISTORICAL  08/28/2016   IR NEPHROSTOMY EXCHANGE RIGHT 08/28/2016 Sandi Mariscal, MD [facility]  . TONSILLECTOMY  as child  . TRANSURETHRAL RESECTION OF BLADDER  TUMOR  2014  . TRANSURETHRAL RESECTION OF BLADDER TUMOR WITH GYRUS (TURBT-GYRUS) N/A 03/29/2016   Procedure: TRANSURETHRAL RESECTION OF BLADDER TUMOR WITH GYRUS (TURBT-GYRUS);  Surgeon: Franchot Gallo, MD;  Location: [facility];  Service: Urology;  Laterality: N/A;  . TRANSURETHRAL RESECTION OF PROSTATE N/A 03/29/2016   Procedure: TRANSURETHRAL RESECTION OF THE PROSTATE (TURP);  Surgeon: Franchot Gallo, MD;  Location: [facility];  Service: Urology;  Laterality: N/A;       Home Medications    Prior to Admission medications   Medication Sig Start Date End Date Taking? Authorizing Provider  docusate sodium (COLACE) 100 MG capsule Take 1 capsule (100 mg total) by mouth 2 (two) times daily as needed for mild constipation. 04/07/16  Yes Shawnee Knapp, MD  DULoxetine (CYMBALTA) 30 MG capsule Take 1 capsule (30 mg total) by mouth daily. 08/14/16  Yes Wyatt Portela, MD  fentaNYL (DURAGESIC - DOSED MCG/HR) 100 MCG/HR Place 1 patch (100 mcg total) onto the skin every 3 (three) days. 08/27/16  Yes Tyler Pita, MD  fentaNYL (DURAGESIC - DOSED MCG/HR) 25 MCG/HR patch Place 1 patch (25 mcg total) onto the skin every 3 (three) days. 08/27/16  Yes Tyler Pita, MD  glipiZIDE (GLUCOTROL) 5 MG tablet TAKE 1 TABLET BY MOUTH TWICE DAILY BEFORE A MEAL 07/15/16  Yes Shawnee Knapp, MD  lidocaine-prilocaine (EMLA) cream Apply to port before chemotherapy. 04/10/16  Yes Wyatt Portela, MD  lisinopril (PRINIVIL,ZESTRIL) 20 MG tablet  07/11/16  Yes Historical Provider, MD  metoprolol (LOPRESSOR) 50 MG tablet TAKE 1/2 TABLET BY MOUTH TWICE DAILY 06/13/16  Yes Shawnee Knapp, MD  omeprazole (PRILOSEC) 40 MG capsule TAKE 1 CAPSULE BY MOUTH EVERY DAY 03/29/16  Yes Wardell Honour, MD  oxyCODONE (ROXICODONE) 15 MG immediate release tablet Take 1 tablet (15 mg total) by mouth every 3 (three) hours as needed. 08/27/16  Yes Wyatt Portela, MD  zolpidem (AMBIEN) 10 MG tablet TAKE 1 TABLET BY MOUTH EVERY  NIGHT AT BEDTIME AS NEEDED FOR SLEEP 06/05/16  Yes Shawnee Knapp, MD  atorvastatin (LIPITOR) 40 MG tablet Take 1 tablet (40 mg total) by mouth daily. Patient not taking: Reported on 08/27/2016 03/29/16   Wardell Honour, MD  blood glucose meter kit and supplies KIT Dispense based on patient and insurance preference. Use up to four times daily as directed. (FOR ICD-9 250.00, 250.01). 08/22/16   Shawnee Knapp, MD  lidocaine (XYLOCAINE) 5 % ointment Apply 1 application topically as needed. Patient not taking: Reported on 08/31/2016 06/29/16   Shawnee Knapp, MD    Family History Family History  Problem Relation Age of Onset  . Varicose Veins Mother   . Cancer Father     lung  . Cancer Maternal Grandmother     breast  . Cancer Paternal Grandmother     brain    Social History  Social History  Substance Use Topics  . Smoking status: Former Smoker    Packs/day: 1.00    Years: 44.00    Types: Cigarettes    Quit date: 03/22/2012  . Smokeless tobacco: Never Used  . Alcohol use No     Allergies   Patient has no known allergies.   Review of Systems Review of Systems  Constitutional: Positive for diaphoresis. Negative for fever.  Respiratory: Negative for cough and shortness of breath.   Cardiovascular: Positive for chest pain.  Gastrointestinal: Negative for abdominal pain.  Genitourinary: Positive for decreased urine volume.  Musculoskeletal: Positive for arthralgias and back pain.  Psychiatric/Behavioral: Positive for confusion.  All other systems reviewed and are negative.    Physical Exam Updated Vital Signs BP 106/74 (BP Location: Left Arm)   Pulse 107   Temp 98.7 F (37.1 C) (Oral)   Resp 17   Ht '5\' 11"'  (1.803 m)   Wt 174 lb (78.9 kg)   SpO2 97%   BMI 24.27 kg/m   Physical Exam  Constitutional: He is oriented to person, place, and time. He appears well-developed and well-nourished.  Uncomfortable, appears in pain  HENT:  Head: Normocephalic and atraumatic.  Right Ear:  External ear normal.  Left Ear: External ear normal.  Nose: Nose normal.  Eyes: Right eye exhibits no discharge. Left eye exhibits no discharge.  Neck: Neck supple.  Cardiovascular: Regular rhythm and normal heart sounds.  Tachycardia present.   HR low 100s  Pulmonary/Chest: Effort normal and breath sounds normal. He exhibits tenderness (right anterior chest several cm inferior to chest port).  Abdominal: Soft. There is no tenderness.  Musculoskeletal: He exhibits no edema.  Neurological: He is alert and oriented to person, place, and time.  Sleepy but easily awakens and awakens on own. Alert and oriented x 4. Equal strength in all 4 extremities  Skin: Skin is warm. He is diaphoretic.  Nursing note and vitals reviewed.    ED Treatments / Results  Labs (all labs ordered are listed, but only abnormal results are displayed) Labs Reviewed  BASIC METABOLIC PANEL - Abnormal; Notable for the following:       Result Value   Sodium 133 (*)    Potassium 3.2 (*)    Chloride 96 (*)    CO2 20 (*)    Glucose, Bld 219 (*)    Calcium 6.7 (*)    Anion gap 17 (*)    All other components within normal limits  CBC - Abnormal; Notable for the following:    RBC 3.22 (*)    Hemoglobin 9.1 (*)    HCT 27.0 (*)    Platelets 122 (*)    All other components within normal limits  URINALYSIS, ROUTINE W REFLEX MICROSCOPIC - Abnormal; Notable for the following:    APPearance TURBID (*)    Hgb urine dipstick MODERATE (*)    Ketones, ur 5 (*)    Protein, ur 100 (*)    Leukocytes, UA MODERATE (*)    Bacteria, UA MANY (*)    All other components within normal limits  HEPATIC FUNCTION PANEL - Abnormal; Notable for the following:    Albumin 2.9 (*)    ALT 13 (*)    Alkaline Phosphatase 277 (*)    All other components within normal limits  I-STAT CG4 LACTIC ACID, ED - Abnormal; Notable for the following:    Lactic Acid, Venous 3.37 (*)    All other components within normal limits  CULTURE, BLOOD  (  ROUTINE X 2)  CULTURE, BLOOD (ROUTINE X 2)  URINE CULTURE  GLUCOSE, CAPILLARY  CBC  CREATININE, SERUM  CBC  COMPREHENSIVE METABOLIC PANEL  LACTIC ACID, PLASMA  LACTIC ACID, PLASMA  I-STAT TROPOININ, ED  I-STAT CG4 LACTIC ACID, ED    EKG  EKG Interpretation  Date/Time:  Friday August 31 2016 12:53:52 EST Ventricular Rate:  102 PR Interval:    QRS Duration: 102 QT Interval:  362 QTC Calculation: 472 R Axis:   23 Text Interpretation:  Sinus tachycardia Abnormal T, consider ischemia, lateral leads Baseline wander in lead(s) II aVR T wave changes similar to Jul 13 2016 Confirmed by Regenia Skeeter MD, Montray Kliebert [phone]) on 08/31/2016 1:00:34 PM       Radiology Dg Chest 2 View  Result Date: 08/31/2016 CLINICAL DATA:  61 year old male with chest pain and shortness of Breath starting today. EXAM: CHEST  2 VIEW COMPARISON:  Chest CT 07/26/2016.  Chest x-ray 11/22/2003 FINDINGS: Stable asymmetric elevation right hemidiaphragm. Right Port-A-Cath tip overlies the mid to distal SVC. Patient is status post CABG. Nodular density in the medial left apex likely a confluency shadows given the absence of pulmonary nodule dislocation on recent CT scan. No pulmonary edema. No focal airspace consolidation. No evidence of pleural effusion. The visualized bony structures of the thorax are intact. Telemetry leads overlie the chest. IMPRESSION: Stable.  No acute findings. Electronically Signed   By: Misty Stanley M.D.   On: 08/31/2016 13:38   Ct Head Wo Contrast  Result Date: 08/31/2016 CLINICAL DATA:  Altered mental status and right-sided chest pain. Bladder cancer. On chemotherapy. EXAM: CT HEAD WITHOUT CONTRAST TECHNIQUE: Contiguous axial images were obtained from the base of the skull through the vertex without intravenous contrast. COMPARISON:  None. FINDINGS: Brain: Mild cerebral atrophy. No mass lesion, hemorrhage, hydrocephalus, acute infarct, intra-axial, or extra-axial fluid collection. Vascular: No  hyperdense vessel or unexpected calcification. Skull: Normal Sinuses/Orbits: Normal orbits and globes. Clear paranasal sinuses and mastoid air cells. Other: None IMPRESSION: 1.  No acute intracranial abnormality. 2. Cerebral atrophy. Electronically Signed   By: Abigail Miyamoto M.D.   On: 08/31/2016 14:30   Ct Angio Chest Pe W And/or Wo Contrast  Result Date: 08/31/2016 CLINICAL DATA:  Bladder cancer being treated with chemotherapy. Worsening upper back pain and chest pain. EXAM: CT ANGIOGRAPHY CHEST WITH CONTRAST TECHNIQUE: Multidetector CT imaging of the chest was performed using the standard protocol during bolus administration of intravenous contrast. Multiplanar CT image reconstructions and MIPs were obtained to evaluate the vascular anatomy. CONTRAST:  100 cc Isovue 370 COMPARISON:  Chest radiography same day.  CT 07/26/2016. FINDINGS: Cardiovascular: Previous median sternotomy and CABG. Pulmonary arterial opacification is good. No pulmonary emboli are seen. No acute aortic pathology. No pericardial fluid. Mediastinum/Nodes: No hilar or mediastinal mass or lymphadenopathy. Lungs/Pleura: Newly seen dependent atelectasis in both lungs, particularly in the lower lobes. No evidence of pulmonary metastatic lesion. Mild emphysema at the lung apices. No pleural effusion. Upper Abdomen: Now evident is extensive metastatic disease throughout the liver. This represents massive and rapid progression. 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 Probable early metastatic disease within the T8 vertebral body possibly extending to the right pedicle, within the T11 and T12 vertebral bodies. Minimal superior endplate depression at N16 and T12. Cannot accurately assess for tumor in the spinal canal. Probable involvement of the proximal left tenth rib. Definite metastatic disease affecting the right second rib with a surrounding soft tissue mass indenting the pleura. Review  of the MIP  images confirms the above findings. IMPRESSION: No pulmonary emboli or acute vascular pathology. Massive rapid progression of metastatic disease throughout the liver. Dependent pulmonary atelectasis. Metastatic disease affecting the right second rib with surrounding soft tissue mass. Likely metastatic disease affecting the proximal left tenth rib. Probable metastatic disease within T8, T11 and T12. Electronically Signed   By: Nelson Chimes M.D.   On: 08/31/2016 14:39    Procedures Procedures (including critical care time)  Sepsis - Repeat Assessment  Performed at:    [facility] ED  Vitals     Blood pressure 109/69, pulse 95, temperature 98.1 F (36.7 C), temperature source Oral, resp. rate 20, height '5\' 11"'  (1.803 m), weight 168 lb (76.2 kg), SpO2 100 %.  Heart:     Regular rate and rhythm  Lungs:    CTA  Capillary Refill:   <2 sec  Peripheral Pulse:   Radial pulse palpable  Skin:     Pale   Medications Ordered in ED Medications  HYDROmorphone (DILAUDID) injection 0.5 mg (not administered)  sodium chloride 0.9 % bolus 1,000 mL (not administered)  potassium chloride SA (K-DUR,KLOR-CON) CR tablet 40 mEq (not administered)     Initial Impression / Assessment and Plan / ED Course  I have reviewed the triage vital signs and the nursing notes.  Pertinent labs & imaging results that were available during my care of the patient were reviewed by me and considered in my medical decision making (see chart for details).     Patient found to have lactic acidosis, concern is he has a urinary tract infection. Sepsis evaluation delayed and there was no clear indication at first without obvious fever. CP could be MSK. Discussed worsening disease seen on CT with patient and wife. Given sepsis fluids, seems to have improved. Given cefepime for UTI with recent instrumentation. I think his sweating is more likely from breaking a fever than an ACS. Mental status seems to be improved, is more awake and  interactive. Admit to hospitalist. Consulted palliative care at wife's request.  Final Clinical Impressions(s) / ED Diagnoses   Final diagnoses:  Severe sepsis [facility])    New Prescriptions New Prescriptions   No medications on file     Sherwood Gambler, MD 08/31/16 [facility] [place]

## 2016-08-31 NOTE — ED Triage Notes (Signed)
Pt developed chest pain this morning, EMS evaluated, pt refused to come to hosp, wife drove him, she reports AMS and Bladder Ca as problems also.

## 2016-08-31 NOTE — ED Notes (Signed)
Bed: [id number] Expected date:  Expected time:  Means of arrival:  Comments: [place]us

## 2016-08-31 NOTE — Progress Notes (Signed)
   08/31/16 0000  CM Assessment  Expected Discharge Plan Home w Hospice Care  In-house Referral NA  Discharge Planning Services CM Consult  [facility] Choice Hospice  Choice offered to / list presented to  Spouse  DME Arranged N/A  DME Agency NA  HH Arranged NA  [place] Agency NA  Status of Service In process, will continue to follow  Discharge Disposition Home w [place]   ED CM consulted by admission RN, Carlene after speaking with wife at bedside who is tearful - Interested in hospice and medicaid services/financial counseling Cm spoke with wife about hospital palliative/hospice consult and a financial consult Discussed medicaid application can be initiated by any family member online Provided website and contact information for medicaid and DSS Provided a list of home hospice providers and hospice facilities Discussed wife concerns for hospice with EDP Regenia Skeeter who entered palliative care consult order in EPIC to assist wife  1605 LVM for Marciano Sequin, financial counselor to request possibly making contact with wife to answer financial concern questions

## 2016-08-31 NOTE — ED Notes (Signed)
Pt in CT at present time 

## 2016-08-31 NOTE — ED Notes (Signed)
Pt transported to DG.  

## 2016-08-31 NOTE — ED Notes (Signed)
Pt verbalizes worsening upper/mid back pain "around shoulder blades" and generalized chest sharpness of past few weeks. Pt recently changed from 100 mcg fentanyl patch to 125 mcg fentanyl patch in past 72 hours. New patch applied 2 hours ago. Per wife pt seems more confused today. Pt alert to self, situation, place, and day but date.

## 2016-08-31 NOTE — Progress Notes (Addendum)
Pharmacy Antibiotic Note  Alexander Wolf is a 61 y.o. male with PMH of metastatic bladder cancer who presented to [facility] ED on 08/31/2016 with AMS, chest pain, and decreased urine output. Pharmacy has been consulted for Cefepime dosing for sepsis secondary to UTI.  Plan: Cefepime 2g IV q12h. Monitor renal function, cultures, clinical course.   Height: 5\' 11"  (180.3 cm) Weight: 168 lb (76.2 kg) IBW/kg (Calculated) : 75.3  Temp (24hrs), Avg:99.2 F (37.3 C), Min:98.7 F (37.1 C), Max:99.7 F (37.6 C)   Recent Labs Lab 08/31/16 1303 08/31/16 1455  WBC 5.4  --   CREATININE 1.12  --   LATICACIDVEN  --  3.37*    Estimated Creatinine Clearance: 74.7 mL/min (by C-G formula based on SCr of 1.12 mg/dL).    No Known Allergies  Antimicrobials this admission: 1/19 >> Cefepime >>  Dose adjustments this admission: --  Microbiology results: 1/19 BCx: sent 1/19 UCx: sent    Thank you for allowing pharmacy to be a part of this patient's care.   Lindell Spar, PharmD, BCPS Pager: [phone] 08/31/2016 3:35 PM    Addendum: Admitting MD has changed antibiotics to Ceftriaxone for severe sepsis secondary to UTI, with pharmacy asked to assist with dosing.  Plan:  Ceftriaxone 2g IV q24h. Ceftriaxone does not require renal or hepatic adjustment. Therefore, pharmacy will sign off at this time. Please reconsult if a change in clinical status warrants re-evaluation of dosage.   Lindell Spar, PharmD, BCPS Pager: [phone] 08/31/2016 6:11 PM

## 2016-08-31 NOTE — H&P (Signed)
History and Physical    Alexander Wolf DGL:[id number] DOB: [birth year] DOA: 08/31/2016  PCP: [name], MD   Patient coming from: Home   Chief Complaint: Chest pain.   HPI: Alexander Wolf is a 61 y.o. male with medical history significant of metastatic bladder cancer, who presents with the chief complain of chest pain. For the last 4 days he has been more confused, weak and disorientated.  On high dose of opiates for pain control. Dose of fentanyl recently increased to 125 mcg but due to confusion patient's wife continue 100 mcg patch. Today patient had significant chest pain, dull in nature, with no radiation, worse with movement, associated with diaphoresis, 10/10 in intensity with no improving factors. Mainly localized on the right upper chest. Due to severity of symptoms patient was brought to the hospital for further evaluation.   Last hospitalization on 06/3016, for postobstructive uropathy, had bilateral nephrostomy tubes placed. Tubes are being changed on a regular bases, patient started recently to pass urine through the bladder  Limited history due to patient's confusion, most information obtained from patient's wife at the bedside  ED Course: Urine from bladder sample, with pyuria, started on antibiotic therapy and referred for admission for further management.   Review of Systems: Unable to obtain due to patient's confusion  Past Medical History:  Diagnosis Date  . Bladder cancer ([place]) 2014   [facility] in [place], [place]; /  urologist in [place]-  dr Diona Fanti; lost to f/u, redeveloped problems in 7/17  . BPH (benign prostatic hypertrophy) with urinary retention   . Coronary artery disease    s/p CABG  (per pt hasn't seen cardiologist for several years)  . Foley catheter in place   . GERD (gastroesophageal reflux disease)   . Hyperlipidemia   . Hypertension   . Insomnia   . 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 Type 2 diabetes mellitus ([place])     Past Surgical History:  Procedure  Laterality Date  . CARDIAC CATHETERIZATION  10/26/2003  dr Leonia Reeves   ETT + ischemia/  severe 3 vessel avcad,  normal LVF, ef 55-60%  . CORONARY ARTERY BYPASS GRAFT  10-28-2003   dr Ricard Dillon   LIMA to dLAD,  RIMA to OM1,  SVG to OM2,  SVG to  right posterolateral   . CYSTOSCOPY N/A 03/29/2016   Procedure: CYSTOSCOPY;  Surgeon: Franchot Gallo, MD;  Location: [facility];  Service: Urology;  Laterality: N/A;  . IR GENERIC HISTORICAL  04/19/2016   IR US GUIDE VASC ACCESS RIGHT 04/19/2016 Corrie Mckusick, DO [facility]  . IR GENERIC HISTORICAL  04/19/2016   IR FLUORO GUIDE PORT INSERTION RIGHT 04/19/2016 Corrie Mckusick, DO [facility]  . IR GENERIC HISTORICAL  07/12/2016   IR NEPHROSTOMY PLACEMENT RIGHT 07/12/2016 Sandi Mariscal, MD [facility]  . IR GENERIC HISTORICAL  07/12/2016   IR NEPHROSTOMY PLACEMENT LEFT 07/12/2016 Sandi Mariscal, MD [facility]  . IR GENERIC HISTORICAL  08/28/2016   IR NEPHROSTOMY EXCHANGE LEFT 08/28/2016 Sandi Mariscal, MD [facility]  . IR GENERIC HISTORICAL  08/28/2016   IR NEPHROSTOMY EXCHANGE RIGHT 08/28/2016 Sandi Mariscal, MD [facility]  . TONSILLECTOMY  as child  . TRANSURETHRAL RESECTION OF BLADDER TUMOR  2014  . TRANSURETHRAL RESECTION OF BLADDER TUMOR WITH GYRUS (TURBT-GYRUS) N/A 03/29/2016   Procedure: TRANSURETHRAL RESECTION OF BLADDER TUMOR WITH GYRUS (TURBT-GYRUS);  Surgeon: Franchot Gallo, MD;  Location: [facility];  Service: Urology;  Laterality: N/A;  . TRANSURETHRAL  RESECTION OF PROSTATE N/A 03/29/2016   Procedure: TRANSURETHRAL RESECTION OF THE PROSTATE (TURP);  Surgeon: Franchot Gallo, MD;  Location: [facility];  Service: Urology;  Laterality: N/A;     reports that he quit smoking about 4 years ago. His smoking use included Cigarettes. He has a 44.00 pack-year smoking history. He has never used smokeless tobacco. He reports that he does not drink alcohol or use drugs.  No Known Allergies  Family History  Problem  Relation Age of Onset  . Varicose Veins Mother   . Cancer Father     lung  . Cancer Maternal Grandmother     breast  . Cancer Paternal Grandmother     brain   Unacceptable: Noncontributory, unremarkable, or negative. Acceptable: Family history reviewed and not pertinent (If you reviewed it)  Prior to Admission medications   Medication Sig Start Date End Date Taking? Authorizing Provider  docusate sodium (COLACE) 100 MG capsule Take 1 capsule (100 mg total) by mouth 2 (two) times daily as needed for mild constipation. 04/07/16  Yes Shawnee Knapp, MD  DULoxetine (CYMBALTA) 30 MG capsule Take 1 capsule (30 mg total) by mouth daily. 08/14/16  Yes Wyatt Portela, MD  fentaNYL (DURAGESIC - DOSED MCG/HR) 100 MCG/HR Place 1 patch (100 mcg total) onto the skin every 3 (three) days. 08/27/16  Yes Tyler Pita, MD  fentaNYL (DURAGESIC - DOSED MCG/HR) 25 MCG/HR patch Place 1 patch (25 mcg total) onto the skin every 3 (three) days. 08/27/16  Yes Tyler Pita, MD  glipiZIDE (GLUCOTROL) 5 MG tablet TAKE 1 TABLET BY MOUTH TWICE DAILY BEFORE A MEAL 07/15/16  Yes Shawnee Knapp, MD  lidocaine-prilocaine (EMLA) cream Apply to port before chemotherapy. 04/10/16  Yes Wyatt Portela, MD  lisinopril (PRINIVIL,ZESTRIL) 20 MG tablet  07/11/16  Yes Historical Provider, MD  metoprolol (LOPRESSOR) 50 MG tablet TAKE 1/2 TABLET BY MOUTH TWICE DAILY 06/13/16  Yes Shawnee Knapp, MD  omeprazole (PRILOSEC) 40 MG capsule TAKE 1 CAPSULE BY MOUTH EVERY DAY 03/29/16  Yes Wardell Honour, MD  oxyCODONE (ROXICODONE) 15 MG immediate release tablet Take 1 tablet (15 mg total) by mouth every 3 (three) hours as needed. 08/27/16  Yes Wyatt Portela, MD  zolpidem (AMBIEN) 10 MG tablet TAKE 1 TABLET BY MOUTH EVERY NIGHT AT BEDTIME AS NEEDED FOR SLEEP 06/05/16  Yes Shawnee Knapp, MD  atorvastatin (LIPITOR) 40 MG tablet Take 1 tablet (40 mg total) by mouth daily. Patient not taking: Reported on 08/27/2016 03/29/16   Wardell Honour, MD  blood glucose meter  kit and supplies KIT Dispense based on patient and insurance preference. Use up to four times daily as directed. (FOR ICD-9 250.00, 250.01). 08/22/16   Shawnee Knapp, MD  lidocaine (XYLOCAINE) 5 % ointment Apply 1 application topically as needed. Patient not taking: Reported on 08/31/2016 06/29/16   Shawnee Knapp, MD    Physical Exam: Vitals:   08/31/16 1438 08/31/16 1500 08/31/16 1526 08/31/16 1529  BP: 111/93 105/80 111/73   Pulse: 102 98 99   Resp: _0 Temp: 99.7 F (37.6 C)     TempSrc: Rectal     SpO2: 100% 99% 99%   Weight:    76.2 kg (168 lb)  Height:    5' 11" (1.803 m)      Constitutional: deconditioned and ill looking appearing Vitals:   08/31/16 1438 08/31/16 1500 08/31/16 1526 08/31/16 1529  BP: 111/93 105/80 111/73  Pulse: 102 98 99   Resp: _0 Temp: 99.7 F (37.6 C)     TempSrc: Rectal     SpO2: 100% 99% 99%   Weight:    76.2 kg (168 lb)  Height:    5' 11" (1.803 m)   Eyes: PERRL, lids and conjunctivae pale with mild icterus.  Head normocephalic, nose and ears with no deformities.  ENMT: Mucous membranes are dry. Posterior pharynx clear of any exudate or lesions.Normal dentition.  Neck: normal, supple, no masses, no thyromegaly Respiratory: decreased breath sounds on auscultation bilaterally at bases, no wheezing, no crackles. Normal respiratory effort. No accessory muscle use.  Cardiovascular: Regular rate and rhythm, no murmurs / rubs / gallops. No extremity edema. 2+ pedal pulses. No carotid bruits.  Abdomen: no tenderness, no masses palpated. No hepatosplenomegaly. Bowel sounds positive. Nephrostomy tubes in place with clear urine on bags.  Musculoskeletal: no clubbing / cyanosis. No joint deformity upper and lower extremities. Good ROM, no contractures. Normal muscle tone.  Skin: no rashes, lesions, ulcers. No induration Neurologic: CN 2-12 grossly intact. Sensation intact, DTR normal. Strength 5/5 in all 4. Patient non focal but slow to respond to  questions.    Labs on Admission: I have personally reviewed following labs and imaging studies  CBC:  Recent Labs Lab 08/31/16 1303  WBC 5.4  HGB 9.1*  HCT 27.0*  MCV 83.9  PLT 161*   Basic Metabolic Panel:  Recent Labs Lab 08/31/16 1303  NA 133*  K 3.2*  CL 96*  CO2 20*  GLUCOSE 219*  BUN 16  CREATININE 1.12  CALCIUM 6.7*   GFR: Estimated Creatinine Clearance: 74.7 mL/min (by C-G formula based on SCr of 1.12 mg/dL). Liver Function Tests: No results for input(s): AST, ALT, ALKPHOS, BILITOT, PROT, ALBUMIN in the last 168 hours. No results for input(s): LIPASE, AMYLASE in the last 168 hours. No results for input(s): AMMONIA in the last 168 hours. Coagulation Profile: No results for input(s): INR, PROTIME in the last 168 hours. Cardiac Enzymes: No results for input(s): CKTOTAL, CKMB, CKMBINDEX, TROPONINI in the last 168 hours. BNP (last 3 results) No results for input(s): PROBNP in the last 8760 hours. HbA1C: No results for input(s): HGBA1C in the last 72 hours. CBG: No results for input(s): GLUCAP in the last 168 hours. Lipid Profile: No results for input(s): CHOL, HDL, LDLCALC, TRIG, CHOLHDL, LDLDIRECT in the last 72 hours. Thyroid Function Tests: No results for input(s): TSH, T4TOTAL, FREET4, T3FREE, THYROIDAB in the last 72 hours. Anemia Panel: No results for input(s): VITAMINB12, FOLATE, FERRITIN, TIBC, IRON, RETICCTPCT in the last 72 hours. Urine analysis:    Component Value Date/Time   COLORURINE YELLOW 08/31/2016 1445   APPEARANCEUR TURBID (A) 08/31/2016 1445   LABSPEC 1.014 08/31/2016 1445   PHURINE 5.0 08/31/2016 1445   GLUCOSEU NEGATIVE 08/31/2016 1445   HGBUR MODERATE (A) 08/31/2016 1445   BILIRUBINUR NEGATIVE 08/31/2016 1445   BILIRUBINUR negative 04/07/2016 1446   BILIRUBINUR neg 09/24/2014 0851   KETONESUR 5 (A) 08/31/2016 1445   PROTEINUR 100 (A) 08/31/2016 1445   UROBILINOGEN 0.2 04/07/2016 1446   NITRITE NEGATIVE 08/31/2016 1445    LEUKOCYTESUR MODERATE (A) 08/31/2016 1445   Sepsis Labs: !!!!!!!!!!!!!!!!!!!!!!!!!!!!!!!!!!!!!!!!!!!! _1 (procalcitonin:4,lacticidven:4) )No results found for this or any previous visit (from the past 240 hour(s)).   Radiological Exams on Admission: Dg Chest 2 View  Result Date: 08/31/2016 CLINICAL DATA:  61 year old male with chest pain and shortness of Breath starting today. EXAM: CHEST  2 VIEW COMPARISON:  Chest CT 07/26/2016.  Chest x-ray 11/22/2003 FINDINGS: Stable asymmetric elevation right hemidiaphragm. Right Port-A-Cath tip overlies the mid to distal SVC. Patient is status post CABG. Nodular density in the medial left apex likely a confluency shadows given the absence of pulmonary nodule dislocation on recent CT scan. No pulmonary edema. No focal airspace consolidation. No evidence of pleural effusion. The visualized bony structures of the thorax are intact. Telemetry leads overlie the chest. IMPRESSION: Stable.  No acute findings. Electronically Signed   By: Misty Stanley M.D.   On: 08/31/2016 13:38   Ct Head Wo Contrast  Result Date: 08/31/2016 CLINICAL DATA:  Altered mental status and right-sided chest pain. Bladder cancer. On chemotherapy. EXAM: CT HEAD WITHOUT CONTRAST TECHNIQUE: Contiguous axial images were obtained from the base of the skull through the vertex without intravenous contrast. COMPARISON:  None. FINDINGS: Brain: Mild cerebral atrophy. No mass lesion, hemorrhage, hydrocephalus, acute infarct, intra-axial, or extra-axial fluid collection. Vascular: No hyperdense vessel or unexpected calcification. Skull: Normal Sinuses/Orbits: Normal orbits and globes. Clear paranasal sinuses and mastoid air cells. Other: None IMPRESSION: 1.  No acute intracranial abnormality. 2. Cerebral atrophy. Electronically Signed   By: Abigail Miyamoto M.D.   On: 08/31/2016 14:30   Ct Angio Chest Pe W And/or Wo Contrast  Result Date: 08/31/2016 CLINICAL DATA:  Bladder cancer being treated with  chemotherapy. Worsening upper back pain and chest pain. EXAM: CT ANGIOGRAPHY CHEST WITH CONTRAST TECHNIQUE: Multidetector CT imaging of the chest was performed using the standard protocol during bolus administration of intravenous contrast. Multiplanar CT image reconstructions and MIPs were obtained to evaluate the vascular anatomy. CONTRAST:  100 cc Isovue 370 COMPARISON:  Chest radiography same day.  CT 07/26/2016. FINDINGS: Cardiovascular: Previous median sternotomy and CABG. Pulmonary arterial opacification is good. No pulmonary emboli are seen. No acute aortic pathology. No pericardial fluid. Mediastinum/Nodes: No hilar or mediastinal mass or lymphadenopathy. Lungs/Pleura: Newly seen dependent atelectasis in both lungs, particularly in the lower lobes. No evidence of pulmonary metastatic lesion. Mild emphysema at the lung apices. No pleural effusion. Upper Abdomen: Now evident is extensive metastatic disease throughout the liver. This represents massive and rapid progression. 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 Probable early metastatic disease within the T8 vertebral body possibly extending to the right pedicle, within the T11 and T12 vertebral bodies. Minimal superior endplate depression at O96 and T12. Cannot accurately assess for tumor in the spinal canal. Probable involvement of the proximal left tenth rib. Definite metastatic disease affecting the right second rib with a surrounding soft tissue mass indenting the pleura. Review of the MIP images confirms the above findings. IMPRESSION: No pulmonary emboli or acute vascular pathology. Massive rapid progression of metastatic disease throughout the liver. Dependent pulmonary atelectasis. Metastatic disease affecting the right second rib with surrounding soft tissue mass. Likely metastatic disease affecting the proximal left tenth rib. Probable metastatic disease within T8, T11 and T12. Electronically Signed    By: Nelson Chimes M.D.   On: 08/31/2016 14:39    EKG: Independently reviewed. Sinus tachycardia with lateral T-wave inversions  Assessment/Plan Active Problems:   Severe sepsis [facility])   This is a 61 year old male who has significant history for metastatic bladder cancer presents with chest pain, at home difficult to adjust pain regimen due to altered mentation. Recent hospitalization for urinary retention, obstructive uropathy status post nephrostomy tubes placement. Initial physical examination temperature 98.7, blood pressure 126/83, heart rate 104, respiratory 18-23, oxygen pressure 100%  room air. His pain, he does have dry oral mucosa, his lungs are clear to auscultation, heart S1-S2 present rhythmic, lower extremities with no edema. Sodium 133, potassium 3.2, chloride 96, bicarbonate 20, glucose 219, BUN 16, creatinine 1.12 alkaline phosphatase 277, bilirubin is 0.6, white count 5.4, hemoglobin 9.1, hematocrit 27.0, platelets 122, lactic acid 3.37. Bladder urine had too numerous to count white cells, moderate leukocytes. Chest CT with no pulmonary embolism, massive rapid progression of metastatic disease through the liver, metastatic disease affecting the right second rib with surrounding soft tissue mass, likely metastatic disease affecting the proximal left 10th rib. Chest x-ray personally reviewed showed right hemidiaphragm elevation, no infiltrates, effusions or signs of pneumothorax.  The patient will be admitted to hospital with working diagnosis of sepsis due to urinary tract infection complicated by worsening metastatic disease.  1. Sepsis, present on admission. Patient will be admitted to medical floor on a telemetry monitor, he will be placed on ceftriaxone intravenously, follow-up cell count, blood cultures and temperature curve. Will continue hydration with isotonic saline.  2. Progressive metastatic disease. Will continue pain control with fentanyl and oxycodone, will add IV morphine  for breakthrough pain control. Will contact the in the morning oncology service, patient might be a good candidate for palliative care services.  3. Hypertension. We'll continue metoprolol 50 mg half tablet twice daily, will hold on lisinopril for now to avoid hypotension.  4. Type 2 diabetes mellitus. Will hold on oral hypoglycemic agents, continue insulin sliding scale for glucose coverage and monitoring.  5. Dyslipidemia. Considering patient's condition and poor prognosis, will stop Atorvastatin.   6. Depression. Continue Cymbalta.     DVT prophylaxis: enoxaparin  Code Status: DNR Family Communication: Patient's wife at the bedside and all questions were addressed.  Disposition Plan: Home  Consults called: None  Admission status: Inpatient    Olsen Mccutchan Gerome Apley MD [facility] Pager [phone]  If 7PM-7AM, please contact night-coverage [email or web address] Password [email or web address]  08/31/2016, 3:55 PM

## 2016-08-31 NOTE — ED Notes (Signed)
Both sets of blood cultures have been drawn. 

## 2016-08-31 NOTE — Progress Notes (Signed)
CRITICAL VALUE ALERT  Critical value received: lactid acid 2.2  Date of notification:  08/31/16  Time of notification:  2135  Critical value read back:Yes.    Nurse who received alert:  Ellen Henri RN  MD notified (1st page):  [place], [place]  Time of first page:  2139  MD notified (2nd page): None  Time of second page:  Responding MD:  None  Time MD responded:  None

## 2016-09-01 DIAGNOSIS — D638 Anemia in other chronic diseases classified elsewhere: Secondary | ICD-10-CM

## 2016-09-01 DIAGNOSIS — Z515 Encounter for palliative care: Secondary | ICD-10-CM

## 2016-09-01 DIAGNOSIS — G893 Neoplasm related pain (acute) (chronic): Secondary | ICD-10-CM

## 2016-09-01 DIAGNOSIS — E785 Hyperlipidemia, unspecified: Secondary | ICD-10-CM

## 2016-09-01 DIAGNOSIS — Z7189 Other specified counseling: Secondary | ICD-10-CM

## 2016-09-01 DIAGNOSIS — Z8551 Personal history of malignant neoplasm of bladder: Secondary | ICD-10-CM

## 2016-09-01 DIAGNOSIS — N390 Urinary tract infection, site not specified: Secondary | ICD-10-CM

## 2016-09-01 DIAGNOSIS — C674 Malignant neoplasm of posterior wall of bladder: Secondary | ICD-10-CM

## 2016-09-01 DIAGNOSIS — I10 Essential (primary) hypertension: Secondary | ICD-10-CM

## 2016-09-01 DIAGNOSIS — R7989 Other specified abnormal findings of blood chemistry: Secondary | ICD-10-CM

## 2016-09-01 LAB — CBC
HCT: 23.9 % — ABNORMAL LOW (ref 39.0–52.0)
Hemoglobin: 7.8 g/dL — ABNORMAL LOW (ref 13.0–17.0)
MCH: 28.2 pg (ref 26.0–34.0)
MCHC: 32.6 g/dL (ref 30.0–36.0)
MCV: 86.3 fL (ref 78.0–100.0)
PLATELETS: 96 10*3/uL — AB (ref 150–400)
RBC: 2.77 MIL/uL — AB (ref 4.22–5.81)
RDW: 15.2 % (ref 11.5–15.5)
WBC: 4 10*3/uL (ref 4.0–10.5)

## 2016-09-01 LAB — COMPREHENSIVE METABOLIC PANEL
ALBUMIN: 2.3 g/dL — AB (ref 3.5–5.0)
ALT: 10 U/L — AB (ref 17–63)
AST: 37 U/L (ref 15–41)
Alkaline Phosphatase: 230 U/L — ABNORMAL HIGH (ref 38–126)
Anion gap: 9 (ref 5–15)
BUN: 10 mg/dL (ref 6–20)
CO2: 23 mmol/L (ref 22–32)
CREATININE: 0.83 mg/dL (ref 0.61–1.24)
Calcium: 6.2 mg/dL — CL (ref 8.9–10.3)
Chloride: 103 mmol/L (ref 101–111)
GFR calc Af Amer: >60 mL/min (ref >60)
GFR calc non Af Amer: >60 mL/min (ref >60)
GLUCOSE: 87 mg/dL (ref 65–99)
Potassium: 3.4 mmol/L — ABNORMAL LOW (ref 3.5–5.1)
SODIUM: 135 mmol/L (ref 135–145)
Total Bilirubin: 0.5 mg/dL (ref 0.3–1.2)
Total Protein: 5.9 g/dL — ABNORMAL LOW (ref 6.5–8.1)

## 2016-09-01 LAB — GLUCOSE, CAPILLARY
GLUCOSE-CAPILLARY: 105 mg/dL — AB (ref 65–99)
GLUCOSE-CAPILLARY: 101 mg/dL — AB (ref 65–99)
Glucose-Capillary: 122 mg/dL — ABNORMAL HIGH (ref 65–99)
Glucose-Capillary: 100 mg/dL — ABNORMAL HIGH (ref 65–99)

## 2016-09-01 LAB — PROCALCITONIN: Procalcitonin: 0.56 ng/mL

## 2016-09-01 LAB — LACTIC ACID, PLASMA
LACTIC ACID, VENOUS: 2.1 mmol/L — AB (ref 0.5–1.9)
Lactic Acid, Venous: 2.3 mmol/L (ref 0.5–1.9)
Lactic Acid, Venous: 2.3 mmol/L (ref 0.5–1.9)
Lactic Acid, Venous: 2.2 mmol/L (ref 0.5–1.9)

## 2016-09-01 MED ORDER — SODIUM CHLORIDE 0.9 % IV BOLUS (SEPSIS)
500.0000 mL | Freq: Once | INTRAVENOUS | Status: AC
  Administered 2016-09-01: 500 mL via INTRAVENOUS

## 2016-09-01 MED ORDER — SODIUM CHLORIDE 0.9 % IV BOLUS (SEPSIS)
1000.0000 mL | Freq: Once | INTRAVENOUS | Status: AC
  Administered 2016-09-01: 1000 mL via INTRAVENOUS

## 2016-09-01 MED ORDER — HYDROMORPHONE HCL 1 MG/ML IJ SOLN
0.5000 mg | INTRAMUSCULAR | Status: DC | PRN
  Administered 2016-09-01 – 2016-09-03 (×4): 0.5 mg via INTRAVENOUS
  Filled 2016-09-01 (×4): qty 0.5

## 2016-09-01 MED ORDER — OXYCODONE HCL 5 MG PO TABS
15.0000 mg | ORAL_TABLET | ORAL | Status: DC | PRN
  Administered 2016-09-01: 15 mg via ORAL
  Administered 2016-09-01 – 2016-09-04 (×14): 30 mg via ORAL
  Filled 2016-09-01 (×4): qty 6
  Filled 2016-09-01: qty 3
  Filled 2016-09-01 (×10): qty 6

## 2016-09-01 MED ORDER — POTASSIUM CHLORIDE CRYS ER 20 MEQ PO TBCR
40.0000 meq | EXTENDED_RELEASE_TABLET | Freq: Once | ORAL | Status: AC
  Administered 2016-09-01: 40 meq via ORAL
  Filled 2016-09-01: qty 2

## 2016-09-01 NOTE — Progress Notes (Signed)
CRITICAL VALUE ALERT  Critical value received:  Lactid acid 2.1  Date of notification:  09/01/16  Time of notification:  0052  Critical value read back:Yes.    Nurse who received alert:  Ellen Henri RN  MD notified (1st page):  [place], [place]  Time of first page:  1am  MD notified (2nd page):None  Time of second page: None  Responding MD:  none  Time MD responded:  None

## 2016-09-01 NOTE — Progress Notes (Signed)
CRITICAL VALUE ALERT  Critical value received:  Calcium 6.2 Date of notification:  09/01/16  Time of notification:  0520  Critical value read back:Yes.    Nurse who received alert:  Ellen Henri RN  MD notified (1st page):  NP oncall  Time of first page:  [phone]  MD notified (2nd page):  Time of second page:  Responding MD:  NP oncall  Time MD responded:  [phone]

## 2016-09-01 NOTE — Progress Notes (Signed)
PROGRESS NOTE    Alexander Wolf  [id number] DOB: 08/19/1955 DOA: 08/31/2016 PCP: [name], MD   Brief Narrative:  Alexander Wolf is a 61 y.o. male with medical history significant of metastatic bladder cancer, who presents with the chief complain of chest wall pain. For the last 4 days he has been more confused, weak and disorientated.  On high dose of opiates for pain control. Dose of fentanyl recently increased to 125 mcg but due to confusion patient's wife continue 100 mcg patch. Today patient had significant chest wall pain, dull in nature, with no radiation, worse with movement, associated with diaphoresis, 10/10 in intensity with no improving factors. Mainly localized on the right upper chest. Due to severity of symptoms patient was brought to the hospital for further evaluation.  Last hospitalization on 06/3016, for postobstructive uropathy, had bilateral nephrostomy tubes placed. Tubes are being changed on a regular bases, patient started recently to pass urine through the bladder. Was worked up and found to be septic and suspected from [place]. Currently being treated for UTI. Palliative Care Medicine consulted and Oncology notified by EPIC.   Assessment & Plan:   Principal Problem:   Sepsis ([place]) Active Problems:   Essential hypertension   Hyperlipidemia LDL goal <70   Hx of bladder cancer   Malignant neoplasm of posterior wall of urinary bladder (HCC)   Elevated lactic acid level   Anemia of chronic disease   Neoplasm related pain   Severe sepsis (HCC)   UTI (urinary tract infection)  Sepsis likely 2/2 to Urinary Tract Infection -Patient was Tachycardic, Tachypenic and Confused on Admission with suspected source of infection being urine -S/P Fluid Boluses of 3.5 Liters; Maintenance Fluid now at 100 mL/hr -Urinalysis showed Many Bacteria, Moderate Leukocytes, Negative Nitrites, and TNTC WBC; Urine Cx Pending -Blood Cx x 2 showed NGTD at <24 hours -C/w IVF at 100 mL/hr  and IV Abx with Ceftriaxone 2 gram q24h -Last Hospitalization on 07/12/16 for Postiobstructive Uropathy s/p Bilateral Nephrostomy Tubes -Will check Procalcitonin and Trend Lactic Acid Levels -Continue to Monitor extremely closely   Acute Encephalopathy, improving -Likely Toxic in Nature from UTI -Improving; Has been confused, weak, and disoriented for the last 4 days -Recent Dose of Fentanyl Patch was increased to 125 mcg; May also be contributing -Continue to Monitor -Head CT showed no acute intracranial abnormality and cerebral atrophy  Lactic Acidosis -LA went from 3.37 -> 2.3 -Increased IVF Rehydration with NS at 100 mL/hr -Continue to Monitor Lactic Acid Levels  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 Massive rapid progression of metastatic disease throughout the liver. Dependent pulmonary atelectasis. Metastatic disease affecting the right second rib with surrounding soft tissue mass. Likely metastatic disease affecting the proximal left tenth rib. Probable metastatic disease within T8, T11 and T12. -Pain Control with Oxycodone 15 mg po q3hPRN, Morphine 1 mg po q6hprn -Continue Bowel Regimen and Antiemetics -Palliative Care Consulted for Goals of Care and Pain Control -Will Notify Oncology Dr. Alen Blew of Patient's Admission via EPIC  Chest Wall Pain -Likely From Metastasis; CT Chest Angio showed metastatic disease affecting the Right second rib with surrounding  sof tissue mass. Likely metastatic disease affecting the proximal left tenth rib.   Anemia likley of Chronic Disease from Bladder Cancer -Patient's Hb/Hct went from 9.1/27.0 -> 7.8/23.9 -Possibly from Cancer in combination with Dilution from Fluid  Hypokalemia -Potassium  3.4 this AM -Replete with 40 mEQ po KCl -Continue to Monitor BMP and Replete as  necessary  Hypertension.  -Lisinopril held on Admission -C/w Metoprolol Tartrate 25 mg po BID  Type 2 Diabetes Mellitus. -Will hold Home Oral hypoglycemic agents -Continue with Sensitive Novolog SSI  -CBG's ranging 83-122  Dyslipidemia.  -Considering patient's condition and poor prognosis Atorvastatin discontinued.   Depression -Continue Cymbalta at 30 mg po Daily   GERD -Continue Pantoprazole 40 mg po Daily  CAD with Hx of CABG x4 in 2005 -Has not followed up with Cardiology for several years -C/w Metoprolol; Atorvastatin D/C'd on Admission  DVT prophylaxis: Heparin 5,000 units sq q8h Code Status: DO NOT RESUSCITATE Family Communication: No family present at bedside Disposition Plan: Remain Inpatient; Will obtain PT Evaluation  Consultants:   Palliative Care Medicine  Oncology Notified via EPIC  Procedures: None  Antimicrobials:  Anti-infectives    Start     Dose/Rate Route Frequency Ordered Stop   09/01/16 0400  ceFEPIme (MAXIPIME) 2 g in dextrose 5 % 50 mL IVPB  Status:  Discontinued     2 g 100 mL/hr over 30 Minutes Intravenous Every 12 hours 08/31/16 1539 08/31/16 1752   09/01/16 0400  cefTRIAXone (ROCEPHIN) 2 g in dextrose 5 % 50 mL IVPB     2 g 100 mL/hr over 30 Minutes Intravenous Every 24 hours 08/31/16 1807     08/31/16 1515  ceFEPIme (MAXIPIME) 2 g in dextrose 5 % 50 mL IVPB     2 g 100 mL/hr over 30 Minutes Intravenous  Once 08/31/16 1509 08/31/16 1611     Subjective: Seen and examined bedside this AM and had improved mentation and was answering questions appropriately. Denied any pain currently and states he felt better. No CP/SOB/N/V.   Objective: Vitals:   08/31/16 1645 08/31/16 1748 08/31/16 2119 09/01/16 0834  BP: 107/68 109/69 123/68 124/75  Pulse: 93 95 100 (!) 103  Resp: 23 20 17    Temp: 98.3 F (36.8 C) 98.1 F (36.7 C) 98 F (36.7 C)   TempSrc: Oral Oral Oral   SpO2: 100% 100% 98%   Weight:      Height:         Intake/Output Summary (Last 24 hours) at 09/01/16 1327 Last data filed at 09/01/16 1130  Gross per 24 hour  Intake          3273.75 ml  Output             1885 ml  Net          1388.75 ml   Filed Weights   08/31/16 1254 08/31/16 1529  Weight: 78.9 kg (174 lb) 76.2 kg (168 lb)   Examination: Physical Exam:  Constitutional: ill-appearing 61 year old male who appears older than stated age. Currently in NAD Eyes:  Lids and conjunctivae normal, sclerae anicteric  ENMT: External Ears, Nose appear normal. Grossly normal hearing. Neck: Appears normal, supple, no cervical masses, normal ROM, no appreciable thyromegaly; no visible JVD Respiratory: Clear to auscultation bilaterally, no wheezing, rales, rhonchi or crackles. Normal respiratory effort and patient is not tachypenic. No accessory muscle use.  Chest Wall: Chemoport in Place Cardiovascular: RRR, no murmurs / rubs / gallops. S1 and S2 auscultated. No extremity edema.  Abdomen: Soft, non-tender, non-distended. No masses palpated. No appreciable hepatosplenomegaly. Bowel sounds positive.  GU: Deferred. Has bilateral Nephrostomy tubes that are draining  Musculoskeletal: No clubbing / cyanosis of digits/nails. No joint deformity upper and lower extremities. Skin:  No rashes, lesions, ulcers on limited skin eval. No induration; Warm and dry.  Neurologic: CN 2-12 grossly intact with no focal deficits. Sensation intact in all 4 Extremities, Romberg sign cerebellar reflexes not assessed.  Psychiatric: Normal judgment and insight. Alert and awake and knows he is in the hospital. Normal mood and appropriate affect.   Data Reviewed: I have personally reviewed following labs and imaging studies  CBC:  Recent Labs Lab 08/31/16 1303 08/31/16 2040 09/01/16 0419  WBC 5.4 3.9* 4.0  HGB 9.1* 7.6* 7.8*  HCT 27.0* 23.6* 23.9*  MCV 83.9 85.8 86.3  PLT 122* 95* 96*   Basic Metabolic Panel:  Recent Labs Lab 08/31/16 1303 08/31/16 2040  09/01/16 0419  NA 133*  --  135  K 3.2*  --  3.4*  CL 96*  --  103  CO2 20*  --  23  GLUCOSE 219*  --  87  BUN 16  --  10  CREATININE 1.12 0.87 0.83  CALCIUM 6.7*  --  6.2*   GFR: Estimated Creatinine Clearance: 100.8 mL/min (by C-G formula based on SCr of 0.83 mg/dL). Liver Function Tests:  Recent Labs Lab 08/31/16 1303 09/01/16 0419  AST 36 37  ALT 13* 10*  ALKPHOS 277* 230*  BILITOT 0.6 0.5  PROT 7.1 5.9*  ALBUMIN 2.9* 2.3*   No results for input(s): LIPASE, AMYLASE in the last 168 hours. No results for input(s): AMMONIA in the last 168 hours. Coagulation Profile: No results for input(s): INR, PROTIME in the last 168 hours. Cardiac Enzymes: No results for input(s): CKTOTAL, CKMB, CKMBINDEX, TROPONINI in the last 168 hours. BNP (last 3 results) No results for input(s): PROBNP in the last 8760 hours. HbA1C: No results for input(s): HGBA1C in the last 72 hours. CBG:  Recent Labs Lab 08/31/16 1812 08/31/16 2122 09/01/16 0743 09/01/16 1241  GLUCAP 85 83 105* 122*   Lipid Profile: No results for input(s): CHOL, HDL, LDLCALC, TRIG, CHOLHDL, LDLDIRECT in the last 72 hours. Thyroid Function Tests: No results for input(s): TSH, T4TOTAL, FREET4, T3FREE, THYROIDAB in the last 72 hours. Anemia Panel: No results for input(s): VITAMINB12, FOLATE, FERRITIN, TIBC, IRON, RETICCTPCT in the last 72 hours. Sepsis Labs:  Recent Labs Lab 08/31/16 1455 08/31/16 2040 09/01/16 0000 09/01/16 1050  LATICACIDVEN 3.37* 2.2* 2.1* 2.3*    Recent Results (from the past 240 hour(s))  Blood Culture (routine x 2)     Status: None (Preliminary result)   Collection Time: 08/31/16  3:24 PM  Result Value Ref Range Status   Specimen Description BLOOD RIGHT ANTECUBITAL  Final   Special Requests BOTTLES DRAWN AEROBIC AND ANAEROBIC 5CC  Final   Culture   Final    NO GROWTH < 24 HOURS Performed at [facility], [place] [address], [place], [place] [address]    Report Status PENDING   Incomplete  Blood Culture (routine x 2)     Status: None (Preliminary result)   Collection Time: 08/31/16  3:28 PM  Result Value Ref Range Status   Specimen Description BLOOD RIGHT HAND  Final   Special Requests IN PEDIATRIC BOTTLE 4CC  Final   Culture   Final    NO GROWTH < 24 HOURS Performed at [facility], [place] [address] [place], [place] [address]    Report Status PENDING  Incomplete    Radiology Studies: Dg Chest 2 View  Result Date: 08/31/2016 CLINICAL DATA:  61 year old male with chest pain and shortness of Breath starting today.  EXAM: CHEST  2 VIEW COMPARISON:  Chest CT 07/26/2016.  Chest x-ray 11/22/2003 FINDINGS: Stable asymmetric elevation right hemidiaphragm. Right Port-A-Cath tip overlies the mid to distal SVC. Patient is status post CABG. Nodular density in the medial left apex likely a confluency shadows given the absence of pulmonary nodule dislocation on recent CT scan. No pulmonary edema. No focal airspace consolidation. No evidence of pleural effusion. The visualized bony structures of the thorax are intact. Telemetry leads overlie the chest. IMPRESSION: Stable.  No acute findings. Electronically Signed   By: Misty Stanley M.D.   On: 08/31/2016 13:38   Ct Head Wo Contrast  Result Date: 08/31/2016 CLINICAL DATA:  Altered mental status and right-sided chest pain. Bladder cancer. On chemotherapy. EXAM: CT HEAD WITHOUT CONTRAST TECHNIQUE: Contiguous axial images were obtained from the base of the skull through the vertex without intravenous contrast. COMPARISON:  None. FINDINGS: Brain: Mild cerebral atrophy. No mass lesion, hemorrhage, hydrocephalus, acute infarct, intra-axial, or extra-axial fluid collection. Vascular: No hyperdense vessel or unexpected calcification. Skull: Normal Sinuses/Orbits: Normal orbits and globes. Clear paranasal sinuses and mastoid air cells. Other: None IMPRESSION: 1.  No acute intracranial abnormality. 2. Cerebral atrophy. Electronically Signed    By: Abigail Miyamoto M.D.   On: 08/31/2016 14:30   Ct Angio Chest Pe W And/or Wo Contrast  Result Date: 08/31/2016 CLINICAL DATA:  Bladder cancer being treated with chemotherapy. Worsening upper back pain and chest pain. EXAM: CT ANGIOGRAPHY CHEST WITH CONTRAST TECHNIQUE: Multidetector CT imaging of the chest was performed using the standard protocol during bolus administration of intravenous contrast. Multiplanar CT image reconstructions and MIPs were obtained to evaluate the vascular anatomy. CONTRAST:  100 cc Isovue 370 COMPARISON:  Chest radiography same day.  CT 07/26/2016. FINDINGS: Cardiovascular: Previous median sternotomy and CABG. Pulmonary arterial opacification is good. No pulmonary emboli are seen. No acute aortic pathology. No pericardial fluid. Mediastinum/Nodes: No hilar or mediastinal mass or lymphadenopathy. Lungs/Pleura: Newly seen dependent atelectasis in both lungs, particularly in the lower lobes. No evidence of pulmonary metastatic lesion. Mild emphysema at the lung apices. No pleural effusion. Upper Abdomen: Now evident is extensive metastatic disease throughout the liver. This represents massive and rapid progression. 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 Probable early metastatic disease within the T8 vertebral body possibly extending to the right pedicle, within the T11 and T12 vertebral bodies. Minimal superior endplate depression at 624THL and T12. Cannot accurately assess for tumor in the spinal canal. Probable involvement of the proximal left tenth rib. Definite metastatic disease affecting the right second rib with a surrounding soft tissue mass indenting the pleura. Review of the MIP images confirms the above findings. IMPRESSION: No pulmonary emboli or acute vascular pathology. Massive rapid progression of metastatic disease throughout the liver. Dependent pulmonary atelectasis. Metastatic disease affecting the right second rib with  surrounding soft tissue mass. Likely metastatic disease affecting the proximal left tenth rib. Probable metastatic disease within T8, T11 and T12. Electronically Signed   By: Nelson Chimes M.D.   On: 08/31/2016 14:39   Scheduled Meds: . cefTRIAXone (ROCEPHIN)  IV  2 g Intravenous Q24H  . DULoxetine  30 mg Oral Daily  . [START ON 09/03/2016] fentaNYL  100 mcg Transdermal Q72H  . heparin  5,000 Units Subcutaneous Q8H  . insulin aspart  0-9 Units Subcutaneous TID WC  . metoprolol  25 mg Oral BID  . pantoprazole  40 mg Oral Daily  . sodium chloride flush  3 mL Intravenous  Q12H   Continuous Infusions: . sodium chloride 100 mL/hr at 09/01/16 1128     LOS: 1 day   Kerney Elbe, DO [facility] Pager [phone]  If 7PM-7AM, please contact night-coverage [email or web address] Password [email or web address] 09/01/2016, 1:27 PM

## 2016-09-02 DIAGNOSIS — B9620 Unspecified Escherichia coli [E. coli] as the cause of diseases classified elsewhere: Secondary | ICD-10-CM

## 2016-09-02 DIAGNOSIS — R6520 Severe sepsis without septic shock: Secondary | ICD-10-CM

## 2016-09-02 LAB — COMPREHENSIVE METABOLIC PANEL
ALBUMIN: 2.2 g/dL — AB (ref 3.5–5.0)
ALT: 11 U/L — ABNORMAL LOW (ref 17–63)
ANION GAP: 10 (ref 5–15)
AST: 37 U/L (ref 15–41)
Alkaline Phosphatase: 265 U/L — ABNORMAL HIGH (ref 38–126)
BUN: 7 mg/dL (ref 6–20)
CO2: 22 mmol/L (ref 22–32)
Calcium: 6.2 mg/dL — CL (ref 8.9–10.3)
Chloride: 104 mmol/L (ref 101–111)
Creatinine, Ser: 0.81 mg/dL (ref 0.61–1.24)
GFR calc Af Amer: >60 mL/min (ref >60)
GFR calc non Af Amer: >60 mL/min (ref >60)
GLUCOSE: 103 mg/dL — AB (ref 65–99)
POTASSIUM: 3.4 mmol/L — AB (ref 3.5–5.1)
SODIUM: 136 mmol/L (ref 135–145)
Total Bilirubin: 0.8 mg/dL (ref 0.3–1.2)
Total Protein: 6 g/dL — ABNORMAL LOW (ref 6.5–8.1)

## 2016-09-02 LAB — CBC WITH DIFFERENTIAL/PLATELET
BASOS ABS: 0 10*3/uL (ref 0.0–0.1)
Basophils Relative: 0 %
EOS ABS: 0 10*3/uL (ref 0.0–0.7)
Eosinophils Relative: 1 %
HEMATOCRIT: 23.2 % — AB (ref 39.0–52.0)
HEMOGLOBIN: 7.5 g/dL — AB (ref 13.0–17.0)
LYMPHS ABS: 0.3 10*3/uL — AB (ref 0.7–4.0)
LYMPHS PCT: 9 %
MCH: 28.1 pg (ref 26.0–34.0)
MCHC: 32.3 g/dL (ref 30.0–36.0)
MCV: 86.9 fL (ref 78.0–100.0)
MONOS PCT: 11 %
Monocytes Absolute: 0.4 10*3/uL (ref 0.1–1.0)
NEUTROS ABS: 3.1 10*3/uL (ref 1.7–7.7)
Neutrophils Relative %: 79 %
Platelets: 80 10*3/uL — ABNORMAL LOW (ref 150–400)
RBC: 2.67 MIL/uL — AB (ref 4.22–5.81)
RDW: 15.5 % (ref 11.5–15.5)
WBC Morphology: INCREASED
WBC: 3.8 10*3/uL — AB (ref 4.0–10.5)

## 2016-09-02 LAB — PHOSPHORUS: Phosphorus: 1.8 mg/dL — ABNORMAL LOW (ref 2.5–4.6)

## 2016-09-02 LAB — URINE CULTURE: Culture: >=100000 — AB

## 2016-09-02 LAB — MAGNESIUM: Magnesium: 1.2 mg/dL — ABNORMAL LOW (ref 1.7–2.4)

## 2016-09-02 LAB — GLUCOSE, CAPILLARY
GLUCOSE-CAPILLARY: 118 mg/dL — AB (ref 65–99)
GLUCOSE-CAPILLARY: 113 mg/dL — AB (ref 65–99)
GLUCOSE-CAPILLARY: 128 mg/dL — AB (ref 65–99)
Glucose-Capillary: 101 mg/dL — ABNORMAL HIGH (ref 65–99)

## 2016-09-02 LAB — CALCIUM, IONIZED: CALCIUM, IONIZED, SERUM: 3.5 mg/dL — AB (ref 4.5–5.6)

## 2016-09-02 MED ORDER — MAGNESIUM SULFATE 4 GM/100ML IV SOLN
4.0000 g | Freq: Once | INTRAVENOUS | Status: AC
  Administered 2016-09-02: 4 g via INTRAVENOUS
  Filled 2016-09-02: qty 100

## 2016-09-02 MED ORDER — SODIUM CHLORIDE 0.9 % IV SOLN
30.0000 meq | Freq: Once | INTRAVENOUS | Status: AC
  Administered 2016-09-02: 30 meq via INTRAVENOUS
  Filled 2016-09-02: qty 15

## 2016-09-02 MED ORDER — POTASSIUM PHOSPHATE MONOBASIC 500 MG PO TABS
1000.0000 mg | ORAL_TABLET | Freq: Two times a day (BID) | ORAL | Status: AC
  Administered 2016-09-02 (×2): 1000 mg via ORAL
  Filled 2016-09-02 (×2): qty 2

## 2016-09-02 MED ORDER — BENEPROTEIN PO POWD
1.0000 | Freq: Three times a day (TID) | ORAL | Status: DC
Start: 2016-09-02 — End: 2016-09-02
  Filled 2016-09-02: qty 227

## 2016-09-02 MED ORDER — SODIUM CHLORIDE 0.9 % IV SOLN
1.0000 g | Freq: Once | INTRAVENOUS | Status: AC
  Administered 2016-09-02: 1 g via INTRAVENOUS
  Filled 2016-09-02: qty 10

## 2016-09-02 MED ORDER — UNJURY CHICKEN SOUP POWDER
8.0000 [oz_av] | ORAL | Status: DC
  Administered 2016-09-02 – 2016-09-03 (×3): 8 [oz_av] via ORAL
  Filled 2016-09-02 (×3): qty 27

## 2016-09-02 MED ORDER — MAGNESIUM SULFATE 2 GM/50ML IV SOLN
2.0000 g | Freq: Once | INTRAVENOUS | Status: AC
  Administered 2016-09-02: 2 g via INTRAVENOUS
  Filled 2016-09-02: qty 50

## 2016-09-02 MED ORDER — RESOURCE INSTANT PROTEIN PO PWD PACKET
1.0000 | Freq: Three times a day (TID) | ORAL | Status: DC
  Administered 2016-09-02 – 2016-09-03 (×2): 6 g via ORAL
  Filled 2016-09-02 (×3): qty 6

## 2016-09-02 NOTE — Progress Notes (Signed)
Initial Nutrition Assessment  DOCUMENTATION CODES:   Non-severe (moderate) malnutrition in context of chronic illness  INTERVENTION:   Provide Unjury Chicken Soup once daily, each provides 100 kcal and 21g protein Provide Beneprotein powder TID w/ meals, each provides 25 kcal and 6g protein Encourage PO intake, emphasizing protein RD to continue to monitor  NUTRITION DIAGNOSIS:   Malnutrition related to chronic illness as evidenced by percent weight loss, moderate depletion of body fat, moderate depletions of muscle mass.  GOAL:   Patient will meet greater than or equal to 90% of their needs  MONITOR:   PO intake, Supplement acceptance, Labs, Weight trends, I & O's, Skin  REASON FOR ASSESSMENT:   Malnutrition Screening Tool    ASSESSMENT:   61 y.o. male with medical history significant of metastatic bladder cancer, who presents with the chief complain of chest wall pain. For the last 4 days he has been more confused, weak and disorientated.  On high dose of opiates for pain control. Dose of fentanyl recently increased to 125 mcg but due to confusion patient's wife continue 100 mcg patch. Today patient had significant chest wall pain, dull in nature, with no radiation, worse with movement, associated with diaphoresis, 10/10 in intensity with no improving factors. Mainly localized on the right upper chest. Due to severity of symptoms patient was brought to the hospital for further evaluation.  Last hospitalization on 06/3016, for postobstructive uropathy, had bilateral nephrostomy tubes placed. Tubes are being changed on a regular bases, patient started recently to pass urine through the bladder.   Patient in room with wife at bedside. Pt reports good appetite and eating most of his meals. Pt ate a tuna sandwich today. PO intakes have ranged from 90-100% completion. Pt states he was eating no differently at home. However, he continues to lose weight. Denies chewing or swallowing  issues. States he does have some taste changes with certain foods. Pt states he does not like sweet supplements like Ensure or Boost. He is willing to try Unjury Chicken soup and Beneprotein powders. UBW is 244 lb. Pt has lost 51 lb since 07/13/16 (23% wt loss x <2 months, significant for time frame). Nutrition-Focused physical exam completed. Findings are moderate fat depletion, moderate muscle depletion, and no edema.    Medications: K-Phos tablet BID  Labs reviewed: CBGs: 113-118 Low K, Phos, Mg  Diet Order:  Diet heart healthy/carb modified Room service appropriate? Yes; Fluid consistency: Thin  Skin:  Wound (see comment) (MSAD on scrotum)  Last BM:  1/20  Height:   Ht Readings from Last 1 Encounters:  08/31/16 5\' 11"  (1.803 m)    Weight:   Wt Readings from Last 1 Encounters:  08/31/16 168 lb (76.2 kg)    Ideal Body Weight:  78.2 kg  BMI:  Body mass index is 23.43 kg/m.  Estimated Nutritional Needs:   Kcal:  2100-2300  Protein:  110-120g  Fluid:  2.1-2.3L/day  EDUCATION NEEDS:   Education needs addressed  Clayton Bibles, MS, RD, LDN Pager: [phone] After Hours Pager: [phone]

## 2016-09-02 NOTE — Progress Notes (Signed)
CRITICAL VALUE ALERT  Critical value received:  Calcium 6.2  Date of notification:  09/02/16   Time of notification:  0623  Critical value read back:Yes.    Nurse who received alert:  Mena Pauls   MD notified (1st page):  Olevia Bowens   Time of first page:  0625  MD notified (2nd page):  Time of second page:  Responding MD:  Olevia Bowens   Time MD responded:  [id number]

## 2016-09-02 NOTE — Consult Note (Signed)
Consultation Note Date: 09/02/2016   Patient Name: Alexander Wolf  DOB: 1956/01/17  MRN: [id number]  Age / Sex: 61 y.o., male  PCP: Shawnee Knapp, MD Referring Physician: Kerney Elbe, DO  Reason for Consultation: Establishing goals of care  HPI/Patient Profile: 61 y.o. male  with past medical history of Her cancer, diabetes mellitus, CAD status post CABG, hypertension, hyperlipidemia admitted on 08/31/2016 with chest pain, altered mental status and weakness. He was found to be septic from likely urinary source. He has rapidly progressive bladder cancer with bilateral hydronephrosis secondary to obstructive uropathy with uremia. He has percutaneous nephrostomy tubes. These are changed regularly. He continues to have pain in his left leg and lower abdomen in addition to new chest pain. I consulted for continuation of discussion for goals of care in light of his bladder cancer as well as pain management.  Clinical Assessment and Goals of Care: I met today with Alexander Wolf and his wife. I know him from a prior admission and he appears to be a little more confused during this encounter.  During her last encounter, he avoided questions regarding his exact understanding of his cancer and this seems to continue today. His wife has very good insight into the severity of his condition. She is a Marine scientist at a GI office.  She reports that he continues to have decline in his functional status at home. He has missed several radiation treatments secondary to pain, difficulty ambulating, and the recent weather. Her report of his current condition is a largely bedbound existence. He can sometimes make it to the bathroom on his own, but the vast majority of his days are spent in their bed.  He continues to report pain in his legs, lower abdomen, and chest. He'll often get up to an 8 out of 10, dull and achy in character. It is worse lying  in certain positions and improves with shifting in bed and pain medication. He is currently prescribed a fentanyl patch 100 g. This was supposed to be increased to 125 g, however, he became more confused and his wife has been using 100 g patch. Additionally, he has been taking oxycodone for breakthrough pain. His current prescription is for 15 mg tablets. He will often take 2 of these tablets at a time and reports that he will often need 2 of the tablets in order to have adequate relief of his pain.    SUMMARY OF RECOMMENDATIONS   - DO NOT RESUSCITATE/DO NOT INTUBATE - [name] has been invested in plan to continue to pursue disease modifying therapy. His wife is concerned because it seems that his functional status is continuing to decline despite therapy. She reports a largely bedbound existence at this time. His wife has been trying to balance caring for him with the need to continue working to support them financially (In talking with her, I think she may feel guilty because she is not able to be with him all day).  They both want to continue any therapy  that will add time and quality to his life, but it continues to be more difficult for him to get out of the house.  His wife realizes that there are burdens associated with continued trips for radiation therapy and admissions to the hospital, and she is trying to figure out how to balance this with the potential benefit in light of his noncurable disease. - Pain: He continues to report being in pain. He states that at home he will use 2 of his 15 mg oxycodone tablets at a time when the pain is severe. In talking with him, it appears that his exacerbations are often related incident pain which is very difficult to treat. Overall, he and his wife are clear in his goal to be as pain-free as possible and understand risks associated with increased doses of narcotic pain medication. Plan to continue fentanyl 100 g patch (he did not increase to 125 g) and  will increase rescue pain medication to oxycodone 15-30 mg every 3 hours as needed. He also has a low-dose IV morphine ordered for breakthrough pain as well. While his current kidney function is normal, he has history of renal impairment and will therefore transition IV medication to Dilaudid. - Plan for follow-up meeting tomorrow with his wife  Code Status/Advance Care Planning:  DNR   Symptom Management:   As above  Palliative Prophylaxis:   Frequent Pain Assessment  Psycho-social/Spiritual:   Desire for further Chaplaincy support: Not addressed today  Additional Recommendations: Caregiving  Support/Resources  Prognosis:   His functional status continues to deteriorate. He is now spending greater than 50% each day in bed and the amount of time and that is increasing. I suspect his prognosis is less than 6 months if his disease follows its natural course, and he should qualify for hospice if so desired at any point in the future.  Discharge Planning: To Be Determined      Primary Diagnoses: Present on Admission: . Severe sepsis ([place]) . Sepsis ([place]) . Anemia of chronic disease . Essential hypertension . Hyperlipidemia LDL goal <70 . Malignant neoplasm of posterior wall of urinary bladder ([place]) . Elevated lactic acid level . Neoplasm related pain   I have reviewed the medical record, interviewed the patient and family, and examined the patient. The following aspects are pertinent.  Past Medical History:  Diagnosis Date  . Bladder cancer ([name]) 2014   [facility] in [place], [place]; /  urologist in [place]-  dr Diona Fanti; lost to f/u, redeveloped problems in 7/17  . BPH (benign prostatic hypertrophy) with urinary retention   . Coronary artery disease    s/p CABG  (per pt hasn't seen cardiologist for several years)  . Foley catheter in place   . GERD (gastroesophageal reflux disease)   . Hyperlipidemia   . Hypertension   . Insomnia   . 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  Type 2 diabetes mellitus ([place])    Social History   Social History  . Marital status: Married    Spouse name: N/A  . Number of children: 3  . Years of education: N/A   Occupational History  . unemployed    Social History Main Topics  . Smoking status: Former Smoker    Packs/day: 1.00    Years: 44.00    Types: Cigarettes    Quit date: 03/22/2012  . Smokeless tobacco: Never Used  . Alcohol use No  . Drug use: No  . Sexual activity: Yes  Other Topics Concern  . None   Social History Narrative   Marital status: married x 12 years; second marriage.      Children: 3 children; 3 grandchildren; no gg      Lives: with wife      Employment:  Building control surveyor; Architect; 40 hours per week      Tobacco: quit in 3 years ago in 2013; smoked 45 years      Alcohol:  None      Drugs:  None      Exercise: works hard every day      Seatbelt:  100%; no texting while driving        Family History  Problem Relation Age of Onset  . Varicose Veins Mother   . Cancer Father     lung  . Cancer Maternal Grandmother     breast  . Cancer Paternal Grandmother     brain   Scheduled Meds: . calcium gluconate  1 g Intravenous Once  . cefTRIAXone (ROCEPHIN)  IV  2 g Intravenous Q24H  . DULoxetine  30 mg Oral Daily  . [START ON 09/03/2016] fentaNYL  100 mcg Transdermal Q72H  . heparin  5,000 Units Subcutaneous Q8H  . insulin aspart  0-9 Units Subcutaneous TID WC  . magnesium sulfate 1 - 4 g bolus IVPB  4 g Intravenous Once  . metoprolol  25 mg Oral BID  . pantoprazole  40 mg Oral Daily  . potassium phosphate (monobasic)  1,000 mg Oral BID WC  . sodium chloride flush  3 mL Intravenous Q12H   Continuous Infusions: . sodium chloride 100 mL/hr at 09/01/16 1128   PRN Meds:.acetaminophen **OR** acetaminophen, docusate sodium, HYDROmorphone (DILAUDID) injection, ondansetron **OR** ondansetron (ZOFRAN) IV, oxyCODONE, sodium chloride flush, zolpidem Medications Prior to Admission:  Prior to Admission  medications   Medication Sig Start Date End Date Taking? Authorizing Provider  docusate sodium (COLACE) 100 MG capsule Take 1 capsule (100 mg total) by mouth 2 (two) times daily as needed for mild constipation. 04/07/16  Yes Shawnee Knapp, MD  DULoxetine (CYMBALTA) 30 MG capsule Take 1 capsule (30 mg total) by mouth daily. 08/14/16  Yes Wyatt Portela, MD  fentaNYL (DURAGESIC - DOSED MCG/HR) 100 MCG/HR Place 1 patch (100 mcg total) onto the skin every 3 (three) days. 08/27/16  Yes Tyler Pita, MD  fentaNYL (DURAGESIC - DOSED MCG/HR) 25 MCG/HR patch Place 1 patch (25 mcg total) onto the skin every 3 (three) days. 08/27/16  Yes Tyler Pita, MD  glipiZIDE (GLUCOTROL) 5 MG tablet TAKE 1 TABLET BY MOUTH TWICE DAILY BEFORE A MEAL 07/15/16  Yes Shawnee Knapp, MD  lidocaine-prilocaine (EMLA) cream Apply to port before chemotherapy. 04/10/16  Yes Wyatt Portela, MD  lisinopril (PRINIVIL,ZESTRIL) 20 MG tablet  07/11/16  Yes Historical Provider, MD  metoprolol (LOPRESSOR) 50 MG tablet TAKE 1/2 TABLET BY MOUTH TWICE DAILY 06/13/16  Yes Shawnee Knapp, MD  omeprazole (PRILOSEC) 40 MG capsule TAKE 1 CAPSULE BY MOUTH EVERY DAY 03/29/16  Yes Wardell Honour, MD  oxyCODONE (ROXICODONE) 15 MG immediate release tablet Take 1 tablet (15 mg total) by mouth every 3 (three) hours as needed. 08/27/16  Yes Wyatt Portela, MD  zolpidem (AMBIEN) 10 MG tablet TAKE 1 TABLET BY MOUTH EVERY NIGHT AT BEDTIME AS NEEDED FOR SLEEP 06/05/16  Yes Shawnee Knapp, MD  atorvastatin (LIPITOR) 40 MG tablet Take 1 tablet (40 mg total) by mouth daily. Patient not taking: Reported on 08/27/2016  03/29/16   Wardell Honour, MD  blood glucose meter kit and supplies KIT Dispense based on patient and insurance preference. Use up to four times daily as directed. (FOR ICD-9 250.00, 250.01). 08/22/16   Shawnee Knapp, MD  lidocaine (XYLOCAINE) 5 % ointment Apply 1 application topically as needed. Patient not taking: Reported on 08/31/2016 06/29/16   Shawnee Knapp, MD   No Known  Allergies Review of Systems  Constitutional: Positive for activity change, appetite change and fatigue.  Gastrointestinal: Positive for diarrhea.  Musculoskeletal: Positive for back pain and neck pain.  Neurological: Positive for weakness.  Psychiatric/Behavioral: Positive for sleep disturbance.    Physical Exam  General: Alert, awake, in no acute distress. Chronically ill-appearing HEENT: No bruits, no goiter, no JVD Heart: Regular rate and rhythm. No murmur appreciated. Lungs: Good air movement, clear Abdomen: Soft, nontender, nondistended, positive bowel sounds.  Skin: Warm and dry Neuro: Grossly intact, nonfocal.  Vital Signs: BP 122/83 (BP Location: Left Arm)   Pulse 99   Temp 97.9 F (36.6 C) (Oral)   Resp 20   Ht _0  (1.803 m)   Wt 76.2 kg (168 lb)   SpO2 99%   BMI 23.43 kg/m  Pain Assessment: 0-10 POSS *See Group Information*: S-Acceptable,Sleep, easy to arouse Pain Score: Asleep   SpO2: SpO2: 99 % O2 Device:SpO2: 99 % O2 Flow Rate: .   IO: Intake/output summary:  Intake/Output Summary (Last 24 hours) at 09/02/16 0641 Last data filed at 09/02/16 3267  Gross per 24 hour  Intake          4000.83 ml  Output             3205 ml  Net           795.83 ml    LBM: Last BM Date: 09/01/16 Baseline Weight: Weight: 78.9 kg (174 lb) Most recent weight: Weight: 76.2 kg (168 lb)     Palliative Assessment/Data:   Flowsheet Rows   Flowsheet Row Most Recent Value  Intake Tab  Referral Department  -- [ED]  Unit at Time of Referral  ER  Palliative Care Primary Diagnosis  Cancer  Date Notified  08/31/16  Palliative Care Type  Return patient Palliative Care  Reason for referral  Clarify Goals of Care, Counsel Regarding Hospice  Date of Admission  08/31/16  Date first seen by Palliative Care  09/01/16  # of days Palliative referral response time  1 Day(s)  # of days IP prior to Palliative referral  0  Clinical Assessment  Palliative Performance Scale Score  40%   Pain Max last 24 hours  8  Pain Min Last 24 hours  0  Psychosocial & Spiritual Assessment  Palliative Care Outcomes  Patient/Family meeting held?  Yes  Who was at the meeting?  Patient, Wife  Palliative Care Outcomes  Counseled regarding hospice     Time Total: 80 Greater than 50%  of this time was spent counseling and coordinating care related to the above assessment and plan.  Signed by: Micheline Rough, MD   Please contact Palliative Medicine Team phone at [phone] for questions and concerns.  For individual provider: See Shea Evans

## 2016-09-02 NOTE — Progress Notes (Signed)
PROGRESS NOTE    Alexander Wolf  [id number] DOB: 02-10-1956 DOA: 08/31/2016 PCP: [name], MD   Brief Narrative:  Alexander Wolf is a 61 y.o. male with medical history significant of metastatic bladder cancer, who presents with the chief complain of chest wall pain. For the last 4 days he has been more confused, weak and disorientated.  On high dose of opiates for pain control. Dose of fentanyl recently increased to 125 mcg but due to confusion patient's wife continue 100 mcg patch. Today patient had significant chest wall pain, dull in nature, with no radiation, worse with movement, associated with diaphoresis, 10/10 in intensity with no improving factors. Mainly localized on the right upper chest. Due to severity of symptoms patient was brought to the hospital for further evaluation.  Last hospitalization on 06/3016, for postobstructive uropathy, had bilateral nephrostomy tubes placed. Tubes are being changed on a regular bases, patient started recently to pass urine through the bladder. Was worked up and found to be septic and suspected from [place]. Currently being treated for UTI. Palliative Care Medicine consulted and Oncology notified by EPIC.   Assessment & Plan:   Principal Problem:   Sepsis ([place]) Active Problems:   Essential hypertension   Hyperlipidemia LDL goal <70   Hx of bladder cancer   Malignant neoplasm of posterior wall of urinary bladder (HCC)   Elevated lactic acid level   Anemia of chronic disease   Neoplasm related pain   Severe sepsis (HCC)   UTI (urinary tract infection)  Sepsis likely 2/2 to Urinary Tract Infection, improving  -Patient was Tachycardic, Tachypenic and Confused on Admission with suspected source of infection being urine -S/P Fluid Boluses of 3.5 Liters; Maintenance Fluid now at 100 mL/hr -Urinalysis showed Many Bacteria, Moderate Leukocytes, Negative Nitrites, and TNTC WBC;  -Urine Cx showed >100,000 Colonies of E. Coli and >100,000 CFU of  Group B Strep; E Coli Sensitive to Ceftriaxone -Blood Cx x 2 showed NGTD at 2 Days -C/w IVF at 100 mL/hr and IV Abx with Ceftriaxone 2 gram q24h -Last Hospitalization on 07/12/16 for Postiobstructive Uropathy s/p Bilateral Nephrostomy Tubes -Procalcitonin was 0.56 and Trend Lactic Acid Level were elevated and were 2.2 -Continue to Monitor extremely closely  -Palliative Care to Meet with Family and Patient today   Acute Encephalopathy, improving -Likely Toxic in Nature from UTI -Improving; Has been confused, weak, and disoriented for the last 4 days -Recent Dose of Fentanyl Patch was increased to 125 mcg; May also be contributing -Continue to Monitor -Head CT showed no acute intracranial abnormality and cerebral atrophy  Lactic Acidosis -LA went from 3.37 -> 2.3 -> 2.2 -C/w IVF Rehydration with NS at 100 mL/hr -Continue to Monitor Lactic Acid Levels  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 Massive rapid progression of metastatic disease throughout the liver. Dependent pulmonary atelectasis. Metastatic disease affecting the right second rib with surrounding soft tissue mass. Likely metastatic disease affecting the proximal left tenth rib. Probable metastatic disease within T8, T11 and T12. -Pain Control with Oxycodone 15 mg po q3hPRN, Morphine 1 mg po q6hprn -Continue Bowel Regimen and Antiemetics -Palliative Care Consulted for Goals of Care and Pain Control; Will meet with patient and wife today.  -Will Notify Oncology Dr. Alen Blew of Patient's Admission via EPIC  Chest Wall Pain -Likely From Metastasis; CT Chest Angio showed metastatic disease affecting the Right second  rib with surrounding  sof tissue mass. Likely metastatic disease affecting the proximal left tenth rib.   Anemia likley of Chronic Disease from Bladder  Cancer -Patient's Hb/Hct went from 9.1/27.0 -> 7.8/23.9 -> 7.5/23.2 -Possibly from Cancer in combination with Dilution from Fluid  Hypokalemia -Potassium 3.4 this AM -Replete with 40 mEQ po KCl -Continue to Monitor BMP and Replete as necessary  Hypophosphatemia -Patients Phos Level was 1.8 -Replete with Kphos -Repeat Phos Level in AM  Hypomagnesemia -Mag level was 1.2 -Replete -Repeat Mag Level in AM  Hypertension.  -Lisinopril held on Admission -C/w Metoprolol Tartrate 25 mg po BID  Type 2 Diabetes Mellitus. -Will hold Home Oral hypoglycemic agents -Continue with Sensitive Novolog SSI  -CBG's ranging 101-128  Dyslipidemia.  -Considering patient's condition and poor prognosis Atorvastatin discontinued.   Depression -Continue Cymbalta at 30 mg po Daily   GERD -Continue Pantoprazole 40 mg po Daily  CAD with Hx of CABG x4 in 2005 -Has not followed up with Cardiology for several years -C/w Metoprolol; Atorvastatin D/C'd on Admission  DVT prophylaxis: Heparin 5,000 units sq q8h Code Status: DO NOT RESUSCITATE Family Communication: No family present at bedside Disposition Plan: Remain Inpatient; Will obtain PT Evaluation  Consultants:   Palliative Care Medicine  Oncology Notified via EPIC  Procedures: None  Antimicrobials:  Anti-infectives    Start     Dose/Rate Route Frequency Ordered Stop   09/01/16 0400  ceFEPIme (MAXIPIME) 2 g in dextrose 5 % 50 mL IVPB  Status:  Discontinued     2 g 100 mL/hr over 30 Minutes Intravenous Every 12 hours 08/31/16 1539 08/31/16 1752   09/01/16 0400  cefTRIAXone (ROCEPHIN) 2 g in dextrose 5 % 50 mL IVPB     2 g 100 mL/hr over 30 Minutes Intravenous Every 24 hours 08/31/16 1807     08/31/16 1515  ceFEPIme (MAXIPIME) 2 g in dextrose 5 % 50 mL IVPB     2 g 100 mL/hr over 30 Minutes Intravenous  Once 08/31/16 1509 08/31/16 1611     Subjective: Seen and examined bedside this AM and had no complaints. States he was doing  good and nephrostomy tubes were draining. No Nausea or Vomiting today. Palliative Care to meet with Family today.    Objective: Vitals:   09/01/16 2101 09/02/16 0648 09/02/16 0902 09/02/16 1344  BP: 122/83 118/63 122/86 134/82  Pulse: 99 99 99 88  Resp: 20 (!) 22  20  Temp: 97.9 F (36.6 C) 98.2 F (36.8 C)  97.5 F (36.4 C)  TempSrc: Oral Oral  Oral  SpO2: 99% 96%  100%  Weight:      Height:        Intake/Output Summary (Last 24 hours) at 09/02/16 1717 Last data filed at 09/02/16 1330  Gross per 24 hour  Intake             1830 ml  Output             3575 ml  Net            -1745 ml   Filed Weights   08/31/16 1254 08/31/16 1529  Weight: 78.9 kg (174 lb) 76.2 kg (168 lb)   Examination: Physical Exam:  Constitutional: ill-appearing 61 year old male who appears older than stated age. Currently in NAD Eyes:  Lids and conjunctivae normal, sclerae anicteric  ENMT: External Ears, Nose appear normal. Grossly normal hearing. Neck: Appears normal, supple, no cervical masses, normal ROM, no appreciable thyromegaly; no  visible JVD Respiratory: Clear to auscultation bilaterally, no wheezing, rales, rhonchi or crackles. Normal respiratory effort and patient is not tachypenic. No accessory muscle use.  Chest Wall: Chemoport in Place Cardiovascular: RRR, no murmurs / rubs / gallops. S1 and S2 auscultated. No extremity edema.  Abdomen: Soft, non-tender, non-distended. No masses palpated. No appreciable hepatosplenomegaly. Bowel sounds positive.  GU: Deferred. Has bilateral Nephrostomy tubes that are draining well Musculoskeletal: No clubbing / cyanosis of digits/nails. No joint deformity upper and lower extremities. Skin: No rashes, lesions, ulcers on limited skin eval. No induration; Warm and dry.  Neurologic: CN 2-12 grossly intact with no focal deficits. Sensation intact in all 4 Extremities, Romberg sign cerebellar reflexes not assessed.  Psychiatric: Normal judgment and insight.  Alert and awake. Normal mood and appropriate affect.   Data Reviewed: I have personally reviewed following labs and imaging studies  CBC:  Recent Labs Lab 08/31/16 1303 08/31/16 2040 09/01/16 0419 09/02/16 0530  WBC 5.4 3.9* 4.0 3.8*  NEUTROABS  --   --   --  3.1  HGB 9.1* 7.6* 7.8* 7.5*  HCT 27.0* 23.6* 23.9* 23.2*  MCV 83.9 85.8 86.3 86.9  PLT 122* 95* 96* 80*   Basic Metabolic Panel:  Recent Labs Lab 08/31/16 1303 08/31/16 2040 09/01/16 0419 09/02/16 0530  NA 133*  --  135 136  K 3.2*  --  3.4* 3.4*  CL 96*  --  103 104  CO2 20*  --  23 22  GLUCOSE 219*  --  87 103*  BUN 16  --  10 7  CREATININE 1.12 0.87 0.83 0.81  CALCIUM 6.7*  --  6.2* 6.2*  MG  --   --   --  1.2*  PHOS  --   --   --  1.8*   GFR: Estimated Creatinine Clearance: 103.3 mL/min (by C-G formula based on SCr of 0.81 mg/dL). Liver Function Tests:  Recent Labs Lab 08/31/16 1303 09/01/16 0419 09/02/16 0530  AST 36 37 37  ALT 13* 10* 11*  ALKPHOS 277* 230* 265*  BILITOT 0.6 0.5 0.8  PROT 7.1 5.9* 6.0*  ALBUMIN 2.9* 2.3* 2.2*   No results for input(s): LIPASE, AMYLASE in the last 168 hours. No results for input(s): AMMONIA in the last 168 hours. Coagulation Profile: No results for input(s): INR, PROTIME in the last 168 hours. Cardiac Enzymes: No results for input(s): CKTOTAL, CKMB, CKMBINDEX, TROPONINI in the last 168 hours. BNP (last 3 results) No results for input(s): PROBNP in the last 8760 hours. HbA1C: No results for input(s): HGBA1C in the last 72 hours. CBG:  Recent Labs Lab 09/01/16 1644 09/01/16 2224 09/02/16 0739 09/02/16 1143 09/02/16 1649  GLUCAP 100* 101* 118* 113* 128*   Lipid Profile: No results for input(s): CHOL, HDL, LDLCALC, TRIG, CHOLHDL, LDLDIRECT in the last 72 hours. Thyroid Function Tests: No results for input(s): TSH, T4TOTAL, FREET4, T3FREE, THYROIDAB in the last 72 hours. Anemia Panel: No results for input(s): VITAMINB12, FOLATE, FERRITIN, TIBC,  IRON, RETICCTPCT in the last 72 hours. Sepsis Labs:  Recent Labs Lab 09/01/16 0000 09/01/16 1050 09/01/16 1500 09/01/16 1620 09/01/16 1815  PROCALCITON  --   --   --  0.56  --   LATICACIDVEN 2.1* 2.3* 2.3*  --  2.2*    Recent Results (from the past 240 hour(s))  Urine culture     Status: Abnormal   Collection Time: 08/31/16  2:45 PM  Result Value Ref Range Status   Specimen Description URINE, CATHETERIZED  Final   Special Requests NONE  Final   Culture (A)  Final    >=100,000 COLONIES/mL ESCHERICHIA COLI >=100,000 COLONIES/mL GROUP B STREP(S.AGALACTIAE)ISOLATED TESTING AGAINST S. AGALACTIAE NOT ROUTINELY PERFORMED DUE TO PREDICTABILITY OF AMP/PEN/VAN SUSCEPTIBILITY. Performed at [facility], [place] [address], [place], [place] [address]    Report Status 09/02/2016 FINAL  Final   Organism ID, Bacteria ESCHERICHIA COLI (A)  Final      Susceptibility   Escherichia coli - MIC*    AMPICILLIN 16 INTERMEDIATE Intermediate     CEFAZOLIN <=4 SENSITIVE Sensitive     CEFTRIAXONE <=1 SENSITIVE Sensitive     CIPROFLOXACIN 1 SENSITIVE Sensitive     GENTAMICIN <=1 SENSITIVE Sensitive     IMIPENEM <=0.25 SENSITIVE Sensitive     NITROFURANTOIN <=16 SENSITIVE Sensitive     TRIMETH/SULFA <=20 SENSITIVE Sensitive     AMPICILLIN/SULBACTAM 4 SENSITIVE Sensitive     PIP/TAZO <=4 SENSITIVE Sensitive     Extended ESBL NEGATIVE Sensitive     * >=100,000 COLONIES/mL ESCHERICHIA COLI  Blood Culture (routine x 2)     Status: None (Preliminary result)   Collection Time: 08/31/16  3:24 PM  Result Value Ref Range Status   Specimen Description BLOOD RIGHT ANTECUBITAL  Final   Special Requests BOTTLES DRAWN AEROBIC AND ANAEROBIC 5CC  Final   Culture   Final    NO GROWTH 2 DAYS Performed at [facility], [address] [place], [place] [address]    Report Status PENDING  Incomplete  Blood Culture (routine x 2)     Status: None (Preliminary result)   Collection Time: 08/31/16  3:28 PM   Result Value Ref Range Status   Specimen Description BLOOD RIGHT HAND  Final   Special Requests IN PEDIATRIC BOTTLE 4CC  Final   Culture   Final    NO GROWTH 2 DAYS Performed at [facility], [place] [address], [place], [place] [address]    Report Status PENDING  Incomplete    Radiology Studies: No results found. Scheduled Meds: . cefTRIAXone (ROCEPHIN)  IV  2 g Intravenous Q24H  . DULoxetine  30 mg Oral Daily  . [START ON 09/03/2016] fentaNYL  100 mcg Transdermal Q72H  . heparin  5,000 Units Subcutaneous Q8H  . insulin aspart  0-9 Units Subcutaneous TID WC  . metoprolol  25 mg Oral BID  . pantoprazole  40 mg Oral Daily  . potassium phosphate (monobasic)  1,000 mg Oral BID WC  . protein supplement  1 scoop Oral TID WC  . protein supplement  8 oz Oral Q24H  . sodium chloride flush  3 mL Intravenous Q12H   Continuous Infusions: . sodium chloride 100 mL/hr at 09/01/16 1128     LOS: 2 days   Kerney Elbe, DO [facility] Pager [phone]  If 7PM-7AM, please contact night-coverage [email or web address] Password [facility] 09/02/2016, 5:17 PM

## 2016-09-02 NOTE — Progress Notes (Signed)
NO CHARGE NOTE  Met briefly with Mr. Buschman and his wife.  They would like to talk to Dr. Shadad prior to making any further decisions regarding long term goals of care.  Will plan to f/u tomorrow.   , MD [place] [facility] Team [phone]  

## 2016-09-03 ENCOUNTER — Inpatient Hospital Stay (HOSPITAL_COMMUNITY): Payer: 59

## 2016-09-03 ENCOUNTER — Ambulatory Visit: Payer: 59

## 2016-09-03 ENCOUNTER — Ambulatory Visit
Admit: 2016-09-03 | Discharge: 2016-09-03 | Disposition: A | Discharge status: Home / Self Care | Payer: 59 | Attending: Radiation Oncology | Admitting: Radiation Oncology

## 2016-09-03 DIAGNOSIS — C675 Malignant neoplasm of bladder neck: Secondary | ICD-10-CM

## 2016-09-03 DIAGNOSIS — C7989 Secondary malignant neoplasm of other specified sites: Secondary | ICD-10-CM

## 2016-09-03 DIAGNOSIS — C7951 Secondary malignant neoplasm of bone: Secondary | ICD-10-CM

## 2016-09-03 DIAGNOSIS — A419 Sepsis, unspecified organism: Principal | ICD-10-CM

## 2016-09-03 DIAGNOSIS — K5909 Other constipation: Secondary | ICD-10-CM

## 2016-09-03 DIAGNOSIS — R079 Chest pain, unspecified: Secondary | ICD-10-CM

## 2016-09-03 LAB — COMPREHENSIVE METABOLIC PANEL
ALT: 12 U/L — AB (ref 17–63)
AST: 41 U/L (ref 15–41)
Albumin: 2.2 g/dL — ABNORMAL LOW (ref 3.5–5.0)
Alkaline Phosphatase: 289 U/L — ABNORMAL HIGH (ref 38–126)
Anion gap: 11 (ref 5–15)
BUN: 8 mg/dL (ref 6–20)
CHLORIDE: 103 mmol/L (ref 101–111)
CO2: 22 mmol/L (ref 22–32)
CREATININE: 0.7 mg/dL (ref 0.61–1.24)
Calcium: 6.6 mg/dL — ABNORMAL LOW (ref 8.9–10.3)
GFR calc Af Amer: >60 mL/min (ref >60)
Glucose, Bld: 96 mg/dL (ref 65–99)
Potassium: 3.5 mmol/L (ref 3.5–5.1)
Sodium: 136 mmol/L (ref 135–145)
Total Bilirubin: 0.5 mg/dL (ref 0.3–1.2)
Total Protein: 6.3 g/dL — ABNORMAL LOW (ref 6.5–8.1)

## 2016-09-03 LAB — CBC WITH DIFFERENTIAL/PLATELET
BASOS PCT: 0 %
Basophils Absolute: 0 10*3/uL (ref 0.0–0.1)
EOS PCT: 1 %
Eosinophils Absolute: 0 10*3/uL (ref 0.0–0.7)
HEMATOCRIT: 23.9 % — AB (ref 39.0–52.0)
Hemoglobin: 7.8 g/dL — ABNORMAL LOW (ref 13.0–17.0)
LYMPHS ABS: 0.5 10*3/uL — AB (ref 0.7–4.0)
Lymphocytes Relative: 11 %
MCH: 28.3 pg (ref 26.0–34.0)
MCHC: 32.6 g/dL (ref 30.0–36.0)
MCV: 86.6 fL (ref 78.0–100.0)
MONO ABS: 0.5 10*3/uL (ref 0.1–1.0)
Monocytes Relative: 11 %
Neutro Abs: 3.3 10*3/uL (ref 1.7–7.7)
Neutrophils Relative %: 77 %
Platelets: 85 10*3/uL — ABNORMAL LOW (ref 150–400)
RBC: 2.76 MIL/uL — ABNORMAL LOW (ref 4.22–5.81)
RDW: 15.4 % (ref 11.5–15.5)
WBC: 4.3 10*3/uL (ref 4.0–10.5)

## 2016-09-03 LAB — PROCALCITONIN: Procalcitonin: 0.62 ng/mL

## 2016-09-03 LAB — PHOSPHORUS: PHOSPHORUS: 1.5 mg/dL — AB (ref 2.5–4.6)

## 2016-09-03 LAB — GLUCOSE, CAPILLARY
Glucose-Capillary: 94 mg/dL (ref 65–99)
Glucose-Capillary: 122 mg/dL — ABNORMAL HIGH (ref 65–99)
Glucose-Capillary: 101 mg/dL — ABNORMAL HIGH (ref 65–99)
Glucose-Capillary: 126 mg/dL — ABNORMAL HIGH (ref 65–99)

## 2016-09-03 LAB — MAGNESIUM: Magnesium: 2 mg/dL (ref 1.7–2.4)

## 2016-09-03 MED ORDER — PREMIER PROTEIN SHAKE
11.0000 [oz_av] | ORAL | Status: DC
  Administered 2016-09-03 – 2016-09-04 (×2): 11 [oz_av] via ORAL
  Filled 2016-09-03 (×2): qty 325.31

## 2016-09-03 MED ORDER — CEFAZOLIN IN D5W 1 GM/50ML IV SOLN
1.0000 g | Freq: Two times a day (BID) | INTRAVENOUS | Status: DC
  Administered 2016-09-03 – 2016-09-04 (×2): 1 g via INTRAVENOUS
  Filled 2016-09-03 (×3): qty 50

## 2016-09-03 MED ORDER — HYDROMORPHONE HCL 1 MG/ML IJ SOLN
0.5000 mg | Freq: Once | INTRAMUSCULAR | Status: AC
  Administered 2016-09-03: 0.5 mg via INTRAVENOUS
  Filled 2016-09-03: qty 0.5

## 2016-09-03 MED ORDER — HYDROMORPHONE HCL 2 MG/ML IJ SOLN
1.0000 mg | INTRAMUSCULAR | Status: DC | PRN
  Administered 2016-09-03 – 2016-09-04 (×4): 1 mg via INTRAVENOUS
  Filled 2016-09-03 (×5): qty 1

## 2016-09-03 MED ORDER — HYDROMORPHONE HCL 1 MG/ML IJ SOLN
1.0000 mg | INTRAMUSCULAR | Status: DC | PRN
  Administered 2016-09-03 (×2): 1 mg via INTRAVENOUS
  Filled 2016-09-03 (×2): qty 1

## 2016-09-03 NOTE — Progress Notes (Signed)
IP PROGRESS NOTE  Subjective:   [name] known to me with history of metastatic bladder cancer. His disease progressed while on systemic chemotherapy. He is currently receiving palliative radiation therapy.  He was hospitalized on 08/31/2016 after presenting with symptoms of increased pain and sepsis. CT scan of the chest showed no pulmonary embolism but rapid progression of his disease.  Clinically he is improved in the last 48 hours. He is not reporting any increased pain or difficulty breathing. His mobility still limited and mostly bedbound.  Objective:  Vital signs in last 24 hours: Temp:  [97.5 F (36.4 C)-98.2 F (36.8 C)] 97.7 F (36.5 C) (01/22 0649) Pulse Rate:  [88-99] 99 (01/22 0649) Resp:  [20] 20 (01/22 0649) BP: (119-134)/(77-86) 119/82 (01/22 0649) SpO2:  [96 %-100 %] 97 % (01/22 0649) Weight change:  Last BM Date: 09/02/16  Intake/Output from previous day: 01/21 0701 - 01/22 0700 In: 2235 [P.O.:720; I.V.:1200; IV Piggyback:315] Out: [id number] [Urine:3425] Alert, awake gentleman appeared without distress. Mouth: mucous membranes moist, pharynx normal without lesions Resp: clear to auscultation bilaterally Cardio: regular rate and rhythm, S1, S2 normal, no murmur, click, rub or gallop GI: soft, non-tender; bowel sounds normal; no masses,  no organomegaly Extremities: extremities normal, atraumatic, no cyanosis or edema  Portacath/PICC-without erythema  Lab Results:  Recent Labs  09/02/16 0530 09/03/16 0344  WBC 3.8* 4.3  HGB 7.5* 7.8*  HCT 23.2* 23.9*  PLT 80* 85*    BMET  Recent Labs  09/02/16 0530 09/03/16 0344  NA 136 136  K 3.4* 3.5  CL 104 103  CO2 22 22  GLUCOSE 103* 96  BUN 7 8  CREATININE 0.81 0.70  CALCIUM 6.2* 6.6*    Studies/Results: No results found.  Medications: I have reviewed the patient's current medications.  Assessment/Plan:  61 year old gentleman with the following issues:  1. Advanced bladder cancer with  metastatic disease to the bone, liver as well as local invasion. His bladder cancer has been chemotherapy resistant and spread despite aggressive systemic chemotherapy. He is currently receiving palliative radiation therapy to improve his pain and disease control.  I recommended continuing radiation therapy for palliative purposes and control his local symptoms related to the cancer. I do not believe he will be a candidate to receive any further systemic therapy given his overall health decline and poor response to previous chemotherapy noted.  2. Sepsis: Appears to be improved at this time with no sepsis symptoms at this time.  3. Pain control: Appears to be adequately controlled with fentanyl oxycodone. IV morphine will be needed.  4. Prognosis: Poor with limited life expectancy. I agree with DO NOT RESUSCITATE CODE STATUS. Any treatment at this time is palliative at best.   LOS: 3 days   [facility] 09/03/2016, 7:47 AM

## 2016-09-03 NOTE — Progress Notes (Signed)
PROGRESS NOTE    Alexander Wolf  [id number] DOB: 1956-07-17 DOA: 08/31/2016 PCP: [name], MD   Brief Narrative:  Alexander Wolf is a 61 y.o. male with medical history significant of metastatic bladder cancer, who presents with the chief complain of chest wall pain. For the last 4 days he has been more confused, weak and disorientated.  On high dose of opiates for pain control. Dose of fentanyl recently increased to 125 mcg but due to confusion patient's wife continue 100 mcg patch. Today patient had significant chest wall pain, dull in nature, with no radiation, worse with movement, associated with diaphoresis, 10/10 in intensity with no improving factors. Mainly localized on the right upper chest. Due to severity of symptoms patient was brought to the hospital for further evaluation.  Last hospitalization on 06/3016, for postobstructive uropathy, had bilateral nephrostomy tubes placed. Tubes are being changed on a regular bases, patient started recently to pass urine through the bladder. Was worked up and found to be septic and suspected from [place]. Currently being treated for UTI. Palliative Care Medicine consulted and Oncology evaluated patient this Am.   Assessment & Plan:   Principal Problem:   Sepsis ([place]) Active Problems:   Essential hypertension   Hyperlipidemia LDL goal <70   Hx of bladder cancer   Malignant neoplasm of posterior wall of urinary bladder (HCC)   Elevated lactic acid level   Anemia of chronic disease   Neoplasm related pain   Severe sepsis (HCC)   UTI (urinary tract infection)  Sepsis likely 2/2 to Urinary Tract Infection, improving  -Patient was Tachycardic, Tachypenic and Confused on Admission with suspected source of infection being urine -S/P Fluid Boluses of 3.5 Liters; Maintenance Fluid now at 100 mL/hr -Urinalysis showed Many Bacteria, Moderate Leukocytes, Negative Nitrites, and TNTC WBC;  -Urine Cx showed >100,000 Colonies of E. Coli and >100,000  CFU of Group B Strep; E Coli Sensitive to Ceftriaxone -Blood Cx x 2 showed NGTD at 2 Days -C/w IVF at 100 mL/hr and IV Abx with Ceftriaxone 2 gram q24h -Last Hospitalization on 07/12/16 for Postiobstructive Uropathy s/p Bilateral Nephrostomy Tubes -Procalcitonin was 0.56 and Trend Lactic Acid Level were elevated and were 2.2 -Continue to Monitor extremely closely  -Palliative Care to Meet with Family; Appreciated Dr. 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 Massive rapid progression of metastatic disease throughout the liver. Dependent pulmonary atelectasis. Metastatic disease affecting the right second rib with surrounding soft tissue mass. Likely metastatic disease affecting the proximal left tenth rib. Probable metastatic disease within T8, T11 and T12. -Pain Control with Oxycodone 15 mg po q3hPRN; Changed Dilaudid due to increased Pain -Continue Bowel Regimen and Antiemetics -Palliative Care Consulted for Goals of Care and Pain Control; Will meet with patient and wife today.  -Appreciate Dr. Hazeline Junker Recc's- Advised continuing Radiation Tx for Palliative Purposes and control his local symptoms related to cancer however does not feel he  is a candidate to receive any further chemotherapy.   Chest Wall Pain -Likely From Metastasis; CT Chest Angio showed metastatic disease affecting  the Right second rib with surrounding  sof tissue mass. Likely metastatic disease affecting the proximal left tenth rib. -As above; Adjusted Dilauded.    Anemia likley of Chronic Disease from Bladder Cancer -Patient's Hb/Hct went from 9.1/27.0 -> 7.8/23.9 -> 7.5/23.2 -> 7.8/23.9 -Possibly from Cancer in combination with Dilution from Fluid  Hypokalemia -Potassium 3.5 this AM -Replete with 40 mEQ po KCl -Continue to Monitor BMP and Replete as necessary  Hypophosphatemia -Patients Phos Level was 1.5 -Replete with Kphos -Repeat Phos Level in AM  Hypomagnesemia, improved -Mag level was 2.0 -Repeat Mag Level in AM  Constipation with possible leak through Diarrhea -Obtain KUB  Hypertension.  -Lisinopril held on Admission -C/w Metoprolol Tartrate 25 mg po BID  Type 2 Diabetes Mellitus. -Will hold Home Oral hypoglycemic agents -Continue with Sensitive Novolog SSI  -CBG's ranging 101-126  Dyslipidemia.  -Considering patient's condition and poor prognosis Atorvastatin discontinued.   Depression -Continue Cymbalta at 30 mg po Daily   GERD -Continue Pantoprazole 40 mg po Daily  CAD with Hx of CABG x4 in 2005 -Has not followed up with Cardiology for several years -C/w Metoprolol; Atorvastatin D/C'd on Admission  DVT prophylaxis: Heparin 5,000 units sq q8h Code Status: DO NOT RESUSCITATE Family Communication: No family present at bedside Disposition Plan: Pending discussion with Wife  Consultants:   Palliative Care Medicine  Oncology Notified via EPIC  Procedures: None  Antimicrobials:  Anti-infectives    Start     Dose/Rate Route Frequency Ordered Stop   09/01/16 0400  ceFEPIme (MAXIPIME) 2 g in dextrose 5 % 50 mL IVPB  Status:  Discontinued     2 g 100 mL/hr over 30 Minutes Intravenous Every 12 hours 08/31/16 1539 08/31/16 1752   09/01/16 0400  cefTRIAXone (ROCEPHIN) 2 g in dextrose 5 % 50 mL IVPB     2 g 100 mL/hr over 30 Minutes Intravenous Every 24 hours  08/31/16 1807     08/31/16 1515  ceFEPIme (MAXIPIME) 2 g in dextrose 5 % 50 mL IVPB     2 g 100 mL/hr over 30 Minutes Intravenous  Once 08/31/16 1509 08/31/16 1611     Subjective: Seen and examined bedside this AM and was complaining about more pain. No Nausea or Vomiting. Menation is improved.   Objective: Vitals:   09/02/16 0902 09/02/16 1344 09/02/16 2342 09/03/16 0649  BP: 122/86 134/82 126/77 119/82  Pulse: 99 88 98 99  Resp:  20 20 20   Temp:  97.5 F (36.4 C) 98.2 F (36.8 C) 97.7 F (36.5 C)  TempSrc:  Oral Oral Oral  SpO2:  100% 96% 97%  Weight:      Height:        Intake/Output Summary (Last 24 hours) at 09/03/16 0803 Last data filed at 09/03/16 0756  Gross per 24 hour  Intake             2475 ml  Output             3575 ml  Net            -1100 ml   Filed Weights   08/31/16 1254 08/31/16 1529  Weight: 78.9 kg (174 lb) 76.2 kg (168 lb)   Examination: Physical Exam:  Constitutional: ill-appearing 61 year old male who appears older than stated age. Currently in NAD Eyes:  Lids and conjunctivae normal,  sclerae anicteric  ENMT: External Ears, Nose appear normal. Grossly normal hearing. Neck: Appears normal, supple, no cervical masses, normal ROM, no appreciable thyromegaly; no visible JVD Respiratory: Clear to auscultation bilaterally, no wheezing, rales, rhonchi or crackles. Normal respiratory effort and patient is not tachypenic. No accessory muscle use.  Chest Wall: Chemoport in Place Cardiovascular: RRR, no murmurs / rubs / gallops. S1 and S2 auscultated. No extremity edema.  Abdomen: Soft, non-tender, non-distended. No masses palpated. No appreciable hepatosplenomegaly. Bowel sounds positive.  GU: Deferred. Has bilateral Nephrostomy tubes that are draining well Musculoskeletal: No clubbing / cyanosis of digits/nails. No joint deformity upper and lower extremities. Skin: No rashes, lesions, ulcers on limited skin eval. No induration; Warm and dry.    Neurologic: CN 2-12 grossly intact with no focal deficits. Sensation intact in all 4 Extremities, Romberg sign cerebellar reflexes not assessed.  Psychiatric: Normal judgment and insight. Alert and awake. Normal mood and appropriate affect.   Data Reviewed: I have personally reviewed following labs and imaging studies  CBC:  Recent Labs Lab 08/31/16 1303 08/31/16 2040 09/01/16 0419 09/02/16 0530 09/03/16 0344  WBC 5.4 3.9* 4.0 3.8* 4.3  NEUTROABS  --   --   --  3.1 3.3  HGB 9.1* 7.6* 7.8* 7.5* 7.8*  HCT 27.0* 23.6* 23.9* 23.2* 23.9*  MCV 83.9 85.8 86.3 86.9 86.6  PLT 122* 95* 96* 80* 85*   Basic Metabolic Panel:  Recent Labs Lab 08/31/16 1303 08/31/16 2040 09/01/16 0419 09/02/16 0530 09/03/16 0344  NA 133*  --  135 136 136  K 3.2*  --  3.4* 3.4* 3.5  CL 96*  --  103 104 103  CO2 20*  --  23 22 22   GLUCOSE 219*  --  87 103* 96  BUN 16  --  10 7 8   CREATININE 1.12 0.87 0.83 0.81 0.70  CALCIUM 6.7*  --  6.2* 6.2* 6.6*  MG  --   --   --  1.2* 2.0  PHOS  --   --   --  1.8* 1.5*   GFR: Estimated Creatinine Clearance: 104.6 mL/min (by C-G formula based on SCr of 0.7 mg/dL). Liver Function Tests:  Recent Labs Lab 08/31/16 1303 09/01/16 0419 09/02/16 0530 09/03/16 0344  AST 36 37 37 41  ALT 13* 10* 11* 12*  ALKPHOS 277* 230* 265* 289*  BILITOT 0.6 0.5 0.8 0.5  PROT 7.1 5.9* 6.0* 6.3*  ALBUMIN 2.9* 2.3* 2.2* 2.2*   No results for input(s): LIPASE, AMYLASE in the last 168 hours. No results for input(s): AMMONIA in the last 168 hours. Coagulation Profile: No results for input(s): INR, PROTIME in the last 168 hours. Cardiac Enzymes: No results for input(s): CKTOTAL, CKMB, CKMBINDEX, TROPONINI in the last 168 hours. BNP (last 3 results) No results for input(s): PROBNP in the last 8760 hours. HbA1C: No results for input(s): HGBA1C in the last 72 hours. CBG:  Recent Labs Lab 09/02/16 0739 09/02/16 1143 09/02/16 1649 09/02/16 2338 09/03/16 0753  GLUCAP  118* 113* 128* 101* 122*   Lipid Profile: No results for input(s): CHOL, HDL, LDLCALC, TRIG, CHOLHDL, LDLDIRECT in the last 72 hours. Thyroid Function Tests: No results for input(s): TSH, T4TOTAL, FREET4, T3FREE, THYROIDAB in the last 72 hours. Anemia Panel: No results for input(s): VITAMINB12, FOLATE, FERRITIN, TIBC, IRON, RETICCTPCT in the last 72 hours. Sepsis Labs:  Recent Labs Lab 09/01/16 0000 09/01/16 1050 09/01/16 1500 09/01/16 1620 09/01/16 1815 09/03/16 0344  PROCALCITON  --   --   --  0.56  --  0.62  LATICACIDVEN 2.1* 2.3* 2.3*  --  2.2*  --     Recent Results (from the past 240 hour(s))  Urine culture     Status: Abnormal   Collection Time: 08/31/16  2:45 PM  Result Value Ref Range Status   Specimen Description URINE, CATHETERIZED  Final   Special Requests NONE  Final   Culture (A)  Final    >=100,000 COLONIES/mL ESCHERICHIA COLI >=100,000 COLONIES/mL GROUP B STREP(S.AGALACTIAE)ISOLATED TESTING AGAINST S. AGALACTIAE NOT ROUTINELY PERFORMED DUE TO PREDICTABILITY OF AMP/PEN/VAN SUSCEPTIBILITY. Performed at [facility], [place] [address] [place], [place] [address]    Report Status 09/02/2016 FINAL  Final   Organism ID, Bacteria ESCHERICHIA COLI (A)  Final      Susceptibility   Escherichia coli - MIC*    AMPICILLIN 16 INTERMEDIATE Intermediate     CEFAZOLIN <=4 SENSITIVE Sensitive     CEFTRIAXONE <=1 SENSITIVE Sensitive     CIPROFLOXACIN 1 SENSITIVE Sensitive     GENTAMICIN <=1 SENSITIVE Sensitive     IMIPENEM <=0.25 SENSITIVE Sensitive     NITROFURANTOIN <=16 SENSITIVE Sensitive     TRIMETH/SULFA <=20 SENSITIVE Sensitive     AMPICILLIN/SULBACTAM 4 SENSITIVE Sensitive     PIP/TAZO <=4 SENSITIVE Sensitive     Extended ESBL NEGATIVE Sensitive     * >=100,000 COLONIES/mL ESCHERICHIA COLI  Blood Culture (routine x 2)     Status: None (Preliminary result)   Collection Time: 08/31/16  3:24 PM  Result Value Ref Range Status   Specimen Description BLOOD RIGHT  ANTECUBITAL  Final   Special Requests BOTTLES DRAWN AEROBIC AND ANAEROBIC 5CC  Final   Culture   Final    NO GROWTH 2 DAYS Performed at [facility], [address], [place], [place] [address]    Report Status PENDING  Incomplete  Blood Culture (routine x 2)     Status: None (Preliminary result)   Collection Time: 08/31/16  3:28 PM  Result Value Ref Range Status   Specimen Description BLOOD RIGHT HAND  Final   Special Requests IN PEDIATRIC BOTTLE 4CC  Final   Culture   Final    NO GROWTH 2 DAYS Performed at [facility], [place] [address], [place],  [address]    Report Status PENDING  Incomplete    Radiology Studies: No results found. Scheduled Meds: . cefTRIAXone (ROCEPHIN)  IV  2 g Intravenous Q24H  . DULoxetine  30 mg Oral Daily  . fentaNYL  100 mcg Transdermal Q72H  . heparin  5,000 Units Subcutaneous Q8H  . insulin aspart  0-9 Units Subcutaneous TID WC  . metoprolol  25 mg Oral BID  . pantoprazole  40 mg Oral Daily  . protein supplement  1 scoop Oral TID WC  . protein supplement  8 oz Oral Q24H  . sodium chloride flush  3 mL Intravenous Q12H   Continuous Infusions: . sodium chloride 100 mL/hr at 09/03/16 0332     LOS: 3 days   Kerney Elbe, DO [facility] Pager [phone]  If 7PM-7AM, please contact night-coverage [email or web address] Password [facility] 09/03/2016, 8:03 AM

## 2016-09-03 NOTE — Progress Notes (Signed)
NUTRITION NOTE   Pt seen by RD yesterday for full assessment. Note indicates pt does not like sweet-tasting supplements such as Boost, Ensure, or Boost Breeze so order was placed for [employer] and Beneprotein. RN repots pt refused Beneprotein/did not like it but [place] has not been offered this shift; RN will attempt this later today.   Will d/c Beneprotein and order Premier Protein once/day (as it is not as sweet as other supplements) which will provide 160 kcal and 30 grams of protein.  Pt found to meet criteria for moderate malnutrition in the context of chronic illness. Will continue to follow per protocol.    Jarome Matin, MS, RD, LDN, [facility] Inpatient Clinical Dietitian Pager # [phone] After hours/weekend pager # [phone]

## 2016-09-04 ENCOUNTER — Inpatient Hospital Stay (HOSPITAL_COMMUNITY): Payer: 59

## 2016-09-04 ENCOUNTER — Ambulatory Visit: Payer: 59 | Admitting: Radiation Oncology

## 2016-09-04 ENCOUNTER — Ambulatory Visit: Payer: 59

## 2016-09-04 ENCOUNTER — Ambulatory Visit
Admission: RE | Admit: 2016-09-04 | Discharge: 2016-09-04 | Disposition: A | Discharge status: Home / Self Care | Payer: 59 | Source: Ambulatory Visit | Attending: Radiation Oncology | Admitting: Radiation Oncology

## 2016-09-04 ENCOUNTER — Encounter (HOSPITAL_COMMUNITY): Payer: Self-pay | Admitting: Radiology

## 2016-09-04 DIAGNOSIS — C674 Malignant neoplasm of posterior wall of bladder: Secondary | ICD-10-CM | POA: Diagnosis not present

## 2016-09-04 LAB — GLUCOSE, CAPILLARY
Glucose-Capillary: 100 mg/dL — ABNORMAL HIGH (ref 65–99)
Glucose-Capillary: 113 mg/dL — ABNORMAL HIGH (ref 65–99)
Glucose-Capillary: 107 mg/dL — ABNORMAL HIGH (ref 65–99)

## 2016-09-04 LAB — COMPREHENSIVE METABOLIC PANEL
ALT: 17 U/L (ref 17–63)
ANION GAP: 10 (ref 5–15)
AST: 59 U/L — ABNORMAL HIGH (ref 15–41)
Albumin: 2.3 g/dL — ABNORMAL LOW (ref 3.5–5.0)
Alkaline Phosphatase: 309 U/L — ABNORMAL HIGH (ref 38–126)
BUN: 7 mg/dL (ref 6–20)
CHLORIDE: 104 mmol/L (ref 101–111)
CO2: 23 mmol/L (ref 22–32)
Calcium: 7.1 mg/dL — ABNORMAL LOW (ref 8.9–10.3)
Creatinine, Ser: 0.68 mg/dL (ref 0.61–1.24)
GFR calc non Af Amer: >60 mL/min (ref >60)
Glucose, Bld: 126 mg/dL — ABNORMAL HIGH (ref 65–99)
Potassium: 3.1 mmol/L — ABNORMAL LOW (ref 3.5–5.1)
SODIUM: 137 mmol/L (ref 135–145)
Total Bilirubin: 0.7 mg/dL (ref 0.3–1.2)
Total Protein: 6.2 g/dL — ABNORMAL LOW (ref 6.5–8.1)

## 2016-09-04 LAB — CBC WITH DIFFERENTIAL/PLATELET
BASOS ABS: 0 10*3/uL (ref 0.0–0.1)
Basophils Relative: 0 %
EOS PCT: 1 %
Eosinophils Absolute: 0 10*3/uL (ref 0.0–0.7)
HCT: 22.7 % — ABNORMAL LOW (ref 39.0–52.0)
Hemoglobin: 7.4 g/dL — ABNORMAL LOW (ref 13.0–17.0)
Lymphocytes Relative: 8 %
Lymphs Abs: 0.3 10*3/uL — ABNORMAL LOW (ref 0.7–4.0)
MCH: 27.7 pg (ref 26.0–34.0)
MCHC: 32.6 g/dL (ref 30.0–36.0)
MCV: 85 fL (ref 78.0–100.0)
MONO ABS: 0.3 10*3/uL (ref 0.1–1.0)
Monocytes Relative: 9 %
NEUTROS PCT: 82 %
Neutro Abs: 2.9 10*3/uL (ref 1.7–7.7)
PLATELETS: 66 10*3/uL — AB (ref 150–400)
RBC: 2.67 MIL/uL — AB (ref 4.22–5.81)
RDW: 15.3 % (ref 11.5–15.5)
WBC: 3.5 10*3/uL — AB (ref 4.0–10.5)
nRBC: 1 /100 WBC — ABNORMAL HIGH

## 2016-09-04 LAB — PHOSPHORUS: PHOSPHORUS: 1.7 mg/dL — AB (ref 2.5–4.6)

## 2016-09-04 LAB — CALCIUM, IONIZED: Calcium, Ionized, Serum: 3.8 mg/dL — ABNORMAL LOW (ref 4.5–5.6)

## 2016-09-04 LAB — MAGNESIUM: Magnesium: 1.6 mg/dL — ABNORMAL LOW (ref 1.7–2.4)

## 2016-09-04 MED ORDER — POTASSIUM CHLORIDE CRYS ER 20 MEQ PO TBCR
40.0000 meq | EXTENDED_RELEASE_TABLET | Freq: Two times a day (BID) | ORAL | Status: DC
  Administered 2016-09-04: 40 meq via ORAL
  Filled 2016-09-04: qty 2

## 2016-09-04 MED ORDER — POLYETHYLENE GLYCOL 3350 17 G PO PACK: 17.0000 g | PACK | Freq: Every day | ORAL | 0 refills | Status: AC

## 2016-09-04 MED ORDER — IOPAMIDOL (ISOVUE-300) INJECTION 61%
INTRAVENOUS | Status: AC
  Administered 2016-09-04: 100 mL via INTRAVENOUS
  Filled 2016-09-04: qty 100

## 2016-09-04 MED ORDER — OXYCODONE HCL 15 MG PO TABS
15.0000 mg | ORAL_TABLET | ORAL | 0 refills | Status: AC | PRN
Start: 2016-09-04 — End: ?

## 2016-09-04 MED ORDER — UNJURY CHICKEN SOUP POWDER: 8.0000 [oz_av] | ORAL | 0 refills | Status: AC

## 2016-09-04 MED ORDER — SODIUM CHLORIDE 0.9 % IV SOLN
1.0000 g | Freq: Once | INTRAVENOUS | Status: AC
  Administered 2016-09-04: 1 g via INTRAVENOUS
  Filled 2016-09-04: qty 10

## 2016-09-04 MED ORDER — POLYETHYLENE GLYCOL 3350 17 G PO PACK: 17.0000 g | PACK | Freq: Every day | ORAL | Status: DC

## 2016-09-04 MED ORDER — IOPAMIDOL (ISOVUE-300) INJECTION 61%
INTRAVENOUS | Status: AC
  Filled 2016-09-04: qty 30

## 2016-09-04 MED ORDER — ONDANSETRON HCL 4 MG PO TABS: 4.0000 mg | ORAL_TABLET | Freq: Four times a day (QID) | ORAL | 0 refills | Status: AC | PRN

## 2016-09-04 MED ORDER — AMOXICILLIN-POT CLAVULANATE 875-125 MG PO TABS: 1.0000 | ORAL_TABLET | Freq: Two times a day (BID) | ORAL | Status: DC

## 2016-09-04 MED ORDER — HEPARIN SOD (PORK) LOCK FLUSH 100 UNIT/ML IV SOLN
500.0000 [IU] | INTRAVENOUS | Status: AC | PRN
  Administered 2016-09-04: 500 [IU]

## 2016-09-04 MED ORDER — SENNOSIDES-DOCUSATE SODIUM 8.6-50 MG PO TABS
1.0000 | ORAL_TABLET | Freq: Two times a day (BID) | ORAL | 0 refills | Status: AC
Start: 2016-09-04 — End: ?

## 2016-09-04 MED ORDER — IOPAMIDOL (ISOVUE-300) INJECTION 61%
30.0000 mL | Freq: Once | INTRAVENOUS | Status: AC
  Administered 2016-09-04: 30 mL via ORAL

## 2016-09-04 MED ORDER — AMOXICILLIN-POT CLAVULANATE 875-125 MG PO TABS: 1.0000 | ORAL_TABLET | Freq: Two times a day (BID) | ORAL | 0 refills | Status: AC

## 2016-09-04 MED FILL — ONDANSETRON HCL 4 MG TABLET: 4 | 5 days supply | Qty: 20 | Fill #0

## 2016-09-04 NOTE — Progress Notes (Signed)
Patient discharged home with wife, discharge instructions given and explained to patient/wife and they verbalized understanding. Home health care to be set-up tomorrow by case manager because patient did not want to wait. Accompanied home by wife, transported to the car by staff. No wound noted, Skin intact.

## 2016-09-04 NOTE — Progress Notes (Signed)
Daily Progress Note   Patient Name: Alexander Wolf       Date: 09/04/2016 DOB: 01-Jun-1956  Age: 61 y.o. MRN#: [id number] Attending Physician: Kerney Elbe, DO Primary Care Physician: [name], MD Admit Date: 08/31/2016  Reason for Consultation/Follow-up: Establishing goals of care  Life limiting illness: metastatic bladder cancer.   Subjective:  rested some overnight Wife at bedside this morning See below   Length of Stay: 4  Current Medications: Scheduled Meds:  .  ceFAZolin (ANCEF) IV  1 g Intravenous Q12H  . DULoxetine  30 mg Oral Daily  . fentaNYL  100 mcg Transdermal Q72H  . heparin  5,000 Units Subcutaneous Q8H  . insulin aspart  0-9 Units Subcutaneous TID WC  . iopamidol  30 mL Oral Once  . iopamidol      . metoprolol  25 mg Oral BID  . pantoprazole  40 mg Oral Daily  . protein supplement shake  11 oz Oral Q24H  . protein supplement  8 oz Oral Q24H  . sodium chloride flush  3 mL Intravenous Q12H    Continuous Infusions: . sodium chloride 100 mL/hr at 09/04/16 0911    PRN Meds: acetaminophen **OR** acetaminophen, docusate sodium, HYDROmorphone (DILAUDID) injection, ondansetron **OR** ondansetron (ZOFRAN) IV, oxyCODONE, sodium chloride flush, zolpidem  Physical Exam         NAD  S1 S2 Clear Abdomen soft No edema Awake alert  Vital Signs: BP (!) (P) 131/92 (BP Location: Left Arm)   Pulse (!) (P) 108   Temp (P) 98.9 F (37.2 C) (Oral)   Resp 20   Ht 5\' 11"  (1.803 m)   Wt 76.2 kg (168 lb)   SpO2 (P) 97%   BMI 23.43 kg/m  SpO2: SpO2: (P) 97 % O2 Device: O2 Device: (P) Not Delivered O2 Flow Rate:    Intake/output summary:  Intake/Output Summary (Last 24 hours) at 09/04/16 1009 Last data filed at 09/04/16 0508  Gross per 24 hour  Intake              1680 ml  Output             2445 ml  Net             -765 ml   LBM: Last BM Date: 09/04/16 Baseline Weight: Weight: 78.9 kg (  174 lb) Most recent weight: Weight: 76.2 kg (168 lb)       Palliative Assessment/Data:    Flowsheet Rows   Flowsheet Row Most Recent Value  Intake Tab  Referral Department  Hospitalist  Unit at Time of Referral  Oncology Unit  Palliative Care Primary Diagnosis  Cancer  Date Notified  08/31/16  Palliative Care Type  Return patient Palliative Care  Reason for referral  Pain  Date of Admission  08/31/16  Date first seen by Palliative Care  09/01/16  # of days Palliative referral response time  1 Day(s)  # of days IP prior to Palliative referral  0  Clinical Assessment  Palliative Performance Scale Score  30%  Pain Max last 24 hours  7  Pain Min Last 24 hours  4  Dyspnea Max Last 24 Hours  3  Dyspnea Min Last 24 hours  2  Nausea Max Last 24 Hours  3  Nausea Min Last 24 Hours  2  Psychosocial & Spiritual Assessment  Palliative Care Outcomes  Patient/Family meeting held?  Yes  Who was at the meeting?  patient, wife   Palliative Care Outcomes  Improved pain interventions      Patient Active Problem List   Diagnosis Date Noted  . UTI (urinary tract infection) 09/01/2016  . Severe sepsis ([place]) 08/31/2016  . Sepsis ([place]) 08/31/2016  . Palliative care encounter   . Goals of care, counseling/discussion   . Neoplasm related pain   . Bilateral hydronephrosis   . Metastatic cancer ([place])   . Nonspecific abnormal electrocardiogram (ECG) (EKG) 07/13/2016  . Hyperkalemia 07/13/2016  . Elevated lactic acid level 07/13/2016  . Uremia 07/13/2016  . Acute bilateral obstructive uropathy 07/13/2016  . Anemia of chronic disease 07/13/2016  . Hypocalcemia 07/13/2016  . Acute renal failure ([place]) 07/12/2016  . Port catheter in place 06/05/2016  . Other constipation 05/30/2016  . Malignant neoplasm of posterior wall of urinary bladder ([place]) 04/10/2016  .  Nodular prostate with urinary obstruction 03/30/2016  . BPH (benign prostatic hypertrophy) with urinary retention 03/29/2016  . Coronary artery disease involving coronary bypass graft of native heart without angina pectoris 04/04/2014  . Essential hypertension 04/04/2014  . Hyperlipidemia LDL goal <70 04/04/2014  . Type 2 diabetes mellitus not at goal [facility]) 04/04/2014  . Hx of bladder cancer 04/04/2014    Palliative Care Assessment & Plan   Patient Profile:    Assessment:  metastatic bladder cancer Declining functional status Generalized pain Rapid progression of disease.  Diarrhea  Recommendations/Plan:   Pain regimen discussed with patient and his wife who is an RN: Patient has required 90 mg Oxy IR+ 5 mg IV Dilaudid in the past 24 hours. He is on 100 mcg TDF. Continue to follow along to assess pain regimen recommendations.   Radiation oncology to continue to follow along.   DNR DNI.   Goals of Care and Additional Recommendations:  Limitations on Scope of Treatment: Full Scope Treatment  Code Status:    Code Status Orders        Start     Ordered   08/31/16 1753  Do not attempt resuscitation (DNR)  Continuous    Question Answer Comment  In the event of cardiac or respiratory ARREST Do not call a "code blue"   In the event of cardiac or respiratory ARREST Do not perform Intubation, CPR, defibrillation or ACLS   In the event of cardiac or respiratory ARREST Use medication by any route, position, wound care,  and other measures to relive pain and suffering. May use oxygen, suction and manual treatment of airway obstruction as needed for comfort.      08/31/16 1752    Code Status History    Date Active Date Inactive Code Status Order ID Comments User Context   07/12/2016 11:55 PM 07/19/2016  9:47 PM Full Code [id number]  Karmen Bongo, MD Inpatient   03/29/2016 12:44 PM 03/30/2016  1:42 PM Full Code [id number]  Franchot Gallo, MD Inpatient       Prognosis:    guarded.   Discharge Planning:  To Be Determined  Care plan was discussed with  Patient wife   Thank you for allowing the Palliative Medicine Team to assist in the care of this patient.   Time In:  7.30 Time Out: 8 Total Time 30 Prolonged Time Billed  no       Greater than 50%  of this time was spent counseling and coordinating care related to the above assessment and plan.  Loistine Chance, MD [phone]  Please contact Palliative Medicine Team phone at [phone] for questions and concerns.

## 2016-09-04 NOTE — Progress Notes (Signed)
Phoned floor. Spoke with nurse, Sammuel Hines. Explained radiation transporters will be on the floor at 1230 to pick up the patient and bring him down for radiation therapy. Requested patient be medicated for pain prior to transport. Sammuel Hines, RN verbalized understanding.

## 2016-09-04 NOTE — Progress Notes (Signed)
In the room to discharge patient, patient verbalized the need/assistance getting to the car, wife stated she wanted to ask for home health during doctor's/staff rounds this AM but the patient did not want her to because he does not want any thing to stop him from going home today. Dr. Alfredia Ferguson notified, home health ordered.

## 2016-09-04 NOTE — Progress Notes (Signed)
Palliative Care Progress Note  Reason for Encounter: Goals of care in light of metastatic bladder cancer and poor functional status  I met today with Alexander Wolf and his wife, Alexander Wolf.  Alexander Wolf reports that they met with Dr. Alen Blew this AM and his recommendation was to continue radiation therapy, but he did not feel that Alexander Wolf is a good candidate for further systemic chemotherapy.  Alexander Wolf fully agrees with this, and Alexander Wolf is able to verbalize this as well.  We discussed that the hospital can be useful as long as he is getting well enough from care he receives at the hospital to enjoy his time at home, but we have reached a point where, if his goal is to be at home, he may be better served to plan on being at home and bringing care to him at home rather repeated trips to the hospital. We discussed hospice as a tool that may be beneficial in this goal as we have reached a point where we are trying to fix problems that are not fixable.  Alexander Wolf and Alexander Wolf are in agreement that a good plan would be to plan to take him home with plan to continue radiation therapy.  Once he completes radiation, we discussed enrolling in hospice.  His wife has already called an agency and spoken with them about this.  She is not sure which agency it is, but the number she called is [phone] (I believe this is alternate number for [facility]).  Will ask care management to check in with her to explain process for outpatient referral once radiation completed.    I will also call and talk with radiation therapy tomorrow to let them know of plan and to see if he is best served by current care plan or if this needs to be modified based upon missing appointments and the difficulty he has been having with decreasing functional status.  As always, I am sure rad onc will work to develop best plan to help with his symptoms while keeping his functional limitations in mind.  I just want to ensure that they are up to date with the fact he is having  more difficulty making appointments due to worsening functional status.  I am transitioning off service, but someone from PMT will check in with family to see what questions remain and evaluate his pain control.  TOTAL TIME: 60 minutes Greater than 50%  of this time was spent counseling and coordinating care related to the above assessment and plan.  Micheline Rough, MD [place] Team [phone]

## 2016-09-04 NOTE — Care Management Note (Signed)
Case Management Note  Patient Details  Name: Alexander Wolf MRN: [id number] Date of Birth: 20-Nov-1955  Subjective/Objective:     Admitted with sepsis               Action/Plan: Plan to discharge home with no needs.   Expected Discharge Date:   (unknown)               Expected Discharge Plan:  Home/Self Care  In-House Referral:  NA  Discharge planning Services  CM Consult  Post Acute Care Choice:  Hospice, NA Choice offered to:     DME Arranged:  N/A DME Agency:  NA  HH Arranged:  NA HH Agency:  NA  Status of Service:  In process, will continue to follow  If discussed at Long Length of Stay Meetings, dates discussed:    Additional CommentsPurcell Mouton, RN 09/04/2016, 3:07 PM

## 2016-09-04 NOTE — Discharge Summary (Signed)
Physician Discharge Summary  Alexander Wolf KGY:[id number] DOB: 17-Feb-1956 DOA: 08/31/2016  PCP: [name], MD  Admit date: 08/31/2016 Discharge date: 09/04/2016  Admitted From: Home Disposition:  Home with [place] and then Hospice after Radiation Treatments  Recommendations for Outpatient Follow-up:  1. Follow up with PCP in 1-2 weeks 2. Follow up with Radiation Oncology for palliative Radiation 3. Follow up with Dr. Alen Blew of Oncology to discuss enrolling in Hospice 4. Please obtain BMP/CBC in one week  Home Health: Requested prior to D/C; Order Put in and will notify Case Management as patient did not want to wait Equipment/Devices: None  Discharge Condition: Stable CODE STATUS: DO NOT RESUSCITATE Diet recommendation: Regular Diet  Brief/Interim Summary: Alexander Alberts Kingis a 61 y.o.malewith medical history significant of metastatic bladder cancer, who presents with the chief complain of chest wall pain. For the last 4 days he has been more confused, weak and disorientated. On high dose of opiates for pain control. Dose of fentanyl recently increased to 125 mcg but due to confusion patient's wife continue 100 mcg patch. Today patient had significant chest wall pain, dull in nature, with no radiation, worse with movement, associated with diaphoresis, 10/10 in intensity with no improving factors. Mainly localizedon the right upper chest. Due to severity of symptoms patient was brought to the hospital for further evaluation.  Last hospitalization on 06/3016, for postobstructive uropathy, had bilateral nephrostomy tubes placed. Tubes are being changed on a regular bases, patient started recently to pass urine through the bladder. Was worked up and found to be septic and suspected from [place]. Currently treated for UTI. Palliative Care Medicine consulted and Oncology evaluated patient. Patient's Mentation improved and he was deemed stable to be D/C'd today. Prior to D/C this Afternoon wife  requested Home health. Case Management was contacted but all case Managers had left so order was put in as patient did not want to wait and have his discharge delayed. He will follow up with PCP, Dr. Alen Blew, and Radiation Oncology. Plan is to continue Radiation for palliation, not proceed with any more systemic chemotherapy, and enroll in Hospice after Chemo is completed. Patient wanted to be home today, and as he was medically stable was discharged.   Discharge Diagnoses:  Principal Problem:   Sepsis ([place]) Active Problems:   Essential hypertension   Hyperlipidemia LDL goal <70   Hx of bladder cancer   Malignant neoplasm of posterior wall of urinary bladder (HCC)   Elevated lactic acid level   Anemia of chronic disease   Neoplasm related pain   Severe sepsis (HCC)   UTI (urinary tract infection)  Sepsis likely 2/2 to Urinary Tract Infection, improving  -Patient was Tachycardic, Tachypenic and Confused on Admission with suspected source of infection being urine -S/P Fluid Boluses of 3.5 Liters; Maintenance Fluid now at 100 mL/hr -Urinalysis showed Many Bacteria, Moderate Leukocytes, Negative Nitrites, and TNTC WBC;  -Urine Cx showed >100,000 Colonies of E. Coli and >100,000 CFU of Group B Strep; E Coli Sensitive to Ceftriaxone -Blood Cx x 2 showed NGTD at 2 Days -Changed Antibiotics to Augmentin at discharge- -Procalcitonin was 0.56 and Trend Lactic Acid Level were elevated and were 2.2 -Palliative Care Met with Family and appreciated their recc's -Appreciated Dr. 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 Massive rapid progression of metastatic disease throughout the liver. Dependent pulmonary atelectasis. Metastatic disease affecting the right second rib with surrounding soft tissue mass. Likely metastatic disease affecting the proximal left tenth rib. Probable metastatic disease within T8, T11 and T12. -Pain Control with Oxycodone 15 mg po q3hPRN; Changed Dilaudid due to increased Pain -Continue Bowel Regimen and Antiemetics -Palliative Care Consulted for Goals of Care and Pain Control; Will meet with patient and wife today.  -Appreciate Dr. Hazeline Junker Recc's- Advised continuing Radiation Tx for Palliative Purposes and control his local symptoms related to cancer however does not feel he is a candidate to receive any further chemotherapy.  -CT Abd Pelvis showed Marked interval worsening of metastatic disease throughout the liver, now diffuse  Chest Wall Pain -Likely From Metastasis; CT Chest Angio showed metastatic disease affecting the Right second rib with surrounding  sof tissue mass. Likely metastatic disease affecting the proximal left tenth rib. -As above; Adjusted Pain Regimin -Continue Fenanyl Patch and Oxycodone at D/C  Anemia likley of Chronic Disease from Bladder Cancer -Patient's Hb/Hct went from 9.1/27.0 -> 7.4/22.7 -Possibly from Cancer in combination with Dilution from Fluid  Hypokalemia -Potassium 3.1 this AM -Replete with 40 mEQ po KCl -Continue to Monitor BMP as an outpatient   Hypophosphatemia -Patients Phos Level was 1.7 -Replete with Kphos -Repeat Phos Level as an outpatient   Hypomagnesemia, improved -Mag level was 2.0 -Repeat Mag Level as an outpatient   Constipation with possible leak through Diarrhea? -KUB Reviewed -CT Abd ordered and did not show obstruction; Stomach, large and small bowel  grossly unremarkable -Small Left Perirectal Nodules noted   Hypertension.  -C/w Home Lisinopril and Metoprolol Tartrate 25 mg po BID  Type 2 Diabetes Mellitus. -Continue Home Diabetic medications  Dyslipidemia.  -Consideringpatient's condition and poor prognosis Atorvastatin discontinued.   Depression -Continue Cymbalta at 30 mg po Daily   GERD -Continue Pantoprazole 40 mg po Daily  CAD with Hx of CABG x4 in 2005 -Has not followed up with Cardiology for several years -C/w Metoprolol; Atorvastatin D/C'd on Admission  Discharge Instructions  Discharge Instructions    Call MD for:  difficulty breathing, headache or visual disturbances    Complete by:  As directed    Call MD for:  extreme fatigue    Complete by:  As directed    Call MD for:  persistant dizziness or light-headedness    Complete by:  As directed    Call MD for:  persistant nausea and vomiting    Complete by:  As directed    Call MD for:  severe uncontrolled pain    Complete by:  As directed    Call MD for:  temperature >100.4    Complete by:  As directed    Diet - low sodium heart healthy    Complete by:  As directed    Discharge instructions    Complete by:  As directed    Continue Palliative Radiation Treatments. Follow up with Dr. Alen Blew as an outpatient. Take all medications as prescribed. If Symptoms worsen please go to ED for Evaluation.   Increase activity slowly    Complete by:  As directed      Allergies as  of 09/04/2016   No Known Allergies     Medication List    STOP taking these medications   atorvastatin 40 MG tablet Commonly known as:  LIPITOR   docusate sodium 100 MG capsule Commonly known as:  COLACE   lidocaine 5 % ointment Commonly known as:  XYLOCAINE     TAKE these medications   amoxicillin-clavulanate 875-125 MG tablet Commonly known as:  AUGMENTIN Take 1 tablet by mouth every 12 (twelve) hours.   blood glucose meter kit and supplies Kit Dispense based on  patient and insurance preference. Use up to four times daily as directed. (FOR ICD-9 250.00, 250.01).   DULoxetine 30 MG capsule Commonly known as:  CYMBALTA Take 1 capsule (30 mg total) by mouth daily.   fentaNYL 100 MCG/HR Commonly known as:  DURAGESIC - dosed mcg/hr Place 1 patch (100 mcg total) onto the skin every 3 (three) days.   fentaNYL 25 MCG/HR patch Commonly known as:  DURAGESIC - dosed mcg/hr Place 1 patch (25 mcg total) onto the skin every 3 (three) days.   glipiZIDE 5 MG tablet Commonly known as:  GLUCOTROL TAKE 1 TABLET BY MOUTH TWICE DAILY BEFORE A MEAL   lidocaine-prilocaine cream Commonly known as:  EMLA Apply to port before chemotherapy.   lisinopril 20 MG tablet Commonly known as:  PRINIVIL,ZESTRIL   metoprolol 50 MG tablet Commonly known as:  LOPRESSOR TAKE 1/2 TABLET BY MOUTH TWICE DAILY   omeprazole 40 MG capsule Commonly known as:  PRILOSEC TAKE 1 CAPSULE BY MOUTH EVERY DAY   ondansetron 4 MG tablet Commonly known as:  ZOFRAN Take 1 tablet (4 mg total) by mouth every 6 (six) hours as needed for nausea.   oxyCODONE 15 MG immediate release tablet Commonly known as:  ROXICODONE Take 1-2 tablets (15-30 mg total) by mouth every 3 (three) hours as needed for moderate pain or severe pain. What changed:  how much to take  reasons to take this   polyethylene glycol packet Commonly known as:  MIRALAX / GLYCOLAX Take 17 g by mouth daily.   protein supplement Powd Commonly known as:  UNJURY CHICKEN SOUP Take 27 g (8 oz total) by mouth daily.   senna-docusate 8.6-50 MG tablet Commonly known as:  Senokot-S Take 1 tablet by mouth 2 (two) times daily.   zolpidem 10 MG tablet Commonly known as:  AMBIEN TAKE 1 TABLET BY MOUTH EVERY NIGHT AT BEDTIME AS NEEDED FOR SLEEP       No Known Allergies  Consultations:  Medical Oncology  Radiation Oncology  Palliative Care Medicine  Procedures/Studies: Dg Chest 2 View  Result Date:  08/31/2016 CLINICAL DATA:  61 year old male with chest pain and shortness of Breath starting today. EXAM: CHEST  2 VIEW COMPARISON:  Chest CT 07/26/2016.  Chest x-ray 11/22/2003 FINDINGS: Stable asymmetric elevation right hemidiaphragm. Right Port-A-Cath tip overlies the mid to distal SVC. Patient is status post CABG. Nodular density in the medial left apex likely a confluency shadows given the absence of pulmonary nodule dislocation on recent CT scan. No pulmonary edema. No focal airspace consolidation. No evidence of pleural effusion. The visualized bony structures of the thorax are intact. Telemetry leads overlie the chest. IMPRESSION: Stable.  No acute findings. Electronically Signed   By: Misty Stanley M.D.   On: 08/31/2016 13:38   Dg Abd 1 View  Result Date: 09/03/2016 CLINICAL DATA:  61 year old male with a history of bladder cancer. Constipation. EXAM: ABDOMEN - 1 VIEW COMPARISON:  CT abdomen 07/12/2016.  Fluoroscopic study 08/28/2016 FINDINGS: Gas within stomach, small bowel, colon. Borderline dilated colon loops with no significant rectal gas. Bilateral percutaneous nephrostomy tubes. Degenerative changes of the visualized spine. Degenerative changes of bilateral hips. IMPRESSION: Gas-filled colon with absence of rectal gas. Changes are not specific for bowel obstruction, however, given the history of bladder cancer and the appearance on prior abdominal CT of 07/12/2016, a low threshold for further CT imaging would be suggested if there is concern for acute intra- abdominal process. Bilateral percutaneous nephrostomy tubes. Signed, Dulcy Fanny. Earleen Newport, DO [facility] Radiology Electronically Signed   By: Corrie Mckusick D.O.   On: 09/03/2016 18:51   Ct Head Wo Contrast  Result Date: 08/31/2016 CLINICAL DATA:  Altered mental status and right-sided chest pain. Bladder cancer. On chemotherapy. EXAM: CT HEAD WITHOUT CONTRAST TECHNIQUE: Contiguous axial images  were obtained from the base of the skull through the vertex without intravenous contrast. COMPARISON:  None. FINDINGS: Brain: Mild cerebral atrophy. No mass lesion, hemorrhage, hydrocephalus, acute infarct, intra-axial, or extra-axial fluid collection. Vascular: No hyperdense vessel or unexpected calcification. Skull: Normal Sinuses/Orbits: Normal orbits and globes. Clear paranasal sinuses and mastoid air cells. Other: None IMPRESSION: 1.  No acute intracranial abnormality. 2. Cerebral atrophy. Electronically Signed   By: Abigail Miyamoto M.D.   On: 08/31/2016 14:30   Ct Angio Chest Pe W And/or Wo Contrast  Result Date: 08/31/2016 CLINICAL DATA:  Bladder cancer being treated with chemotherapy. Worsening upper back pain and chest pain. EXAM: CT ANGIOGRAPHY CHEST WITH CONTRAST TECHNIQUE: Multidetector CT imaging of the chest was performed using the standard protocol during bolus administration of intravenous contrast. Multiplanar CT image reconstructions and MIPs were obtained to evaluate the vascular anatomy. CONTRAST:  100 cc Isovue 370 COMPARISON:  Chest radiography same day.  CT 07/26/2016. FINDINGS: Cardiovascular: Previous median sternotomy and CABG. Pulmonary arterial opacification is good. No pulmonary emboli are seen. No acute aortic pathology. No pericardial fluid. Mediastinum/Nodes: No hilar or mediastinal mass or lymphadenopathy. Lungs/Pleura: Newly seen dependent atelectasis in both lungs, particularly in the lower lobes. No evidence of pulmonary metastatic lesion. Mild emphysema at the lung apices. No pleural effusion. Upper Abdomen: Now evident is extensive metastatic disease throughout the liver. This represents massive and rapid progression. 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 Probable early metastatic disease within the T8 vertebral body possibly extending to the right pedicle, within the T11 and T12 vertebral bodies. Minimal superior endplate depression  at T11 and T12. Cannot accurately assess for tumor in the spinal canal. Probable involvement of the proximal left tenth rib. Definite metastatic disease affecting the right second rib with a surrounding soft tissue mass indenting the pleura. Review of the MIP images confirms the above findings. IMPRESSION: No pulmonary emboli or acute vascular pathology. Massive rapid progression of metastatic disease throughout the liver. Dependent pulmonary atelectasis. Metastatic disease affecting the right second rib with surrounding soft tissue mass. Likely metastatic disease affecting the proximal left tenth rib. Probable metastatic disease within T8, T11 and T12. Electronically Signed   By: Nelson Chimes M.D.   On: 08/31/2016 14:39   Ct Abdomen Pelvis W Contrast  Result Date: 09/04/2016 CLINICAL DATA:  Metastatic bladder cancer. Constipation, abdominal pain. EXAM: CT ABDOMEN AND PELVIS WITH CONTRAST TECHNIQUE: Multidetector CT imaging of the abdomen and pelvis was performed using the standard protocol following bolus administration of intravenous contrast. CONTRAST:  100 ISOVUE-300 IOPAMIDOL (ISOVUE-300) INJECTION 61%, 66m ISOVUE-300 IOPAMIDOL (ISOVUE-300) INJECTION 61% COMPARISON:  07/12/2016 FINDINGS: Lower chest: Small bilateral pleural effusions with bibasilar opacities, likely atelectasis. Heart is mildly enlarged. Diffuse coronary artery calcifications. Hepatobiliary: Innumerable low-density lesions throughout the liver, greatly increased in size and number since prior study compatible with diffuse hepatic metastatic disease. Gallbladder unremarkable. Pancreas: No focal abnormality or ductal dilatation. Spleen: No focal abnormality.  Normal size. Adrenals/Urinary Tract: Bilateral nephrostomy catheters in place. No hydronephrosis. Small bilateral renal cysts. Adrenal glands are unremarkable. There is diffuse circumferential wall thickening within the urinary bladder. Focal nodule along the left anterior lower bladder  wall measures up to 2.1 cm. Stomach/Bowel: Stomach, large and small bowel grossly unremarkable. Vascular/Lymphatic: Periaortic adenopathy noted along the upper to mid abdominal aorta including a 80 aortocaval node with a short axis diameter of 16 mm and a left periaortic node with a short axis diameter of 12 mm. These have enlarged since prior study. No evidence of aortic aneurysm. Reproductive: Prostate is indistinct, cannot exclude tumor involvement. Other: Left pelvic mass has decreased in size measuring 6.2 x 3.9 cm compared with 11.1 x 6.5 cm previously. There are perirectal nodules on image 82, the largest measuring up to 14 mm. Musculoskeletal: No acute bony abnormality. IMPRESSION: Marked interval worsening of metastatic disease throughout the liver, now diffuse. Circumferential wall thickening of the bladder wall. There is 2.1 cm enhancing nodule along the anterior lower left bladder wall. Left pelvic soft tissue mass, possibly adenopathy has decreased in size since prior study. There are small left perirectal nodules. Enlarging upper to mid abdominal retroperitoneal lymph nodes. Bilateral nephrostomy tubes in place without hydronephrosis. Small bilateral pleural effusions with bibasilar atelectasis. Electronically Signed   By: Rolm Baptise M.D.   On: 09/04/2016 13:27   Ir Nephrostomy Exchange Left  Result Date: 08/28/2016 INDICATION: History of rapidly progressive bladder cancer, post the emergent placement of bilateral percutaneous nephrostomy catheters on 07/12/2016. Patient returns today for routine fluoroscopic guided nephrostomy catheter exchange. EXAM: FLUOROSCOPIC GUIDED BILATERAL SIDED NEPHROSTOMY CATHETER EXCHANGE COMPARISON:  CT abdomen and pelvis - 07/12/2016; ultrasound and fluoroscopic guided bilateral percutaneous nephrostomy catheter placement - 07/12/2016 CONTRAST:  A total of 20 mL Isovue-300 administered was administered into both collecting systems FLUOROSCOPY TIME:  1 minute 48  seconds (09.6 mGy) COMPLICATIONS: None immediate. TECHNIQUE: Informed written consent was obtained from the patient after a discussion of the risks, benefits and alternatives to treatment. Questions regarding the procedure were encouraged and answered. A timeout was performed prior to the initiation of the procedure. The bilateral flanks and external portions of existing nephrostomy catheters were prepped and draped in the usual sterile fashion. A sterile drape was applied covering the operative field. Maximum barrier sterile technique with sterile gowns and gloves were used for the procedure. A timeout was performed prior to the initiation of the procedure. A pre procedural spot fluoroscopic image was obtained. Beginning with the left-sided nephrostomy, a small amount of contrast was injected via the existing left-sided nephrostomy catheter demonstrating appropriate positioning within the renal pelvis. The existing nephrostomy catheter was cut and cannulated with a Benson wire which was coiled within the renal pelvis. Under intermittent fluoroscopic guidance, the existing nephrostomy catheter was exchanged for a new 10.2 [place] all-purpose drainage catheter. Limited contrast injection confirmed appropriate positioning within the left renal pelvis and a post exchange fluoroscopic image was obtained. The catheter was locked, secured to the skin with an interrupted suture and reconnected to a gravity bag. The identical repeat procedure was repeated for the contralateral right-sided nephrostomy, ultimately allowing successful exchange of  a new 10.2 [place] all-purpose drainage catheter with end coiled and locked within the right renal pelvis. Dressings were placed. The patient tolerated the above procedures well without immediate postprocedural complication. FINDINGS: The existing nephrostomy catheters are appropriately positioned and functioning. After successful fluoroscopic guided exchange, new bilateral 10.2 French  nephrostomy catheters are coiled and locked within the respective renal pelvises. Contrast injection demonstrates passage of contrast through the ureters to the level of the urinary bladder bilaterally without evidence of ureterectasis or pelvicaliectasis. IMPRESSION: Successful fluoroscopic guided exchange of bilateral 10.2 French percutaneous nephrostomy catheters. PLAN: As the patient is incontinent, the patient's wife wishes to maintain the bilateral nephrostomy catheters to gravity bags in lieu of conversion to either nephroureteral catheters or bilateral double-J ureteral stents. Electronically Signed   By: Sandi Mariscal M.D.   On: 08/28/2016 17:32   Ir Nephrostomy Exchange Right  Result Date: 08/28/2016 INDICATION: History of rapidly progressive bladder cancer, post the emergent placement of bilateral percutaneous nephrostomy catheters on 07/12/2016. Patient returns today for routine fluoroscopic guided nephrostomy catheter exchange. EXAM: FLUOROSCOPIC GUIDED BILATERAL SIDED NEPHROSTOMY CATHETER EXCHANGE COMPARISON:  CT abdomen and pelvis - 07/12/2016; ultrasound and fluoroscopic guided bilateral percutaneous nephrostomy catheter placement - 07/12/2016 CONTRAST:  A total of 20 mL Isovue-300 administered was administered into both collecting systems FLUOROSCOPY TIME:  1 minute 48 seconds (93.2 mGy) COMPLICATIONS: None immediate. TECHNIQUE: Informed written consent was obtained from the patient after a discussion of the risks, benefits and alternatives to treatment. Questions regarding the procedure were encouraged and answered. A timeout was performed prior to the initiation of the procedure. The bilateral flanks and external portions of existing nephrostomy catheters were prepped and draped in the usual sterile fashion. A sterile drape was applied covering the operative field. Maximum barrier sterile technique with sterile gowns and gloves were used for the procedure. A timeout was performed prior to the  initiation of the procedure. A pre procedural spot fluoroscopic image was obtained. Beginning with the left-sided nephrostomy, a small amount of contrast was injected via the existing left-sided nephrostomy catheter demonstrating appropriate positioning within the renal pelvis. The existing nephrostomy catheter was cut and cannulated with a Benson wire which was coiled within the renal pelvis. Under intermittent fluoroscopic guidance, the existing nephrostomy catheter was exchanged for a new 10.2 [place] all-purpose drainage catheter. Limited contrast injection confirmed appropriate positioning within the left renal pelvis and a post exchange fluoroscopic image was obtained. The catheter was locked, secured to the skin with an interrupted suture and reconnected to a gravity bag. The identical repeat procedure was repeated for the contralateral right-sided nephrostomy, ultimately allowing successful exchange of a new 10.2 [place] all-purpose drainage catheter with end coiled and locked within the right renal pelvis. Dressings were placed. The patient tolerated the above procedures well without immediate postprocedural complication. FINDINGS: The existing nephrostomy catheters are appropriately positioned and functioning. After successful fluoroscopic guided exchange, new bilateral 10.2 French nephrostomy catheters are coiled and locked within the respective renal pelvises. Contrast injection demonstrates passage of contrast through the ureters to the level of the urinary bladder bilaterally without evidence of ureterectasis or pelvicaliectasis. IMPRESSION: Successful fluoroscopic guided exchange of bilateral 10.2 French percutaneous nephrostomy catheters. PLAN: As the patient is incontinent, the patient's wife wishes to maintain the bilateral nephrostomy catheters to gravity bags in lieu of conversion to either nephroureteral catheters or bilateral double-J ureteral stents. Electronically Signed   By: Sandi Mariscal M.D.    On: 08/28/2016 17:32    Subjective: Seen and  examined and mentation baseline. Wanted to go home as plan of care was discussed with Palliative Care and Oncology. Wants to continue Palliative radiation and then likely enroll in Hospice.  Discharge Exam: Vitals:   09/04/16 1206 09/04/16 1500  BP: (!) 136/91 128/80  Pulse: (!) 106 88  Resp:  18  Temp:  98.7 F (37.1 C)   Vitals:   09/04/16 0814 09/04/16 0947 09/04/16 1206 09/04/16 1500  BP: 126/82 (!) 131/92 (!) 136/91 128/80  Pulse: (!) 101 (!) 108 (!) 106 88  Resp:    18  Temp: 98.3 F (36.8 C) 98.9 F (37.2 C)  98.7 F (37.1 C)  TempSrc: Oral Oral  Oral  SpO2: 97% 97%  97%  Weight:      Height:       General: Pt is alert, awake, not in acute distress Cardiovascular: RRR, S1/S2 +, no rubs, no gallops Respiratory: CTA bilaterally, no wheezing, no rhonchi Abdominal: Soft, Mildly tender, ND, bowel sounds + Extremities: no edema, no cyanosis GU: Bilateral Nephrostomy tubes noted and draining well  The results of significant diagnostics from this hospitalization (including imaging, microbiology, ancillary and laboratory) are listed below for reference.     Microbiology: Recent Results (from the past 240 hour(s))  Urine culture     Status: Abnormal   Collection Time: 08/31/16  2:45 PM  Result Value Ref Range Status   Specimen Description URINE, CATHETERIZED  Final   Special Requests NONE  Final   Culture (A)  Final    >=100,000 COLONIES/mL ESCHERICHIA COLI >=100,000 COLONIES/mL GROUP B STREP(S.AGALACTIAE)ISOLATED TESTING AGAINST S. AGALACTIAE NOT ROUTINELY PERFORMED DUE TO PREDICTABILITY OF AMP/PEN/VAN SUSCEPTIBILITY. Performed at [facility], [place] [address], [place], [place] [address]    Report Status 09/02/2016 FINAL  Final   Organism ID, Bacteria ESCHERICHIA COLI (A)  Final      Susceptibility   Escherichia coli - MIC*    AMPICILLIN 16 INTERMEDIATE Intermediate     CEFAZOLIN <=4 SENSITIVE Sensitive      CEFTRIAXONE <=1 SENSITIVE Sensitive     CIPROFLOXACIN 1 SENSITIVE Sensitive     GENTAMICIN <=1 SENSITIVE Sensitive     IMIPENEM <=0.25 SENSITIVE Sensitive     NITROFURANTOIN <=16 SENSITIVE Sensitive     TRIMETH/SULFA <=20 SENSITIVE Sensitive     AMPICILLIN/SULBACTAM 4 SENSITIVE Sensitive     PIP/TAZO <=4 SENSITIVE Sensitive     Extended ESBL NEGATIVE Sensitive     * >=100,000 COLONIES/mL ESCHERICHIA COLI  Blood Culture (routine x 2)     Status: None (Preliminary result)   Collection Time: 08/31/16  3:24 PM  Result Value Ref Range Status   Specimen Description BLOOD RIGHT ANTECUBITAL  Final   Special Requests BOTTLES DRAWN AEROBIC AND ANAEROBIC 5CC  Final   Culture   Final    NO GROWTH 4 DAYS Performed at [facility], [address] [place], [place] [address]    Report Status PENDING  Incomplete  Blood Culture (routine x 2)     Status: None (Preliminary result)   Collection Time: 08/31/16  3:28 PM  Result Value Ref Range Status   Specimen Description BLOOD RIGHT HAND  Final   Special Requests IN PEDIATRIC BOTTLE 4CC  Final   Culture   Final    NO GROWTH 4 DAYS Performed at [facility], [place] [address], [place], [place] [address]    Report Status PENDING  Incomplete     Labs: BNP (last 3 results) No results  for input(s): BNP in the last 8760 hours. Basic Metabolic Panel:  Recent Labs Lab 08/31/16 1303 08/31/16 2040 09/01/16 0419 09/02/16 0530 09/03/16 0344 09/04/16 0947  NA 133*  --  135 136 136 137  K 3.2*  --  3.4* 3.4* 3.5 3.1*  CL 96*  --  103 104 103 104  CO2 20*  --  '23 22 22 23  ' GLUCOSE 219*  --  87 103* 96 126*  BUN 16  --  '10 7 8 7  ' CREATININE 1.12 0.87 0.83 0.81 0.70 0.68  CALCIUM 6.7*  --  6.2* 6.2* 6.6* 7.1*  MG  --   --   --  1.2* 2.0 1.6*  PHOS  --   --   --  1.8* 1.5* 1.7*   Liver Function Tests:  Recent Labs Lab 08/31/16 1303 09/01/16 0419 09/02/16 0530 09/03/16 0344 09/04/16 0947  AST 36 37 37 41 59*  ALT 13* 10* 11* 12*  17  ALKPHOS 277* 230* 265* 289* 309*  BILITOT 0.6 0.5 0.8 0.5 0.7  PROT 7.1 5.9* 6.0* 6.3* 6.2*  ALBUMIN 2.9* 2.3* 2.2* 2.2* 2.3*   No results for input(s): LIPASE, AMYLASE in the last 168 hours. No results for input(s): AMMONIA in the last 168 hours. CBC:  Recent Labs Lab 08/31/16 2040 09/01/16 0419 09/02/16 0530 09/03/16 0344 09/04/16 0947  WBC 3.9* 4.0 3.8* 4.3 3.5*  NEUTROABS  --   --  3.1 3.3 2.9  HGB 7.6* 7.8* 7.5* 7.8* 7.4*  HCT 23.6* 23.9* 23.2* 23.9* 22.7*  MCV 85.8 86.3 86.9 86.6 85.0  PLT 95* 96* 80* 85* 66*   Cardiac Enzymes: No results for input(s): CKTOTAL, CKMB, CKMBINDEX, TROPONINI in the last 168 hours. BNP: Invalid input(s): POCBNP CBG:  Recent Labs Lab 09/03/16 1226 09/03/16 1702 09/04/16 0805 09/04/16 1205 09/04/16 1657  GLUCAP 101* 126* 100* 113* 107*   D-Dimer No results for input(s): DDIMER in the last 72 hours. Hgb A1c No results for input(s): HGBA1C in the last 72 hours. Lipid Profile No results for input(s): CHOL, HDL, LDLCALC, TRIG, CHOLHDL, LDLDIRECT in the last 72 hours. Thyroid function studies No results for input(s): TSH, T4TOTAL, T3FREE, THYROIDAB in the last 72 hours.  Invalid input(s): FREET3 Anemia work up No results for input(s): VITAMINB12, FOLATE, FERRITIN, TIBC, IRON, RETICCTPCT in the last 72 hours. Urinalysis    Component Value Date/Time   COLORURINE YELLOW 08/31/2016 1445   APPEARANCEUR TURBID (A) 08/31/2016 1445   LABSPEC 1.014 08/31/2016 1445   PHURINE 5.0 08/31/2016 1445   GLUCOSEU NEGATIVE 08/31/2016 1445   HGBUR MODERATE (A) 08/31/2016 1445   BILIRUBINUR NEGATIVE 08/31/2016 1445   BILIRUBINUR negative 04/07/2016 1446   BILIRUBINUR neg 09/24/2014 0851   KETONESUR 5 (A) 08/31/2016 1445   PROTEINUR 100 (A) 08/31/2016 1445   UROBILINOGEN 0.2 04/07/2016 1446   NITRITE NEGATIVE 08/31/2016 1445   LEUKOCYTESUR MODERATE (A) 08/31/2016 1445   Sepsis Labs Invalid input(s): PROCALCITONIN,  WBC,   LACTICIDVEN Microbiology Recent Results (from the past 240 hour(s))  Urine culture     Status: Abnormal   Collection Time: 08/31/16  2:45 PM  Result Value Ref Range Status   Specimen Description URINE, CATHETERIZED  Final   Special Requests NONE  Final   Culture (A)  Final    >=100,000 COLONIES/mL ESCHERICHIA COLI >=100,000 COLONIES/mL GROUP B STREP(S.AGALACTIAE)ISOLATED TESTING AGAINST S. AGALACTIAE NOT ROUTINELY PERFORMED DUE TO PREDICTABILITY OF AMP/PEN/VAN SUSCEPTIBILITY. Performed at [facility], [place] [address] [place], [place] [address]  Report Status 09/02/2016 FINAL  Final   Organism ID, Bacteria ESCHERICHIA COLI (A)  Final      Susceptibility   Escherichia coli - MIC*    AMPICILLIN 16 INTERMEDIATE Intermediate     CEFAZOLIN <=4 SENSITIVE Sensitive     CEFTRIAXONE <=1 SENSITIVE Sensitive     CIPROFLOXACIN 1 SENSITIVE Sensitive     GENTAMICIN <=1 SENSITIVE Sensitive     IMIPENEM <=0.25 SENSITIVE Sensitive     NITROFURANTOIN <=16 SENSITIVE Sensitive     TRIMETH/SULFA <=20 SENSITIVE Sensitive     AMPICILLIN/SULBACTAM 4 SENSITIVE Sensitive     PIP/TAZO <=4 SENSITIVE Sensitive     Extended ESBL NEGATIVE Sensitive     * >=100,000 COLONIES/mL ESCHERICHIA COLI  Blood Culture (routine x 2)     Status: None (Preliminary result)   Collection Time: 08/31/16  3:24 PM  Result Value Ref Range Status   Specimen Description BLOOD RIGHT ANTECUBITAL  Final   Special Requests BOTTLES DRAWN AEROBIC AND ANAEROBIC 5CC  Final   Culture   Final    NO GROWTH 4 DAYS Performed at [facility], [address] [place], [place] [address]    Report Status PENDING  Incomplete  Blood Culture (routine x 2)     Status: None (Preliminary result)   Collection Time: 08/31/16  3:28 PM  Result Value Ref Range Status   Specimen Description BLOOD RIGHT HAND  Final   Special Requests IN PEDIATRIC BOTTLE 4CC  Final   Culture   Final    NO GROWTH 4 DAYS Performed at [facility],  [place] [address], [place], [place] [address]    Report Status PENDING  Incomplete   Time coordinating discharge: Over 30 minutes  SIGNED:  Kerney Elbe, DO [facility] 09/04/2016, 6:08 PM Pager [phone]  If 7PM-7AM, please contact night-coverage [email or web address] Password [email or web address]

## 2016-09-05 ENCOUNTER — Ambulatory Visit: Payer: 59

## 2016-09-05 ENCOUNTER — Other Ambulatory Visit: Payer: Self-pay | Admitting: Radiation Oncology

## 2016-09-05 ENCOUNTER — Telehealth: Payer: Self-pay | Admitting: Radiation Oncology

## 2016-09-05 DIAGNOSIS — C674 Malignant neoplasm of posterior wall of bladder: Secondary | ICD-10-CM

## 2016-09-05 LAB — CULTURE, BLOOD (ROUTINE X 2)
CULTURE: NO GROWTH
Culture: NO GROWTH

## 2016-09-05 MED FILL — oxyCODONE HCL 15 MG TABS: 15 | 1 days supply | Qty: 30 | Fill #0

## 2016-09-05 MED FILL — AMOX TR-K CLV 875-125 MG TA: 875-125 | 6 days supply | Qty: 12 | Fill #0

## 2016-09-05 NOTE — Progress Notes (Signed)
Spoke with Eustaquio Maize, pt's wife concerning home health. She asked if she could call me back. Waiting for a return call.

## 2016-09-05 NOTE — Progress Notes (Signed)
The patient's wife has called and reports continued decline. The patient is not interested in additional radiotherapy and they request referral to hospice. Orders placed.

## 2016-09-05 NOTE — Progress Notes (Signed)
Spoke back with Alexander Wolf pt's wife concerning home health. She states that she is going to get Hospice.

## 2016-09-05 NOTE — Telephone Encounter (Signed)
Received voicemail message from patient's wife requesting to cancel radiation therapy. Also, wife requested hospice referral. Informed Shona Simpson, PA-C of these finding. Alison placed Hospice referral. Per Dr. Johny Shears order future radiation appointments were cancelled. Spoke with Faith, RT on L2 about dropping EOT. Phoned patient's wife, Alexander Wolf, back. Explained hospice referral has been placed and hospice will be in touch in the next 24 hours. Explained future radiation appointments have been cancelled. She verbalized understanding and expressed appreciation for the "excellent" care her husband received from Dr. Johny Shears staff.

## 2016-09-06 ENCOUNTER — Ambulatory Visit: Payer: 59

## 2016-09-06 ENCOUNTER — Encounter: Payer: Self-pay | Admitting: Radiation Oncology

## 2016-09-06 ENCOUNTER — Ambulatory Visit: Payer: 59 | Admitting: Radiation Oncology

## 2016-09-06 MED FILL — LORazepam 0.5 MG TABS: 0.5 | 7 days supply | Qty: 28 | Fill #0

## 2016-09-06 NOTE — Progress Notes (Signed)
  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 The patient experienced pain throughout treatment. He was hospitalized, and restaging showed new liver metastasis. Accordingly, radiation was transitioned to palliative and the patient completed treatment earlier than expected. He has transitioned to hospice.   Plan: The patient has completed radiation treatment. The patient will follow with Hospice ________________________________  Sheral Apley. Tammi Klippel, M.D.   This document serves as a record of services personally performed by Tyler Pita, MD. It was created on his behalf by Bethann Humble, a trained medical scribe. The creation of this record is based on the scribe's personal observations and the provider's statements to them. This document has been checked and approved by the attending provider.

## 2016-09-07 ENCOUNTER — Ambulatory Visit: Payer: 59

## 2016-09-10 ENCOUNTER — Ambulatory Visit: Payer: 59

## 2016-09-11 ENCOUNTER — Ambulatory Visit: Payer: 59

## 2016-09-12 ENCOUNTER — Ambulatory Visit: Payer: 59

## 2016-09-13 ENCOUNTER — Ambulatory Visit: Payer: 59

## 2016-09-14 ENCOUNTER — Telehealth: Payer: Self-pay | Admitting: Oncology

## 2016-09-14 ENCOUNTER — Ambulatory Visit: Payer: 59

## 2016-09-14 ENCOUNTER — Encounter: Payer: Self-pay | Admitting: *Deleted

## 2016-09-14 NOTE — Telephone Encounter (Signed)
Horris Latino from Hospice called to inform us that Alexander Wolf died 09/15/16 @ 12:15pm at home

## 2016-09-17 ENCOUNTER — Ambulatory Visit: Payer: 59

## 2016-09-18 ENCOUNTER — Ambulatory Visit: Payer: 59

## 2016-09-19 ENCOUNTER — Ambulatory Visit: Payer: 59

## 2016-09-20 ENCOUNTER — Ambulatory Visit: Payer: 59

## 2016-09-21 ENCOUNTER — Ambulatory Visit: Payer: 59

## 2016-09-24 ENCOUNTER — Ambulatory Visit: Payer: 59

## 2016-09-26 ENCOUNTER — Ambulatory Visit: Payer: 59 | Admitting: Oncology

## 2016-09-26 ENCOUNTER — Other Ambulatory Visit: Payer: 59

## 2016-10-09 ENCOUNTER — Other Ambulatory Visit (HOSPITAL_COMMUNITY): Payer: 59

## 2016-10-11 DEATH — deceased

## 2016-11-23 ENCOUNTER — Other Ambulatory Visit: Payer: Self-pay | Admitting: Nurse Practitioner

## 2017-04-30 ENCOUNTER — Telehealth: Payer: Self-pay | Admitting: *Deleted

## 2017-04-30 NOTE — Telephone Encounter (Signed)
04-30-17 fax medical records to [employer]

## 2018-10-28 IMAGING — CT CT CHEST W/ CM
2 of 4 series · 15 of 36 positions shown, 18 images · IV contrast (iopamidol)
Comparison: CT abdomen 06/24/2013; 03/19/2016.

CLINICAL DATA: Patient with history of bladder cancer diagnosed in

EXAM:
CT CHEST WITH CONTRAST
TECHNIQUE: Multidetector CT imaging of the chest was performed during
intravenous contrast administration.
CONTRAST:  60mL [id number] IOPAMIDOL ([id number]) INJECTION 61%

[Series 2: axial st · axial · 0.83mm/px · z∈[+214,+494]mm · 12 of 166 slices shown, 15 images]
[im 13/166  mediastinal]
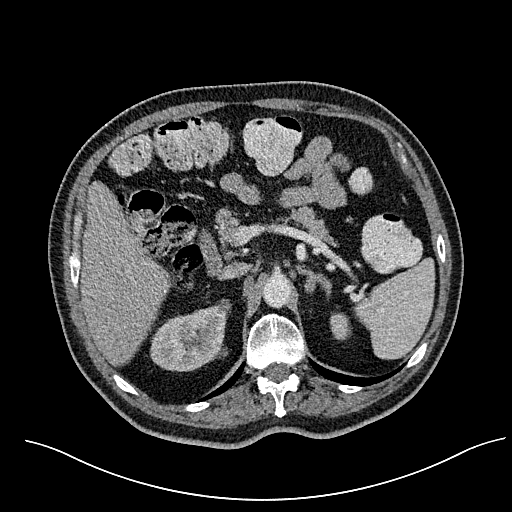
[im 13/166  lung]
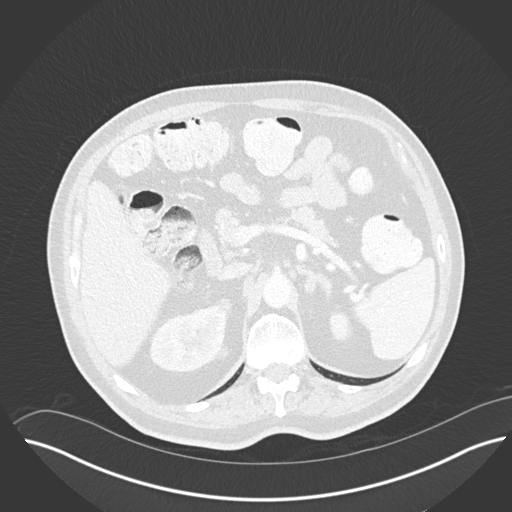
[im 26/166  lung]
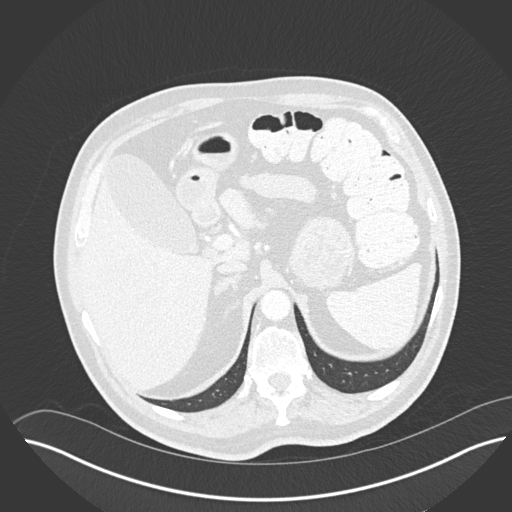
[im 39/166  lung]
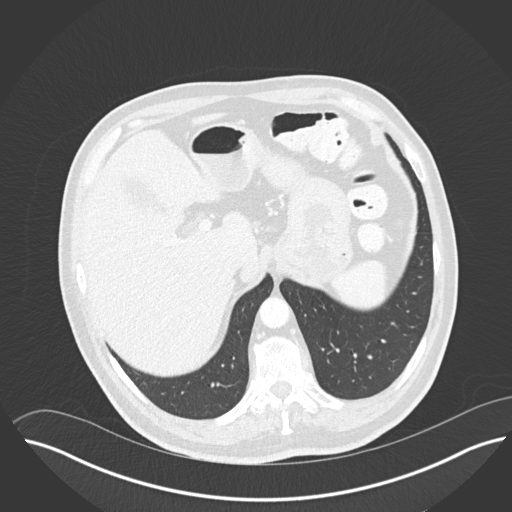
[im 51/166  lung]
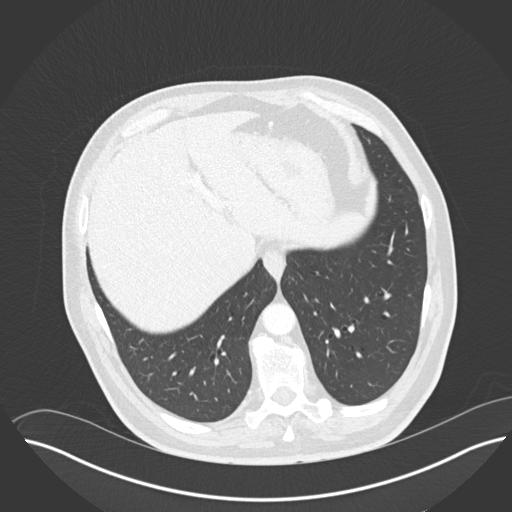
[im 64/166  mediastinal]
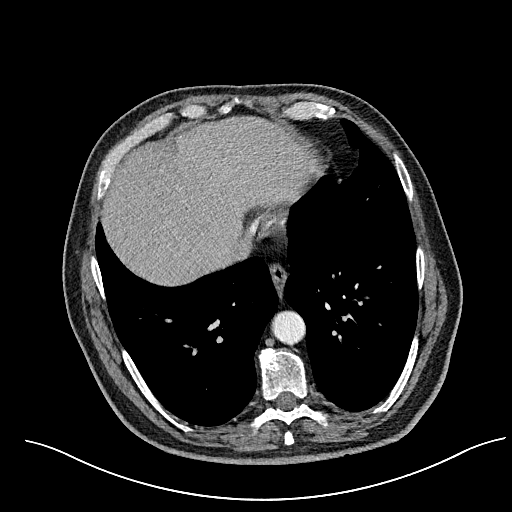
[im 64/166  lung]
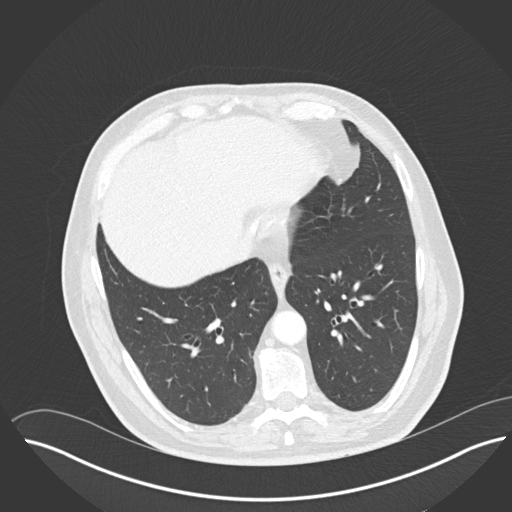
[im 77/166  lung]
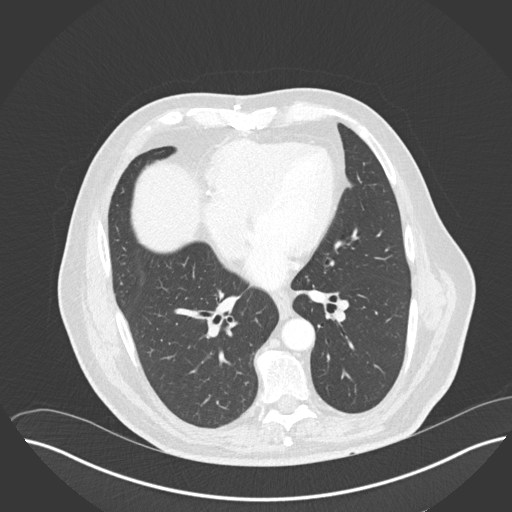
[im 89/166  lung]
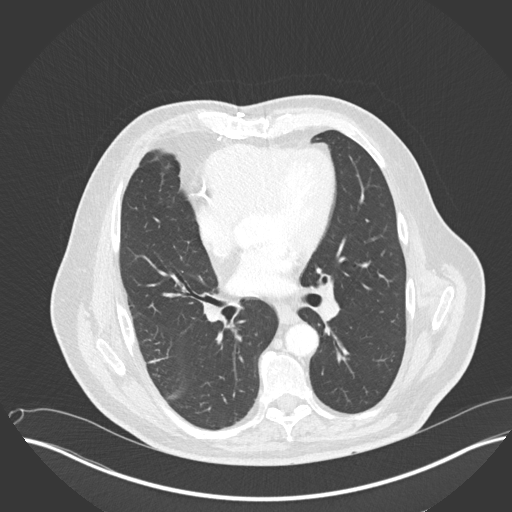
[im 102/166  lung]
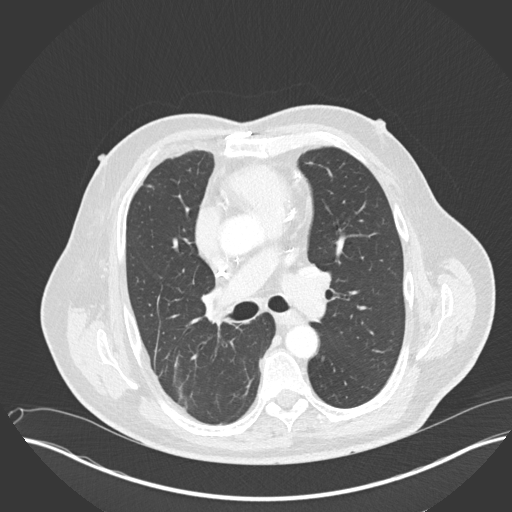
[im 115/166  mediastinal]
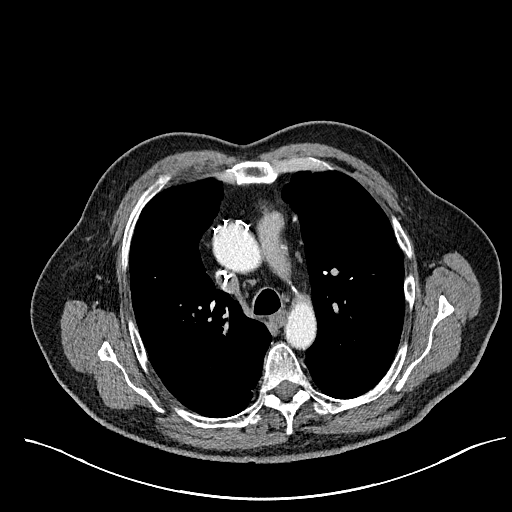
[im 115/166  lung]
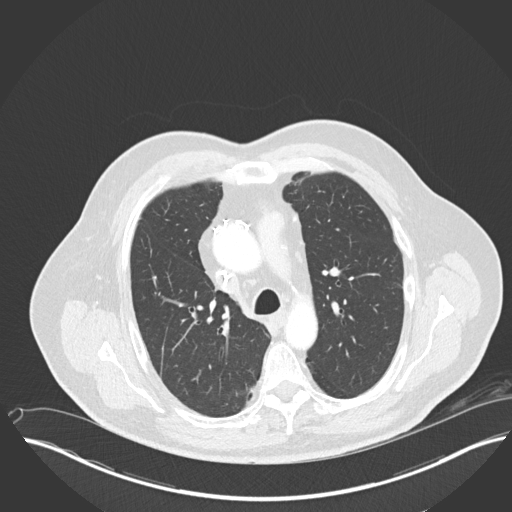
[im 127/166  lung]
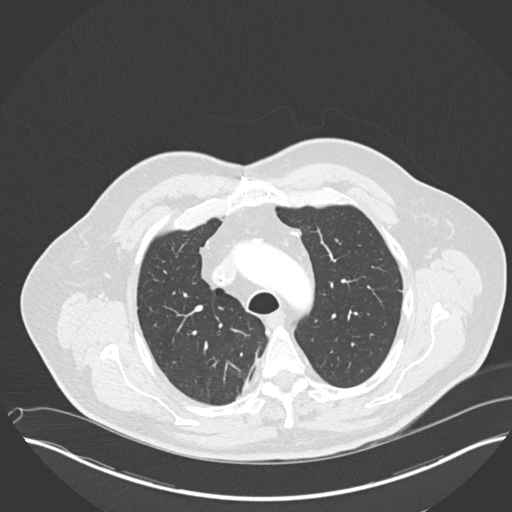
[im 140/166  lung]
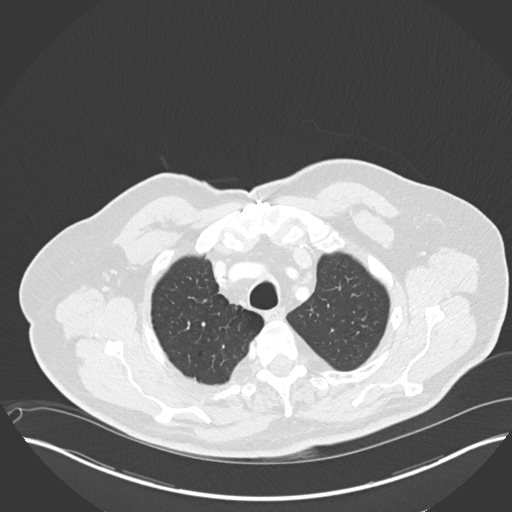
[im 153/166  lung]
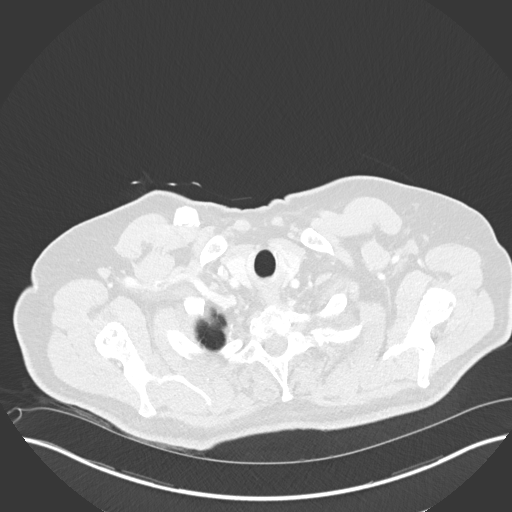

[Series 6: coronal · coronal · 0.66mm/px · 3 of 155 slices shown]
[im 31/155  lung]
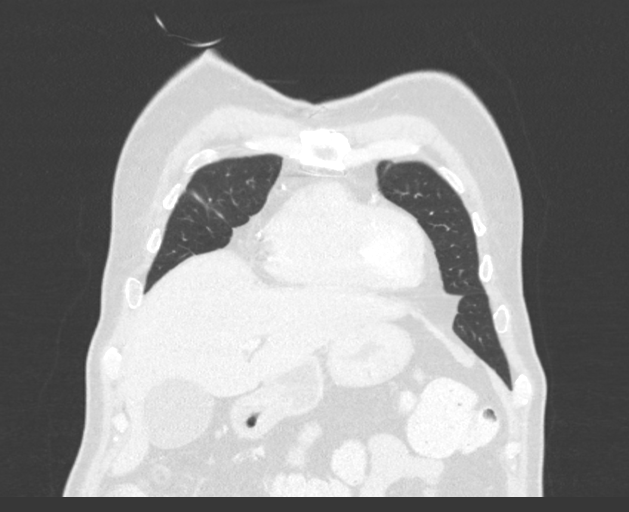
[im 62/155  lung]
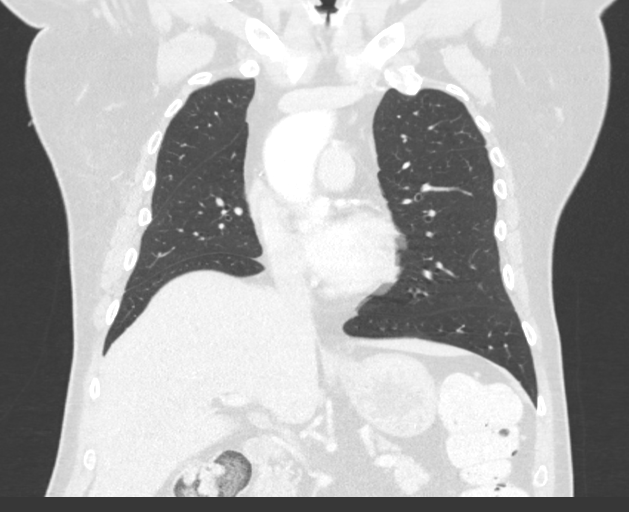
[im 93/155  lung]
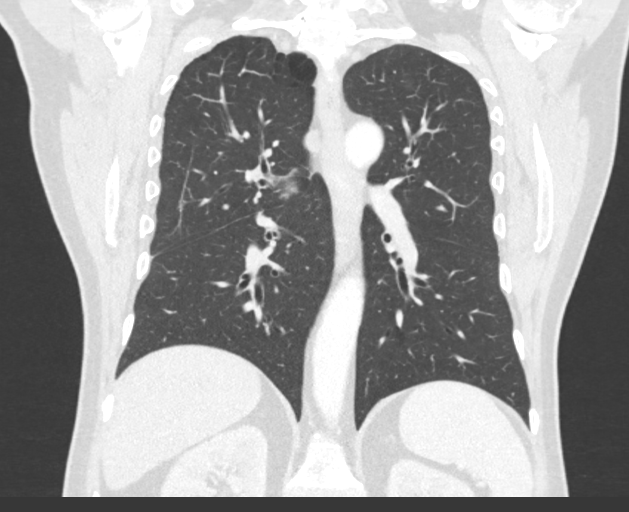

[15 of 36 positions shown; findings below may reference images not displayed]

FINDINGS: Cardiovascular: Right anterior chest wall Port-A-Cath is present
with tip terminating in the superior vena cava. Normal heart size.
No pericardial effusion. Coronary arterial vascular calcifications.

Mediastinum/Nodes: No enlarged axillary, mediastinal or hilar
lymphadenopathy. Esophagus is unremarkable.

Lungs/Pleura: Central airways are patent. 2 mm right upper lobe
pulmonary nodule (image 30; series 5). No consolidative pulmonary
opacities. No pleural effusion or pneumothorax. Right middle lobe
bandlike opacities favored represent atelectasis.

Upper Abdomen: Stable 1.6 cm hepatic cyst right hepatic lobe.
Multiple additional subcentimeter low-attenuation lesions towards
the hepatic dome are grossly stable. Gallbladder is unremarkable.
Small amount a gas within the left renal collecting system, likely
secondary to percutaneous nephrostomy tube. Stable appearing
low-attenuation renal lesions bilaterally.

Musculoskeletal: Thoracic spine degenerative changes. No aggressive
or acute appearing osseous lesions.
IMPRESSION: Patient with history of recurrent bladder carcinoma. No evidence for
metastatic disease to the chest.
